# Patient Record
Sex: Female | Born: 1960 | Race: White | Hispanic: No | Marital: Married | Smoking: Never smoker
Health system: Southern US, Community
[De-identification: ages and names within clinical notes are randomized; demographics above are authoritative.]

## PROBLEM LIST (undated history)

## (undated) DIAGNOSIS — S83209A Unspecified tear of unspecified meniscus, current injury, unspecified knee, initial encounter: Secondary | ICD-10-CM

## (undated) DIAGNOSIS — D649 Anemia, unspecified: Secondary | ICD-10-CM

## (undated) DIAGNOSIS — T7840XA Allergy, unspecified, initial encounter: Secondary | ICD-10-CM

## (undated) DIAGNOSIS — J9611 Chronic respiratory failure with hypoxia: Secondary | ICD-10-CM

## (undated) DIAGNOSIS — K5900 Constipation, unspecified: Secondary | ICD-10-CM

## (undated) DIAGNOSIS — G4700 Insomnia, unspecified: Secondary | ICD-10-CM

## (undated) HISTORY — DX: Constipation, unspecified: K59.00

## (undated) HISTORY — DX: Anemia, unspecified: D64.9

## (undated) HISTORY — DX: Allergy, unspecified, initial encounter: T78.40XA

## (undated) HISTORY — DX: Chronic respiratory failure with hypoxia: J96.11

---

## 1999-05-01 HISTORY — PX: BREAST FIBROADENOMA SURGERY: SHX580

## 1999-11-21 ENCOUNTER — Encounter: Admission: RE | Admit: 1999-11-21 | Discharge: 1999-11-21 | Payer: Self-pay | Admitting: Internal Medicine

## 1999-12-07 ENCOUNTER — Ambulatory Visit (HOSPITAL_COMMUNITY): Admission: RE | Admit: 1999-12-07 | Discharge: 1999-12-07 | Payer: Self-pay | Admitting: General Surgery

## 1999-12-07 ENCOUNTER — Encounter: Payer: Self-pay | Admitting: General Surgery

## 1999-12-07 ENCOUNTER — Encounter (INDEPENDENT_AMBULATORY_CARE_PROVIDER_SITE_OTHER): Payer: Self-pay | Admitting: *Deleted

## 2004-04-30 HISTORY — PX: HYSTEROSCOPY WITH RESECTOSCOPE: SHX5395

## 2005-02-07 ENCOUNTER — Ambulatory Visit (HOSPITAL_COMMUNITY): Admission: RE | Admit: 2005-02-07 | Discharge: 2005-02-07 | Payer: Self-pay | Admitting: Obstetrics and Gynecology

## 2005-02-07 ENCOUNTER — Encounter (INDEPENDENT_AMBULATORY_CARE_PROVIDER_SITE_OTHER): Payer: Self-pay | Admitting: *Deleted

## 2007-05-01 HISTORY — PX: BILATERAL SALPINGOOPHORECTOMY: SHX1223

## 2007-07-31 ENCOUNTER — Ambulatory Visit (HOSPITAL_COMMUNITY): Admission: RE | Admit: 2007-07-31 | Discharge: 2007-08-01 | Payer: Self-pay | Admitting: Obstetrics and Gynecology

## 2007-07-31 ENCOUNTER — Encounter (INDEPENDENT_AMBULATORY_CARE_PROVIDER_SITE_OTHER): Payer: Self-pay | Admitting: Obstetrics and Gynecology

## 2010-09-12 NOTE — Op Note (Signed)
NAME:  CLANCY, LEINER NO.:  [id number]   MEDICAL RECORD NO.:  [id number]          PATIENT TYPE:  AMB   LOCATION:  SDC                           FACILITY:  [facility]   PHYSICIAN:  Miguel Aschoff, M.D.       DATE OF BIRTH:  Sep 28, 1960   DATE OF PROCEDURE:  07/31/2007  DATE OF DISCHARGE:                               OPERATIVE REPORT   PREOPERATIVE DIAGNOSES:  Complex left adnexal cystic mass of the ovary,  elevation of CA-125.   POSTOPERATIVE DIAGNOSES:  Complex left adnexal cystic mass of the ovary,  elevation of CA-125.   PROCEDURE:  Laparoscopic bilateral salpingo-oophorectomy, pelvic  washings.   SURGEON:  Dr. Miguel Aschoff.   ASSISTANT:  Dr. Lodema Hong   ANESTHESIA:  General.   COMPLICATIONS:  None.   BRIEF HISTORY:  The patient is a 50 year old white female who on  evaluation was noted have a mass in the left lower quadrant.  CA-125  test was performed which reveals a value in the 40s which represented  elevation and due to the complex nature of this mass with the  possibility that this represented an ovarian neoplasm, she is being  brought to the operating room at this time to undergo laparoscopy,  pelvic cytology and bilateral oophorectomy to assess the pelvic mass and  the elevation of the CA-125. The patient understands that there is a  chance that a laparotomy would be necessary. The  risks and benefits of  the procedure were discussed, informed consent has been obtained.   PROCEDURE:  The patient was taken to the operating room, placed in a  supine position.  General anesthesia was administered without  difficulty.  She was then placed in the dorsal lithotomy position,  prepped and draped in the usual sterile fashion.  A Foley catheter was  inserted in the bladder. At this point, attention was directed to the  umbilicus where a small infraumbilical incision was made.  A Veress  needle was inserted and then the abdomen was insufflated with 3 liters  of  CO2.  Following the insufflation, a trocar to laparoscope was placed  followed by the laparoscope itself.  Then under direct visualization, a  10-mm port was established in the left lower quadrant and a 5 mm port in  the right lower quadrant. At this point, systematic inspection revealed  the anterior bladder peritoneum to be unremarkable.  The uterus was  noted be top normal size, slightly irregular in shape but otherwise  unremarkable.  The round ligaments were unremarkable.  No hernias were  noted. On the right side, the tube was traced along its course, the tube  appeared to be normal.  The fimbria were fine and delicate.  The right  ovary appeared to be normal except for it appeared to be a simple  follicular cyst.  The ovary was adherent to the right lateral pelvic  sidewall. On the left side the left tube was within normal limits, the  left ovary was noted to be enlarged approximately 4-5 cm in size.  There  were no external excrescences noted on the  surface of the ovary. There  were  periovarian adhesions noted holding this ovary to the lateral  pelvic sidewall and these were taken down without difficulty.  The  intestinal surfaces were unremarkable.  The appendix was visualized and  was noted to be within normal limits.  The liver was noted to have  multiple at adhesions, banjo string type running from the liver surface  to the anterior abdominal wall. This involved the entire anterior  surface of the liver with these adhesions.  No other abnormalities were  noted in the abdomen or pelvis.  The cul-de-sac appeared to be  unremarkable, there was no evidence of any implants found in the cul-de-  sac or on the bowel or omentum.  Pelvic washings were then obtained and  sent for cytology.  At this point, the gyrus unit was introduced.  The  left utero-ovarian ligament was then found, cauterized and cut, the  ovary was then elevated and the left infundibulopelvic ligament was   identified as was the ureter with the ureter being visualized.  The  infundibulopelvic ligament was cauterized and cut and then dissection  continued along the meso-ovarian ligament and mesosalpinx until the left  ovary was freed in its entirety.  The ovaries then placed in the  anterior cul-de-sac of the pelvis and attention was directed to the  opposite side. The adhesions holding the right ovary to the pelvic  sidewall were then lysed, the ovary was then elevated and in a similar  fashion the infundibulopelvic ligament was cauterized and cut and the  dissection continued along the meso-ovarian ligament and mesosalpinx  until the utero-ovarian ligament was found and this was cauterized and  cut as was the residual portion of fallopian tube and the right ovary  and tube were freed and this was placed in the posterior cul-de-sac.  At  this point,  inspection was made for hemostasis and hemostasis appeared  to be excellent.  An EndoCatch system was then placed through the 10-mm  port.  The left ovary was placed in the bag and then brought out through  the left lower quadrant incision. It was then removed in pieces and sent  for histologic study.  The ovary was not ruptured within the abdomen and  was successfully removed from the EndoCatch system without difficulty.  At this point, a second EndoCatch bag was placed into the abdomen and  the right ovary placed in this EndoCatch bag and removed in a similar  fashion.  This was sent as a separate pathologic specimen.  At this  point, a final inspection was made for hemostasis.  Hemostasis appeared  to be excellent and at this point the CO2 was allowed to escape,  instruments were removed and small incisions were closed using  subcuticular 4-0 Vicryl.  The port sites were then injected with a total  of 10 mL of 0.25% Marcaine. This completed the abdominal portion of the  procedure. The patient had requested that several small moles on her   anterior chest between her breasts be removed. The moles were just  approximately 2 mm in size.  These were easily elevated and shaved off  without difficulty and Band-Aids applied. This completed the procedure.  The patient was reversed from the anesthetic and taken to the recovery  room in satisfactory condition.  Estimated blood loss was minimal.   The plan is for the patient to be observed overnight on 23 hours  observation. The patient will be sent home on  April 3 if in satisfactory  condition. Final diagnosis will await the pathology report on the  ovarian specimens.      Miguel Aschoff, M.D.  Electronically Signed     AR/MEDQ  D:  07/31/2007  T:  07/31/2007  Job:  [id number]

## 2010-09-15 NOTE — Op Note (Signed)
[place]  Patient:    Teresa Wells                       MRN: [id number] Proc. Date: 12/07/99 Attending:  Rose Phi. Maple Hudson, M.D.                           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  OPERATION:  Excision of five separate fibroadenomas of the left breast.  SURGEON:  Dr. Rose Phi. Young.  ANESTHESIA:  General.  DESCRIPTION OF PROCEDURE:  This patient had two palpable fibroadenomas and three nonpalpable ones identified by ultrasound.  The palpable ones were at the 9 oclock and 1 oclock positions along the areolar margins, and the nonpalpable ones were at about the 11 oclock position, the 4 oclock position, and the 6 oclock position further out in the breast tissue.  The nonpalpable ones were localized with ultrasound guidance.  After suitable general anesthesia was induced the patient was placed in the supine position, and the left breast was prepped and draped in the usual fashion.  The two palpable ones were excised by making an incision overlying the palpable lesions and dissecting down through the fibroadenoma.  A suture was placed in it in order to lift it up, and then they were simply excised. The nonpalpable ones had been marked with an X for their approximate locations.  So I made an incision overlying the marked spot, and dissected down, grasped the wire, brought it into the incision, lifted up on the wire, and was able to identify the fibroadenomas which were then placed to ______ and excised.  All five of them were removed successfully.  Made good hemostasis with the cautery.  I infiltrated each incision with 1% Xylocaine with Adrenalin.  The skin was then approximated with interrupted 5-0 Monocryl sutures and Steri-Strips.  Dressing was then applied, and the patient was transferred to recovery room in satisfactory  condition, having tolerated the procedure well. DD:  12/07/99 TD:  12/07/99 Job: [id number]

## 2010-09-15 NOTE — Op Note (Signed)
Teresa Wells, Teresa Wells             ACCOUNT NO.:  [id number]   MEDICAL RECORD NO.:  [id number]          PATIENT TYPE:  AMB   LOCATION:  SDC                           FACILITY:  [facility]   PHYSICIAN:  Lenoard Aden, M.D.DATE OF BIRTH:  28-Mar-1961   DATE OF PROCEDURE:  02/07/2005  DATE OF DISCHARGE:                                 OPERATIVE REPORT   PREOPERATIVE DIAGNOSIS:  Menometrorrhagia with questionable structural  lesion.   POSTOPERATIVE DIAGNOSIS:  Probable submucous fibroid.   PROCEDURE:  Diagnostic hysteroscopy, resectoscopic myomectomy, dilatation  and curettage.   SURGEON:  Lenoard Aden, M.D.   ASSISTANT:  None.   ANESTHESIA:  General.   ESTIMATED BLOOD LOSS:  Less than 50 mL.   COMPLICATIONS:  None.   DRAINS:  None.   COUNTS:  Correct.   FLUID DEFICIT:  50 mL.   Patient to recovery in good condition.   BRIEF OPERATIVE NOTE:  After being apprised of the risks of anesthesia,  infection, bleeding, injury to abdominal organs and need for repair, delayed  versus immediate complications to include bowel and bladder injury noted,  the patient was brought to the operating room, where she was administered a  general anesthetic without complications, prepped and draped in the usual  sterile fashion and catheterized until the bladder is empty.  After  achieving adequate anesthesia, a dilute Pitressin solution is placed at 3  and 9 o'clock at the cervicovaginal junction.  At this time the cervix is  easily dilated up to a #32 Pratt dilator, hysteroscope placed.  Visualization reveals a broad-based fibroid versus polyp, which is along the  left posterior lateral wall of the uterus.  The double-angle loop is used to  resect this in its entirety down to the level of the myometrium.  Good  hemostasis is noted.  Tubal ostia are noted to be normal.  There was a small  polypoid fragment along the right lateral wall, which is resected as well.  D&C is  performed.   Endometrial curettings are collected without difficulty.  The  uterus is revisualized and found to be intact, no evidence of any bleeding.  All instruments removed.  The patient is awakened and transferred to  recovery in good condition.      Lenoard Aden, M.D.  Electronically Signed     RJT/MEDQ  D:  02/07/2005  T:  02/07/2005  Job:  [id number]

## 2010-09-15 NOTE — H&P (Signed)
NAMELAURIEL, HELIN             ACCOUNT NO.:  [id number]   MEDICAL RECORD NO.:  [id number]          PATIENT TYPE:  AMB   LOCATION:  SDC                           [facility]ILITY:  [facility]   PHYSICIAN:  Lenoard Aden, M.D.DATE OF BIRTH:  31-Jul-1960   DATE OF ADMISSION:  02/07/2005  DATE OF DISCHARGE:                                HISTORY & PHYSICAL   CHIEF COMPLAINT:  Dysfunction uterine bleeding.   HISTORY OF PRESENT ILLNESS:  She is a 50 year old female, G1, P0, with  irregular bleeding, normal laboratory workup, and a saline sonohysterography  consistent with a probable structural abnormality.   She takes no medications.   ALLERGIES:  1.  SULFA.  2.  PENICILLIN.   She is a nonsmoker, nondrinker.  She denies domestic or physical violence.   She has a history of breast surgery in April 2001.   FAMILY HISTORY:  Diabetes, hypertension, and heart disease.   PHYSICAL EXAMINATION:  GENERAL:  She is a well developed well nourished  female in no apparent distress.  HEENT:  Normal.  LUNGS:  Clear.  HEART:  Regular rhythm.  ABDOMEN:  Soft, nontender.  PELVIC:  Reveals a anteflexed uterus and no adnexal masses.  Multiple small  intramural fibroids are noted.  EXTREMITIES:  Reveal no cords.  NEUROLOGIC:  Nonfocal.   IMPRESSION:  Dysfunction uterine bleeding with structural lesion.   PLAN:  Treat with diagnostic hysteroscopy, resectoscopic myomectomy,  possible __________.  Risks of anesthesia, infection, bleeding, injury  abdominal organs, __________ discussed.  Delayed versus immediate  complications to include bowel and bladder injury noted.  The patient  acknowledges and wishes to proceed.      Lenoard Aden, M.D.  Electronically Signed    RJT/MEDQ  D:  02/06/2005  T:  02/06/2005  Job:  [id number]

## 2010-09-15 NOTE — Discharge Summary (Signed)
Teresa Wells, Teresa Wells             ACCOUNT NO.:  [id number]   MEDICAL RECORD NO.:  [id number]          PATIENT TYPE:  OIB   LOCATION:  9313                          FACILITY:  [facility]   PHYSICIAN:  Miguel Aschoff, M.D.       DATE OF BIRTH:  07-29-1960   DATE OF ADMISSION:  07/31/2007  DATE OF DISCHARGE:  08/01/2007                               DISCHARGE SUMMARY   ADMISSION DIAGNOSIS:  Complex left adnexal mass.   BRIEF HISTORY:  The patient is a 50 year old white female who on  evaluation on gynecologic examination was noted to have a mass in the  left adnexa.  Ultrasound examination was carried out revealing that the  mass was complex in nature and the patient underwent a CA-125 level that  was noted to be slightly elevated due to the nature of this mass in the  elevated CA-125 was felt that laparoscopy was indicated to rule out an  ovarian neoplasm.  She was therefore brought to the hospital to undergo  laparoscopy and laparotomy if indicated.  A prior discussion was carried  out with the patient as to whether in addition to the left ovary, the  right ovary should be removed at the time of surgery and the patient  requested that this be carried out.  Informed consent was obtained.   HOSPITAL COURSE:  Laboratory studies were obtained, her admission  hemoglobin was 15.4, hematocrit 44.9, and white count 7700.  PT and PTT  were within normal limits as was her chemistry profile.  On July 31, 2007, after general anesthesia, laparoscopy was carried out.  She was  noted again to have this complex left adnexal mass and a bilateral  laparoscopic salpingo-oophorectomy was carried out as well as pelvic  washings.  The patient tolerated these surgical procedure well.  She had  an essentially uncomplicated observation course in the hospital with her  hemoglobin nadir at 12.8.  The pathology report on the oophorectomy  specimens revealed benign ovarian follicle cyst, focal tubal  endosalpingiosis,  right tube and ovary showed a benign corpus luteum  cyst.  No evidence of endometriosis.  Pelvic washings were done and a  ThinPrep and cell block revealed reactive mesothelial cells.  No  evidence of malignant cells.   The patient was discharged home.  Her medications for home included  Tylox every 3 hours as needed for pain.  She was instructed no heavy  lifting, place nothing in vagina, and to return to the office in 4 weeks  for followup examination.      Miguel Aschoff, M.D.  Electronically Signed     AR/MEDQ  D:  08/28/2007  T:  08/29/2007  Job:  [id number]

## 2011-01-23 LAB — COMPREHENSIVE METABOLIC PANEL
ALT: 15
AST: 17
Albumin: 4
Alkaline Phosphatase: 54
BUN: 8
CO2: 29
Calcium: 9.3
Chloride: 102
Creatinine, Ser: 0.73
GFR calc Af Amer: >60
GFR calc non Af Amer: >60
Glucose, Bld: 99
Potassium: 4.3
Sodium: 139
Total Bilirubin: 0.9
Total Protein: 6.6

## 2011-01-23 LAB — CBC
HCT: 36.8
HCT: 44.9
Hemoglobin: 12.8
Hemoglobin: 15.4 — ABNORMAL HIGH
MCHC: 34.4
MCV: 92.3
Platelets: 215
Platelets: 248
RBC: 4.86
RDW: 12.7
RDW: 12.9
WBC: 15.6 — ABNORMAL HIGH
WBC: 7.7

## 2011-01-23 LAB — PREGNANCY, URINE: Preg Test, Ur: NEGATIVE

## 2011-01-23 LAB — APTT: aPTT: 26

## 2011-04-30 ENCOUNTER — Encounter (HOSPITAL_BASED_OUTPATIENT_CLINIC_OR_DEPARTMENT_OTHER): Payer: Self-pay | Admitting: *Deleted

## 2011-04-30 NOTE — Progress Notes (Signed)
NPO AFTER MN WITH EXCEPTION CLEAR LIQUIDS (NO CREAM/ MILK PRODUCTS) UNTIL 0930. ARRIVES AT 1130. NEEDS HG.

## 2011-05-04 ENCOUNTER — Encounter (HOSPITAL_BASED_OUTPATIENT_CLINIC_OR_DEPARTMENT_OTHER): Payer: Self-pay | Admitting: Anesthesiology

## 2011-05-04 ENCOUNTER — Encounter (HOSPITAL_BASED_OUTPATIENT_CLINIC_OR_DEPARTMENT_OTHER)
Admission: RE | Disposition: A | Discharge status: Home / Self Care | Payer: Self-pay | Source: Ambulatory Visit | Attending: Specialist

## 2011-05-04 ENCOUNTER — Ambulatory Visit (HOSPITAL_BASED_OUTPATIENT_CLINIC_OR_DEPARTMENT_OTHER): Payer: Worker's Compensation | Admitting: Anesthesiology

## 2011-05-04 ENCOUNTER — Ambulatory Visit (HOSPITAL_BASED_OUTPATIENT_CLINIC_OR_DEPARTMENT_OTHER)
Admission: RE | Admit: 2011-05-04 | Discharge: 2011-05-04 | Disposition: A | Discharge status: Home / Self Care | Payer: Worker's Compensation | Source: Ambulatory Visit | Attending: Specialist | Admitting: Specialist

## 2011-05-04 ENCOUNTER — Encounter (HOSPITAL_BASED_OUTPATIENT_CLINIC_OR_DEPARTMENT_OTHER): Payer: Self-pay | Admitting: *Deleted

## 2011-05-04 DIAGNOSIS — X58XXXA Exposure to other specified factors, initial encounter: Secondary | ICD-10-CM | POA: Insufficient documentation

## 2011-05-04 DIAGNOSIS — Z79899 Other long term (current) drug therapy: Secondary | ICD-10-CM | POA: Insufficient documentation

## 2011-05-04 DIAGNOSIS — S83209A Unspecified tear of unspecified meniscus, current injury, unspecified knee, initial encounter: Secondary | ICD-10-CM

## 2011-05-04 DIAGNOSIS — IMO0002 Reserved for concepts with insufficient information to code with codable children: Secondary | ICD-10-CM | POA: Insufficient documentation

## 2011-05-04 DIAGNOSIS — M1710 Unilateral primary osteoarthritis, unspecified knee: Secondary | ICD-10-CM | POA: Insufficient documentation

## 2011-05-04 HISTORY — PX: MENISCUS DEBRIDEMENT: SHX5178

## 2011-05-04 HISTORY — PX: KNEE ARTHROSCOPY: SHX127

## 2011-05-04 HISTORY — DX: Insomnia, unspecified: G47.00

## 2011-05-04 HISTORY — DX: Unspecified tear of unspecified meniscus, current injury, unspecified knee, initial encounter: S83.209A

## 2011-05-04 SURGERY — ARTHROSCOPY, KNEE
Anesthesia: General | Site: Knee | Laterality: Left | Wound class: Clean

## 2011-05-04 MED ORDER — LIDOCAINE HCL (CARDIAC) 20 MG/ML IV SOLN
INTRAVENOUS | Status: DC | PRN
  Administered 2011-05-04: 80 mg via INTRAVENOUS

## 2011-05-04 MED ORDER — FENTANYL CITRATE 0.05 MG/ML IJ SOLN
25.0000 ug | INTRAMUSCULAR | Status: DC | PRN
  Administered 2011-05-04: 25 ug via INTRAVENOUS

## 2011-05-04 MED ORDER — DEXAMETHASONE SODIUM PHOSPHATE 4 MG/ML IJ SOLN
INTRAMUSCULAR | Status: DC | PRN
  Administered 2011-05-04: 8 mg via INTRAVENOUS

## 2011-05-04 MED ORDER — LACTATED RINGERS IV SOLN
INTRAVENOUS | Status: DC
  Administered 2011-05-04 (×2): via INTRAVENOUS
  Administered 2011-05-04: 100 mL/h via INTRAVENOUS

## 2011-05-04 MED ORDER — CHLORHEXIDINE GLUCONATE 4 % EX LIQD: 60.0000 mL | Freq: Once | CUTANEOUS | Status: DC

## 2011-05-04 MED ORDER — CEFAZOLIN SODIUM 1-5 GM-% IV SOLN
1.0000 g | Freq: Once | INTRAVENOUS | Status: AC
Start: 2011-05-04 — End: 2011-05-04
  Administered 2011-05-04: 1 g via INTRAVENOUS

## 2011-05-04 MED ORDER — ONDANSETRON HCL 4 MG/2ML IJ SOLN
INTRAMUSCULAR | Status: DC | PRN
  Administered 2011-05-04: 4 mg via INTRAVENOUS

## 2011-05-04 MED ORDER — BUPIVACAINE-EPINEPHRINE 0.5% -1:200000 IJ SOLN
INTRAMUSCULAR | Status: DC | PRN
  Administered 2011-05-04: 20 mL

## 2011-05-04 MED ORDER — HYDROCODONE-ACETAMINOPHEN 5-325 MG PO TABS
1.0000 | ORAL_TABLET | Freq: Four times a day (QID) | ORAL | Status: AC | PRN
  Administered 2011-05-04: 1 via ORAL

## 2011-05-04 MED ORDER — ASPIRIN EC 325 MG PO TBEC: 325.0000 mg | DELAYED_RELEASE_TABLET | Freq: Every day | ORAL | Status: AC

## 2011-05-04 MED ORDER — HYDROCODONE-ACETAMINOPHEN 5-325 MG PO TABS: 1.0000 | ORAL_TABLET | ORAL | Status: AC | PRN

## 2011-05-04 MED ORDER — SODIUM CHLORIDE 0.9 % IR SOLN
Status: DC | PRN
  Administered 2011-05-04: 17:00:00

## 2011-05-04 MED ORDER — LACTATED RINGERS IV SOLN: INTRAVENOUS | Status: DC

## 2011-05-04 MED ORDER — FENTANYL CITRATE 0.05 MG/ML IJ SOLN
INTRAMUSCULAR | Status: DC | PRN
  Administered 2011-05-04 (×3): 25 ug via INTRAVENOUS
  Administered 2011-05-04: 50 ug via INTRAVENOUS
  Administered 2011-05-04: 25 ug via INTRAVENOUS

## 2011-05-04 MED ORDER — PROMETHAZINE HCL 25 MG/ML IJ SOLN: 6.2500 mg | INTRAMUSCULAR | Status: DC | PRN

## 2011-05-04 MED ORDER — PROPOFOL 10 MG/ML IV EMUL
INTRAVENOUS | Status: DC | PRN
  Administered 2011-05-04: 200 mg via INTRAVENOUS

## 2011-05-04 SURGICAL SUPPLY — 42 items
BANDAGE ELASTIC 6 VELCRO ST LF (GAUZE/BANDAGES/DRESSINGS) ×2 IMPLANT
BLADE 4.2CUDA (BLADE) IMPLANT
BLADE CUDA SHAVER 3.5 (BLADE) ×2 IMPLANT
BLADE GREAT WHITE 4.2 (BLADE) IMPLANT
BOOTIES KNEE HIGH SLOAN (MISCELLANEOUS) ×2 IMPLANT
CANISTER SUCT LVC 12 LTR MEDI- (MISCELLANEOUS) ×2 IMPLANT
CANISTER SUCTION 1200CC (MISCELLANEOUS) IMPLANT
CANISTER SUCTION 2500CC (MISCELLANEOUS) IMPLANT
CANNULA ACUFLEX KIT 5X76 (CANNULA) IMPLANT
CLOTH BEACON ORANGE TIMEOUT ST (SAFETY) ×2 IMPLANT
CUTTER MENISCUS  4.2MM (BLADE)
CUTTER MENISCUS 4.2MM (BLADE) IMPLANT
DRAPE ARTHROSCOPY W/POUCH 114 (DRAPES) ×2 IMPLANT
DRSG PAD ABDOMINAL 8X10 ST (GAUZE/BANDAGES/DRESSINGS) ×2 IMPLANT
DURAPREP 26ML APPLICATOR (WOUND CARE) ×4 IMPLANT
ELECT REM PT RETURN 9FT ADLT (ELECTROSURGICAL)
ELECTRODE REM PT RTRN 9FT ADLT (ELECTROSURGICAL) IMPLANT
GAUZE SPONGE 4X4 12PLY STRL LF (GAUZE/BANDAGES/DRESSINGS) ×2 IMPLANT
GLOVE BIOGEL M 6.5 STRL (GLOVE) ×2 IMPLANT
GLOVE ECLIPSE 6.0 STRL STRAW (GLOVE) ×2 IMPLANT
GLOVE SURG SS PI 8.0 STRL IVOR (GLOVE) ×2 IMPLANT
GOWN PREVENTION PLUS LG XLONG (DISPOSABLE) ×2 IMPLANT
GOWN STRL REIN XL XLG (GOWN DISPOSABLE) ×2 IMPLANT
IV NS IRRIG 3000ML ARTHROMATIC (IV SOLUTION) ×4 IMPLANT
KNEE WRAP E Z 3 GEL PACK (MISCELLANEOUS) ×2 IMPLANT
MINI VAC (SURGICAL WAND) IMPLANT
NDL SAFETY ECLIPSE 18X1.5 (NEEDLE) ×2 IMPLANT
NEEDLE FILTER BLUNT 18X 1/2SAF (NEEDLE) ×1
NEEDLE FILTER BLUNT 18X1 1/2 (NEEDLE) ×1 IMPLANT
NEEDLE HYPO 18GX1.5 SHARP (NEEDLE) ×2
PACK ARTHROSCOPY DSU (CUSTOM PROCEDURE TRAY) ×2 IMPLANT
PACK BASIN DAY SURGERY FS (CUSTOM PROCEDURE TRAY) ×2 IMPLANT
PADDING CAST COTTON 6X4 STRL (CAST SUPPLIES) ×2 IMPLANT
SET ARTHROSCOPY TUBING (MISCELLANEOUS) ×1
SET ARTHROSCOPY TUBING LN (MISCELLANEOUS) ×1 IMPLANT
SPONGE GAUZE 4X4 12PLY (GAUZE/BANDAGES/DRESSINGS) ×2 IMPLANT
SUT ETHILON 4 0 PS 2 18 (SUTURE) ×2 IMPLANT
SYR 30ML LL (SYRINGE) ×2 IMPLANT
SYRINGE 10CC LL (SYRINGE) IMPLANT
TOWEL OR 17X24 6PK STRL BLUE (TOWEL DISPOSABLE) ×2 IMPLANT
WAND 90 DEG TURBOVAC W/CORD (SURGICAL WAND) IMPLANT
WATER STERILE IRR 500ML POUR (IV SOLUTION) ×2 IMPLANT

## 2011-05-04 NOTE — Anesthesia Procedure Notes (Addendum)
Procedure Name: LMA Insertion Date/Time: 05/04/2011 4:31 PM Performed by: Lorrin Jackson Pre-anesthesia Checklist: Patient identified, Emergency Drugs available, Suction available and Patient being monitored Patient Re-evaluated:Patient Re-evaluated prior to inductionOxygen Delivery Method: Circle System Utilized Preoxygenation: Pre-oxygenation with 100% oxygen Intubation Type: IV induction Ventilation: Mask ventilation without difficulty LMA: LMA inserted LMA Size: 4.0 Number of attempts: 1 Airway Equipment and Method: bite block Placement Confirmation: positive ETCO2 Tube secured with: Tape Dental Injury: Teeth and Oropharynx as per pre-operative assessment  Comments: By Dr. Shireen Quan

## 2011-05-04 NOTE — H&P (Signed)
Teresa Wells is an 51 y.o. female.   Chief Complaint: left knee pain HPI: 50 left knee meniscus tear  Past Medical History  Diagnosis Date  . Insomnia   . Acute meniscal tear of knee LEFT  . Sinus infection 2 WKS AGO--  2 ROUNDS OF ANTIBIOTICS-- STATES NO FEVER  . Nonproductive cough     Past Surgical History  Procedure Date  . Bilateral salpingoophorectomy 2009    LAPAROSCOPIC-- LEFT ADNEXAL MASS  . Hysteroscopy with resectoscope 2006    MYOMECTOMY  . Breast fibroadenoma surgery 2001    LEFT    History reviewed. No pertinent family history. Social History:  reports that she has never smoked. She has never used smokeless tobacco. She reports that she does not drink alcohol or use illicit drugs.  Allergies:  Allergies  Allergen Reactions  . Penicillins Hives  . Sulfa Antibiotics Hives    Medications Prior to Admission  Medication Dose Route Frequency Provider Last Rate Last Dose  . ceFAZolin (ANCEF) IVPB 1 g/50 mL premix  1 g Intravenous Once [employer]      . chlorhexidine (HIBICLENS) 4 % liquid 4 application  60 mL Topical Once [employer]      . lactated ringers infusion   Intravenous Continuous Jill Side, MD 100 mL/hr at 05/04/11 1415 100 mL/hr at 05/04/11 1415   Medications Prior to Admission  Medication Sig Dispense Refill  . meloxicam (MOBIC) 7.5 MG tablet Take 7.5 mg by mouth daily.        . Multiple Vitamin (MULTIVITAMIN) tablet Take 1 tablet by mouth daily.        Marland Kitchen zolpidem (AMBIEN) 10 MG tablet Take 10 mg by mouth at bedtime as needed.          Results for orders placed during the hospital encounter of 05/04/11 (from the past 48 hour(s))  POCT HEMOGLOBIN-HEMACUE     Status: Abnormal   Collection Time   05/04/11  2:16 PM      Component Value Range Comment   Hemoglobin 16.3 (*) 12.0 - 15.0 (g/dL)    No results found.  Review of Systems  Constitutional: Negative.   HENT: Negative.   Eyes: Negative.   Respiratory: Negative.     Cardiovascular: Negative.   Gastrointestinal: Negative.   Musculoskeletal: Positive for joint pain.  Skin: Negative.   Psychiatric/Behavioral: Negative.     Blood pressure 160/94, pulse 70, temperature 97.5 F (36.4 C), temperature source Oral, resp. rate 18, height 5\' 4"  (1.626 m), weight 77.111 kg (170 lb), SpO2 100.00%. Physical Exam  Constitutional: She appears well-developed.  Eyes: Pupils are equal, round, and reactive to light.  Neck: Normal range of motion.  Cardiovascular: Normal rate.   Respiratory: Effort normal.  GI: Soft.  Musculoskeletal: She exhibits edema and tenderness.       Left knee MJLT, LJLT, +effusion.  Neurological: She is alert.  Skin: Skin is warm.  Psychiatric: She has a normal mood and affect.     Assessment/Plan Left knee DJD, meniscus tear. Plan arthroscopy. Risks discussed.  Yaniel Limbaugh C 05/04/2011, 3:04 PM

## 2011-05-04 NOTE — Anesthesia Postprocedure Evaluation (Signed)
  Anesthesia Post-op Note  Patient: Teresa Wells  Procedure(s) Performed:  ARTHROSCOPY KNEE; KNEE ARTHROSCOPY WITH MEDIAL MENISECTOMY - partial; DEBRIDEMENT OF MENISCUS  Patient Location: PACU  Anesthesia Type: General  Level of Consciousness: oriented and sedated  Airway and Oxygen Therapy: Patient Spontanous Breathing  Post-op Pain: mild  Post-op Assessment: Post-op Vital signs reviewed, Patient's Cardiovascular Status Stable, Respiratory Function Stable and Patent Airway  Post-op Vital Signs: stable  Complications: No apparent anesthesia complications

## 2011-05-04 NOTE — Transfer of Care (Signed)
Immediate Anesthesia Transfer of Care Note  Patient: Teresa Wells  Procedure(s) Performed:  ARTHROSCOPY KNEE; KNEE ARTHROSCOPY WITH MEDIAL MENISECTOMY - partial; DEBRIDEMENT OF MENISCUS  Patient Location: Patient transported to PACU with oxygen via face mask at 6 Liters / Min  Anesthesia Type: General  Level of Consciousness: awake and alert   Airway & Oxygen Therapy: Patient Spontanous Breathing and Patient connected to face mask oxygen Post-op Assessment: Report given to PACU RN and Post -op Vital signs reviewed and stable  Post vital signs: Reviewed and stable  Complications: No apparent anesthesia complications

## 2011-05-04 NOTE — Anesthesia Preprocedure Evaluation (Addendum)
Anesthesia Evaluation  Patient identified by MRN, date of birth, ID band Patient awake    Reviewed: Allergy & Precautions, H&P , NPO status , Patient's Chart, lab work & pertinent test results  Airway Mallampati: II TM Distance: >3 FB Neck ROM: full    Dental No notable dental hx. (+) Teeth Intact and Dental Advidsory Given   Pulmonary neg pulmonary ROS, Current Smoker,  clear to auscultation  Pulmonary exam normal       Cardiovascular Exercise Tolerance: Good neg cardio ROS regular Normal    Neuro/Psych Negative Neurological ROS  Negative Psych ROS   GI/Hepatic negative GI ROS, Neg liver ROS,   Endo/Other  Negative Endocrine ROS  Renal/GU negative Renal ROS  Genitourinary negative   Musculoskeletal   Abdominal Normal abdominal exam  (+)   Peds  Hematology negative hematology ROS (+)   Anesthesia Other Findings   Reproductive/Obstetrics negative OB ROS                          Anesthesia Physical Anesthesia Plan  ASA: I  Anesthesia Plan: General LMA   Post-op Pain Management:    Induction:   Airway Management Planned:   Additional Equipment:   Intra-op Plan:   Post-operative Plan:   Informed Consent: I have reviewed the patients History and Physical, chart, labs and discussed the procedure including the risks, benefits and alternatives for the proposed anesthesia with the patient or authorized representative who has indicated his/her understanding and acceptance.   Dental Advisory Given  Plan Discussed with: CRNA  Anesthesia Plan Comments:         Anesthesia Quick Evaluation

## 2011-05-04 NOTE — Brief Op Note (Signed)
05/04/2011  5:10 PM  PATIENT:  Teresa Wells  51 y.o. female  PRE-OPERATIVE DIAGNOSIS:  MENISCAL TEAR LEFT KNEE  POST-OPERATIVE DIAGNOSIS:  MENISCAL TEAR LEFT KNEE  PROCEDURE:  Procedure(s): ARTHROSCOPY KNEE KNEE ARTHROSCOPY WITH MEDIAL MENISECTOMY DEBRIDEMENT OF MENISCUS  SURGEON:  Surgeon(s): Javier Docker  PHYSICIAN ASSISTANT:   ASSISTANTS: none   ANESTHESIA:   general  EBL:  Total I/O In: 1000 [I.V.:1000] Out: -   BLOOD ADMINISTERED:none  DRAINS: none   LOCAL MEDICATIONS USED:  MARCAINE 20CC  SPECIMEN:  No Specimen  DISPOSITION OF SPECIMEN:  N/A  COUNTS:  YES  TOURNIQUET:  * No tourniquets in log *  DICTATION: .Other Dictation: Dictation Number [id number]  PLAN OF CARE: Discharge to home after PACU  PATIENT DISPOSITION:  PACU - hemodynamically stable.   Delay start of Pharmacological VTE agent (>24hrs) due to surgical blood loss or risk of bleeding:  {YES/NO/NOT APPLICABLE:20182

## 2011-05-07 ENCOUNTER — Encounter (HOSPITAL_BASED_OUTPATIENT_CLINIC_OR_DEPARTMENT_OTHER): Payer: Self-pay | Admitting: Specialist

## 2011-05-07 NOTE — Op Note (Signed)
NAMEHumberto Leep NO.:  [id number]  MEDICAL RECORD NO.:  [id number]  LOCATION:                               FACILITY:  [facility]  PHYSICIAN:  Jene Every, M.D.    DATE OF BIRTH:  03/21/1961  DATE OF PROCEDURE:  05/04/2011 DATE OF DISCHARGE:                              OPERATIVE REPORT   PREOPERATIVE DIAGNOSIS:  Medial meniscus tear of the right knee.  POSTOPERATIVE DIAGNOSIS:  Medial meniscus tear of the right knee.  PROCEDURE PERFORMED:  Left knee arthroscopy, partial medial meniscectomy.  ANESTHESIA:  General.  ASSISTANT:  None.  HISTORY:  A pleasant female __________ meniscal tear refractory to conservative treatment, mechanical symptoms, indicated for partial meniscectomy.  Risks and benefits discussed including bleeding, infection, no change in symptoms or worsening symptoms, need for repeat debridement, DVT, PE, anesthetic complications etc.  TECHNIQUE:  With the patient in supine position, after induction of adequate general anesthesia of 1g Kefzol, left lower extremity was prepped and draped in usual sterile fashion.  A lateral parapatellar portal was fashioned with a #11 blade.  Ingress cannula atraumatically placed.  Irrigant was utilized to insufflate the joint.  Under direct visualization, a medial parapatellar portal was fashioned with a #11 blade after localization with an 18-gauge needle sparing the medial meniscus.  __________ complex tearing in the posterior third of the medial meniscus, unstable.  Introduced the basket and resected approximately one-half of the posterior third, further contoured with 3.5 Cuda shaver to a stable base.  Femoral condyle and tibial plateau was unremarkable as was the rest of the meniscus.  ACL was unremarkable as was the PCL.  Lateral compartment revealed normal femoral condyle, tibial plateau, and meniscus stable to probe palpation without evidence of tear.  Suprapatellar pouch revealed normal  patellofemoral tracking, normal cartilage, gutters were unremarkable.  Reexamined the medial compartment.  No further pathology amenable to arthroscopic intervention and therefore, removed all instrumentation.  Portals were closed with 4- 0 nylon simple sutures.  0.25% Marcaine with epinephrine was infiltrated in the joint.  Wound was dressed sterilely,  awoken without difficulty, and transported to the recovery room in satisfactory condition.  Patient tolerated procedure well.  No complications.  No assistant.     Jene Every, M.D.     Cordelia Pen  D:  05/04/2011  T:  05/05/2011  Job:  [id number]

## 2011-09-11 ENCOUNTER — Ambulatory Visit (INDEPENDENT_AMBULATORY_CARE_PROVIDER_SITE_OTHER): Payer: Worker's Compensation | Admitting: Internal Medicine

## 2011-09-11 ENCOUNTER — Encounter: Payer: Self-pay | Admitting: Internal Medicine

## 2011-09-11 VITALS — BP 147/88 | HR 81 | Temp 97.7°F | Ht 62.25 in | Wt 168.0 lb

## 2011-09-11 DIAGNOSIS — S83209A Unspecified tear of unspecified meniscus, current injury, unspecified knee, initial encounter: Secondary | ICD-10-CM

## 2011-09-11 DIAGNOSIS — N951 Menopausal and female climacteric states: Secondary | ICD-10-CM

## 2011-09-11 DIAGNOSIS — R030 Elevated blood-pressure reading, without diagnosis of hypertension: Secondary | ICD-10-CM

## 2011-09-11 DIAGNOSIS — N83209 Unspecified ovarian cyst, unspecified side: Secondary | ICD-10-CM

## 2011-09-11 DIAGNOSIS — N6009 Solitary cyst of unspecified breast: Secondary | ICD-10-CM

## 2011-09-11 DIAGNOSIS — IMO0001 Reserved for inherently not codable concepts without codable children: Secondary | ICD-10-CM

## 2011-09-11 DIAGNOSIS — Z780 Asymptomatic menopausal state: Secondary | ICD-10-CM

## 2011-09-11 DIAGNOSIS — J309 Allergic rhinitis, unspecified: Secondary | ICD-10-CM

## 2011-09-11 DIAGNOSIS — IMO0002 Reserved for concepts with insufficient information to code with codable children: Secondary | ICD-10-CM

## 2011-09-11 LAB — CBC WITH DIFFERENTIAL/PLATELET
Basophils Absolute: 0.1 10*3/uL (ref 0.0–0.1)
Basophils Relative: 1 % (ref 0–1)
Eosinophils Relative: 2 % (ref 0–5)
HCT: 46.2 % — ABNORMAL HIGH (ref 36.0–46.0)
MCHC: 32.9 g/dL (ref 30.0–36.0)
Monocytes Absolute: 0.7 10*3/uL (ref 0.1–1.0)
Neutro Abs: 5 10*3/uL (ref 1.7–7.7)
RDW: 13.4 % (ref 11.5–15.5)

## 2011-09-11 LAB — LIPID PANEL
HDL: 78 mg/dL (ref >39)
LDL Cholesterol: 183 mg/dL — ABNORMAL HIGH (ref 0–99)
Total CHOL/HDL Ratio: 3.6 Ratio
VLDL: 20 mg/dL (ref 0–40)

## 2011-09-11 LAB — COMPREHENSIVE METABOLIC PANEL
AST: 22 U/L (ref 0–37)
Alkaline Phosphatase: 108 U/L (ref 39–117)
BUN: 11 mg/dL (ref 6–23)
Creat: 0.65 mg/dL (ref 0.50–1.10)
Potassium: 4.7 mEq/L (ref 3.5–5.3)
Total Bilirubin: 0.6 mg/dL (ref 0.3–1.2)

## 2011-09-11 LAB — TSH: TSH: 2.844 u[IU]/mL (ref 0.350–4.500)

## 2011-09-11 NOTE — Progress Notes (Signed)
Here to establish care. C/o night sweats , weight gain, fatigue- hoping for rx for these sypmtoms

## 2011-09-11 NOTE — Patient Instructions (Signed)
Schedule CPe  Labs will be mailed to you 

## 2011-09-12 ENCOUNTER — Telehealth: Payer: Self-pay | Admitting: Internal Medicine

## 2011-09-12 ENCOUNTER — Encounter: Payer: Self-pay | Admitting: Internal Medicine

## 2011-09-12 DIAGNOSIS — E785 Hyperlipidemia, unspecified: Secondary | ICD-10-CM | POA: Insufficient documentation

## 2011-09-12 DIAGNOSIS — S83209A Unspecified tear of unspecified meniscus, current injury, unspecified knee, initial encounter: Secondary | ICD-10-CM | POA: Insufficient documentation

## 2011-09-12 DIAGNOSIS — J309 Allergic rhinitis, unspecified: Secondary | ICD-10-CM | POA: Insufficient documentation

## 2011-09-12 DIAGNOSIS — E782 Mixed hyperlipidemia: Secondary | ICD-10-CM | POA: Insufficient documentation

## 2011-09-12 DIAGNOSIS — N83209 Unspecified ovarian cyst, unspecified side: Secondary | ICD-10-CM | POA: Insufficient documentation

## 2011-09-12 NOTE — Telephone Encounter (Signed)
Teresa Wells  Call this pt that i saw yesterday and tell her that I want her to come in for a nurse visit to check her BP as it was elevaated in office.   Have her come on a day when you are here.  I also need Dr. Duane Lope gyn records and I don't think she signced for them  .  She can sign when she comes in to see you for BP  Thanks  xxxooo

## 2011-09-12 NOTE — Telephone Encounter (Signed)
Copy of labs mailed to pt's home address. 

## 2011-09-12 NOTE — Progress Notes (Signed)
Subjective:    Patient ID: Teresa Wells, female    DOB: 05/29/1960, 51 y.o.   MRN: [id number]  HPI New pt here for first visit.  Former care [place] [facility].  PMH of allergic rhinitis, torn L meniscus S/P recent repair, fibrocystic breast per pt. Report.  Bilateral S/O for ovarian cysts.    Beth reports increasing frequency of hot flushes and night sweats awakening her at night.  She reports night sweats q1-2 hours.   She had bilateral S/O in 2008 for ovarian cysts but has noted a worsening of vasomotor symptoms last several months.  She reports flushes are interfering with her quality of life and is interrrupting sleep to a signficant degree  Denies personal of Fh of breast CA or other GYN CA.  No persona of FH of DVT or PE.  Father had a CVA, no history of MI.  She did have an abnormal pap in the distant past but reports that in 2008-2009 pap was normal.  She has not had a recent pap smear and is overdue for her mammogram  Allergies  Allergen Reactions  . Penicillins Hives  . Sulfa Antibiotics Hives   Past Medical History  Diagnosis Date  . Insomnia   . Acute meniscal tear of knee LEFT  . Sinus infection 2 WKS AGO--  2 ROUNDS OF ANTIBIOTICS-- STATES NO FEVER  . Nonproductive cough   . Abnormal Pap smear   . Breast tumor 2009    benign  . Menopause    Past Surgical History  Procedure Date  . Bilateral salpingoophorectomy 2009    LAPAROSCOPIC-- LEFT ADNEXAL MASS  . Hysteroscopy with resectoscope 2006    MYOMECTOMY  . Knee arthroscopy 05/04/2011    Procedure: ARTHROSCOPY KNEE;  Surgeon: Javier Docker;  Location: [facility];  Service: Orthopedics;  Laterality: Left;  . Meniscus debridement 05/04/2011    Procedure: DEBRIDEMENT OF MENISCUS;  Surgeon: Javier Docker;  Location: [facility];  Service: Orthopedics;  Laterality: Left;  . Breast fibroadenoma surgery 2001    LEFT   History   Social History  . Marital Status: Divorced    Spouse  Name: N/A    Number of Children: N/A  . Years of Education: N/A   Occupational History  . Not on file.   Social History Main Topics  . Smoking status: Never Smoker   . Smokeless tobacco: Never Used  . Alcohol Use: Yes     rare beer  . Drug Use: No  . Sexually Active: Yes    Birth Control/ Protection: Surgical   Other Topics Concern  . Not on file   Social History Narrative  . No narrative on file   Family History  Problem Relation Age of Onset  . Diabetes Mother   . Hypertension Mother   . Heart disease Mother   . Diabetes Father   . Hypertension Father   . Stroke Father   . Heart disease Father   . Heart disease Brother   . Hypertension Brother    Patient Active Problem List  Diagnoses  . Breast cyst  . Meniscus tear  . Allergic rhinitis due to allergen  . Menopause  . Menopausal hot flushes  . Elevated BP  . Ovarian cyst   Current Outpatient Prescriptions on File Prior to Visit  Medication Sig Dispense Refill  . cetirizine (ZYRTEC) 5 MG tablet Take 5 mg by mouth daily as needed.      . meloxicam (MOBIC) 7.5 MG  tablet Take 7.5 mg by mouth daily as needed.       . Multiple Vitamin (MULTIVITAMIN) tablet Take 1 tablet by mouth daily.        Marland Kitchen zolpidem (AMBIEN) 10 MG tablet Take 10 mg by mouth at bedtime as needed.            Review of Systems see HPI   Objective:   Physical Exam Physical Exam  Nursing note and vitals reviewed.  Constitutional: She is oriented to person, place, and time. She appears well-developed and well-nourished.  HENT:  Head: Normocephalic and atraumatic.  Cardiovascular: Normal rate and regular rhythm. Exam reveals no gallop and no friction rub.  No murmur heard.  Pulmonary/Chest: Breath sounds normal. She has no wheezes. She has no rales.  Neurological: She is alert and oriented to person, place, and time.  Skin: Skin is warm and dry.  Psychiatric: She has a normal mood and affect. Her behavior is normal. Ext: no edema          Assessment & Plan:  1)  Vasomotor flushes:  Gave risk/benefit and extensive counseling regarding risk/benefit of HT.  Will need to get current mm and pap smear and labs with lipids before initiating HT.  She is to schedule CPE 2)  Elevated BP  Pt states bp usually normal and goes up when her Knee hurts will recheck at nurse visit in a few weeks and at CPE visit 3)  H/o abnormal pap willl need Dr. Charlott Rakes records 4)  Allergic rhinitis

## 2011-09-19 ENCOUNTER — Ambulatory Visit: Payer: 59 | Admitting: *Deleted

## 2011-09-19 VITALS — BP 134/84 | HR 77

## 2011-09-19 DIAGNOSIS — Z7689 Persons encountering health services in other specified circumstances: Secondary | ICD-10-CM

## 2011-09-19 NOTE — Progress Notes (Signed)
Pt here for BP check only  134/84 and p-77.  She is scheduled for a CPE in 2 weeks with Dr Constance Goltz.

## 2011-09-25 ENCOUNTER — Ambulatory Visit (HOSPITAL_BASED_OUTPATIENT_CLINIC_OR_DEPARTMENT_OTHER)
Admission: RE | Admit: 2011-09-25 | Discharge: 2011-09-25 | Disposition: A | Discharge status: Home / Self Care | Payer: 59 | Source: Ambulatory Visit | Attending: Internal Medicine | Admitting: Internal Medicine

## 2011-09-25 DIAGNOSIS — Z1231 Encounter for screening mammogram for malignant neoplasm of breast: Secondary | ICD-10-CM | POA: Insufficient documentation

## 2011-11-05 ENCOUNTER — Encounter: Payer: Worker's Compensation | Admitting: Internal Medicine

## 2011-11-05 ENCOUNTER — Encounter: Payer: Self-pay | Admitting: Internal Medicine

## 2011-11-05 ENCOUNTER — Ambulatory Visit (INDEPENDENT_AMBULATORY_CARE_PROVIDER_SITE_OTHER): Payer: 59 | Admitting: Internal Medicine

## 2011-11-05 VITALS — BP 134/82 | HR 73 | Temp 97.6°F | Resp 16 | Ht 64.0 in | Wt 170.0 lb

## 2011-11-05 DIAGNOSIS — N83209 Unspecified ovarian cyst, unspecified side: Secondary | ICD-10-CM

## 2011-11-05 DIAGNOSIS — Z0000 Encounter for general adult medical examination without abnormal findings: Secondary | ICD-10-CM

## 2011-11-05 DIAGNOSIS — Z01419 Encounter for gynecological examination (general) (routine) without abnormal findings: Secondary | ICD-10-CM

## 2011-11-05 DIAGNOSIS — E785 Hyperlipidemia, unspecified: Secondary | ICD-10-CM

## 2011-11-05 DIAGNOSIS — R030 Elevated blood-pressure reading, without diagnosis of hypertension: Secondary | ICD-10-CM

## 2011-11-05 DIAGNOSIS — N951 Menopausal and female climacteric states: Secondary | ICD-10-CM

## 2011-11-05 DIAGNOSIS — Z23 Encounter for immunization: Secondary | ICD-10-CM

## 2011-11-05 DIAGNOSIS — Z780 Asymptomatic menopausal state: Secondary | ICD-10-CM

## 2011-11-05 DIAGNOSIS — Z124 Encounter for screening for malignant neoplasm of cervix: Secondary | ICD-10-CM

## 2011-11-05 LAB — POCT URINALYSIS DIPSTICK
Blood, UA: NEGATIVE
Protein, UA: NEGATIVE
Spec Grav, UA: <=1.005
Urobilinogen, UA: NEGATIVE

## 2011-11-05 MED ORDER — TETANUS-DIPHTH-ACELL PERTUSSIS 5-2.5-18.5 LF-MCG/0.5 IM SUSP: 0.5000 mL | Freq: Once | INTRAMUSCULAR | Status: DC

## 2011-11-05 MED ORDER — PROGESTERONE MICRONIZED 100 MG PO CAPS: 100.0000 mg | ORAL_CAPSULE | Freq: Every day | ORAL | Status: DC

## 2011-11-05 MED ORDER — ESTRADIOL 0.05 MG/24HR TD PTTW: MEDICATED_PATCH | TRANSDERMAL | Status: DC

## 2011-11-05 NOTE — Patient Instructions (Addendum)
Take meds as prescribed  See me in 3-4 months  Follow dash diet

## 2011-11-05 NOTE — Progress Notes (Signed)
Subjective:    Patient ID: Teresa Wells, female    DOB: August 16, 1960, 51 y.o.   MRN: [id number]  HPI Teresa Wells is here for comprehensive eval.    She still reports day and nightly vasomotor flushes and would like to start HT  See BP improved now.  She denies chest pain, SOB, or palpitations.  She is concerned about weight gain but has not been able to exercise since her knee surgery.    She reports she has not had a Tetanus in many years.  She has never had a colonoscopy  Allergies  Allergen Reactions  . Penicillins Hives  . Sulfa Antibiotics Hives   Past Medical History  Diagnosis Date  . Insomnia   . Acute meniscal tear of knee LEFT  . Sinus infection 2 WKS AGO--  2 ROUNDS OF ANTIBIOTICS-- STATES NO FEVER  . Nonproductive cough   . Abnormal Pap smear   . Breast tumor 2009    benign  . Menopause    Past Surgical History  Procedure Date  . Bilateral salpingoophorectomy 2009    LAPAROSCOPIC-- LEFT ADNEXAL MASS  . Hysteroscopy with resectoscope 2006    MYOMECTOMY  . Knee arthroscopy 05/04/2011    Procedure: ARTHROSCOPY KNEE;  Surgeon: Javier Docker;  Location: [facility];  Service: Orthopedics;  Laterality: Left;  . Meniscus debridement 05/04/2011    Procedure: DEBRIDEMENT OF MENISCUS;  Surgeon: Javier Docker;  Location: [facility];  Service: Orthopedics;  Laterality: Left;  . Breast fibroadenoma surgery 2001    LEFT   History   Social History  . Marital Status: Divorced    Spouse Name: N/A    Number of Children: N/A  . Years of Education: N/A   Occupational History  . Not on file.   Social History Main Topics  . Smoking status: Never Smoker   . Smokeless tobacco: Never Used  . Alcohol Use: Yes     rare beer  . Drug Use: No  . Sexually Active: Yes    Birth Control/ Protection: Surgical   Other Topics Concern  . Not on file   Social History Narrative  . No narrative on file   Family History  Problem Relation Age of Onset    . Diabetes Mother   . Hypertension Mother   . Heart disease Mother   . Diabetes Father   . Hypertension Father   . Stroke Father   . Heart disease Father   . Heart disease Brother   . Hypertension Brother    Patient Active Problem List  Diagnosis  . Breast cyst  . Meniscus tear  . Allergic rhinitis due to allergen  . Menopause  . Menopausal hot flushes  . Elevated BP  . Ovarian cyst  . Hyperlipidemia   Current Outpatient Prescriptions on File Prior to Visit  Medication Sig Dispense Refill  . acetaminophen (TYLENOL) 500 MG tablet Take 500 mg by mouth every 6 (six) hours as needed.      . cetirizine (ZYRTEC) 5 MG tablet Take 5 mg by mouth daily as needed.      . Multiple Vitamin (MULTIVITAMIN) tablet Take 1 tablet by mouth daily.        Marland Kitchen zolpidem (AMBIEN) 10 MG tablet Take 10 mg by mouth at bedtime as needed.        Marland Kitchen estradiol (VIVELLE-DOT) 0.05 MG/24HR Place one patch on skin.  Change twice a week  8 patch  3  . progesterone (PROMETRIUM) 100 MG  capsule Take 1 capsule (100 mg total) by mouth daily.  30 capsule  3       Review of Systems  Constitutional: Negative.   HENT: Negative.   Eyes: Negative.   Respiratory: Negative.   Cardiovascular: Negative.   Gastrointestinal: Negative.   Genitourinary: Negative.   Musculoskeletal: Negative.   Skin: Negative.   Neurological: Negative.   Hematological: Negative.   Psychiatric/Behavioral: Negative.        Objective:   Physical Exam Physical Exam  Vital signs and nursing note reviewed  Constitutional: She is oriented to person, place, and time. She appears well-developed and well-nourished. She is cooperative.  HENT:  Head: Normocephalic and atraumatic.  Right Ear: Tympanic membrane normal.  Left Ear: Tympanic membrane normal.  Nose: Nose normal.  Mouth/Throat: Oropharynx is clear and moist and mucous membranes are normal. No oropharyngeal exudate or posterior oropharyngeal erythema.  Eyes: Conjunctivae and EOM  are normal. Pupils are equal, round, and reactive to light.  Neck: Neck supple. No JVD present. Carotid bruit is not present. No mass and no thyromegaly present.  Cardiovascular: Regular rhythm, normal heart sounds, intact distal pulses and normal pulses.  Exam reveals no gallop and no friction rub.   No murmur heard. Pulses:      Dorsalis pedis pulses are 2+ on the right side, and 2+ on the left side.  Pulmonary/Chest: Breath sounds normal. She has no wheezes. She has no rhonchi. She has no rales. Right breast exhibits no mass, no nipple discharge and no skin change. Left breast exhibits no mass, no nipple discharge and no skin change.  Abdominal: Soft. Bowel sounds are normal. She exhibits no distension and no mass. There is no hepatosplenomegaly. There is no tenderness. There is no CVA tenderness.  Genitourinary: Rectum normal, vagina normal and uterus normal. Rectal exam shows no mass. Guaiac negative stool. No labial fusion. There is no lesion on the right labia. There is no lesion on the left labia. Cervix exhibits no motion tenderness. Right adnexum displays no mass, no tenderness and no fullness. Left adnexum displays no mass, no tenderness and no fullness. No erythema around the vagina.  Musculoskeletal:       No active synovitis to any joint.    Lymphadenopathy:       Right cervical: No superficial cervical adenopathy present.      Left cervical: No superficial cervical adenopathy present.       Right axillary: No pectoral and no lateral adenopathy present.       Left axillary: No pectoral and no lateral adenopathy present.      Right: No inguinal adenopathy present.       Left: No inguinal adenopathy present.  Neurological: She is alert and oriented to person, place, and time. She has normal strength and normal reflexes. No cranial nerve deficit or sensory deficit. She displays a negative Romberg sign. Coordination and gait normal.  Skin: Skin is warm and dry. No abrasion, no bruising,  no ecchymosis and no rash noted. No cyanosis. Nails show no clubbing.  Psychiatric: She has a normal mood and affect. Her speech is normal and behavior is normal.          Assessment & Plan:   Health Maintenance:  Declines colonoscopy referral.  She would like to have this in the fall when her work schedule is not so hectic.  Advised to get colonoscopy before the end of the year  Elevated BP  Blood pressure normal today.  Will watch  Hyperlipidemia   Framingham risk is 7%  Pt does not want to take meds now.  Gave copy of DASH diet and she would like to follow and will rehceck fasting levels in 6 months  Menopausal flushes. Has signed risk/benefit sheet.  Will start Minivelle and daily prometrium.  See me in 3-4 months for follow up.  Advised if any leg swellingor pain to notify office immediately.    Knee meniscus tear healing well.    Hisotry of L breast cyst excision  Will give TDAP today. Infor given for shingles vaccine.  Will call when ready for colonoscopy  History of ovarian cyst  Bilateral S/O        Assessment & Plan:

## 2011-11-09 ENCOUNTER — Telehealth: Payer: Self-pay | Admitting: *Deleted

## 2011-11-09 NOTE — Telephone Encounter (Signed)
Copy of pap results mailed to pt's home address.  Repeat pap in 1 year per Dr Constance Goltz.

## 2011-11-12 ENCOUNTER — Encounter: Payer: Self-pay | Admitting: Internal Medicine

## 2011-11-14 ENCOUNTER — Telehealth: Payer: Self-pay | Admitting: *Deleted

## 2011-11-14 ENCOUNTER — Other Ambulatory Visit: Payer: Self-pay | Admitting: *Deleted

## 2011-11-14 ENCOUNTER — Telehealth: Payer: Self-pay | Admitting: Internal Medicine

## 2011-11-14 DIAGNOSIS — J302 Other seasonal allergic rhinitis: Secondary | ICD-10-CM

## 2011-11-14 MED ORDER — FLUTICASONE PROPIONATE 50 MCG/ACT NA SUSP: 1.0000 | Freq: Every day | NASAL | Status: DC

## 2011-11-14 NOTE — Telephone Encounter (Signed)
Pt states her sinuses are acting up and she had to get a nasal spray called Fluticasone Propinte (Misspelled) ... She wants to know if it will affect her hormones... If it does not could she get another refill... Please call pt if you have any questions or concerns at [phone]... Thanks

## 2011-11-14 NOTE — Telephone Encounter (Signed)
Pt decided after the fact that she wanted the [facility] RX sent to [facility] in [place].  Called [employer] and voided the RX.

## 2011-11-14 NOTE — Telephone Encounter (Signed)
Pt called stating that her sinuses were "acting up" and she had Flonase but needs a RF.  Dr Constance Goltz notified and order OK to RF with 1 RF.  This was sent to [facility].

## 2012-02-28 ENCOUNTER — Other Ambulatory Visit: Payer: Self-pay | Admitting: Internal Medicine

## 2012-03-10 ENCOUNTER — Encounter: Payer: Self-pay | Admitting: Internal Medicine

## 2012-03-10 ENCOUNTER — Ambulatory Visit (INDEPENDENT_AMBULATORY_CARE_PROVIDER_SITE_OTHER): Payer: 59 | Admitting: Internal Medicine

## 2012-03-10 ENCOUNTER — Ambulatory Visit (HOSPITAL_BASED_OUTPATIENT_CLINIC_OR_DEPARTMENT_OTHER)
Admission: RE | Admit: 2012-03-10 | Discharge: 2012-03-10 | Disposition: A | Discharge status: Home / Self Care | Payer: 59 | Source: Ambulatory Visit | Attending: Internal Medicine | Admitting: Internal Medicine

## 2012-03-10 VITALS — BP 140/76 | HR 67 | Temp 97.3°F | Resp 18 | Ht 63.0 in | Wt 174.0 lb

## 2012-03-10 DIAGNOSIS — R030 Elevated blood-pressure reading, without diagnosis of hypertension: Secondary | ICD-10-CM

## 2012-03-10 DIAGNOSIS — R609 Edema, unspecified: Secondary | ICD-10-CM

## 2012-03-10 DIAGNOSIS — N951 Menopausal and female climacteric states: Secondary | ICD-10-CM

## 2012-03-10 DIAGNOSIS — M7989 Other specified soft tissue disorders: Secondary | ICD-10-CM | POA: Insufficient documentation

## 2012-03-10 DIAGNOSIS — R600 Localized edema: Secondary | ICD-10-CM

## 2012-03-10 DIAGNOSIS — E785 Hyperlipidemia, unspecified: Secondary | ICD-10-CM

## 2012-03-10 DIAGNOSIS — R635 Abnormal weight gain: Secondary | ICD-10-CM

## 2012-03-10 MED ORDER — PROGESTERONE MICRONIZED 100 MG PO CAPS: 100.0000 mg | ORAL_CAPSULE | Freq: Every day | ORAL | Status: DC

## 2012-03-10 MED ORDER — ESTRADIOL 0.05 MG/24HR TD PTTW: MEDICATED_PATCH | TRANSDERMAL | Status: DC

## 2012-03-10 MED ORDER — AZITHROMYCIN 250 MG PO TABS: ORAL_TABLET | ORAL | Status: DC

## 2012-03-10 NOTE — Addendum Note (Signed)
Addended by: Raechel Chute D on: 03/10/2012 09:43 AM   Modules accepted: Orders

## 2012-03-10 NOTE — Progress Notes (Addendum)
Subjective:    Patient ID: Teresa Wells, female    DOB: Jul 02, 1960, 51 y.o.   MRN: [id number]  HPI Pt is here for follow up.  Reports her BP at home has been running "134-136"    Taking HT and reports much less hot flushes.    She has noticed some increased LE edema of her L leg.  She has chronic edema in this leg from prior meniscus surgery but states her swelling has increased last 2 weeks.  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  She hit her shin at work a couple of weeks ago. and still has a lump there  Cough and chest congestion last 2 weeks.  Productive white mucous  No fever no chest pain  She received flu vaccine at work  Allergies  Allergen Reactions  . Penicillins Hives  . Sulfa Antibiotics Hives   Past Medical History  Diagnosis Date  . Insomnia   . Acute meniscal tear of knee LEFT  . Sinus infection 2 WKS AGO--  2 ROUNDS OF ANTIBIOTICS-- STATES NO FEVER  . Nonproductive cough   . Abnormal Pap smear   . Breast tumor 2009    benign  . Menopause    Past Surgical History  Procedure Date  . Bilateral salpingoophorectomy 2009    LAPAROSCOPIC-- LEFT ADNEXAL MASS  . Hysteroscopy with resectoscope 2006    MYOMECTOMY  . Knee arthroscopy 05/04/2011    Procedure: ARTHROSCOPY KNEE;  Surgeon: Javier Docker;  Location: [facility];  Service: Orthopedics;  Laterality: Left;  . Meniscus debridement 05/04/2011    Procedure: DEBRIDEMENT OF MENISCUS;  Surgeon: Javier Docker;  Location: [facility];  Service: Orthopedics;  Laterality: Left;  . Breast fibroadenoma surgery 2001    LEFT   History   Social History  . Marital Status: Divorced    Spouse Name: N/A    Number of Children: N/A  . Years of Education: N/A   Occupational History  . Not on file.   Social History Main Topics  . Smoking status: Never Smoker   . Smokeless tobacco: Never Used  . Alcohol Use: Yes     Comment: rare beer   . Drug Use: No  . Sexually Active: Yes    Birth Control/ Protection: Surgical   Other Topics Concern  . Not on file   Social History Narrative  . No narrative on file   Family History  Problem Relation Age of Onset  . Diabetes Mother   . Hypertension Mother   . Heart disease Mother   . Diabetes Father   . Hypertension Father   . Stroke Father   . Heart disease Father   . Heart disease Brother   . Hypertension Brother    Patient Active Problem List  Diagnosis  . Breast cyst  . Meniscus tear  . Allergic rhinitis due to allergen  . Menopause  . Menopausal hot flushes  . Elevated BP  . Ovarian cyst  . Hyperlipidemia   Current Outpatient Prescriptions on File Prior to Visit  Medication Sig Dispense Refill  . acetaminophen (TYLENOL) 500 MG tablet Take 500 mg by mouth every 6 (six) hours as needed.      . cetirizine (ZYRTEC) 5 MG tablet Take 5 mg by mouth daily as needed.      . fluticasone (FLONASE) 50 MCG/ACT nasal spray Place 1 spray into the nose daily.  1 g  1  . MINIVELLE 0.05 MG/24HR PLACE ONE PATCH ON SKIN. CHANGE TWICE A WEEK  8 patch  0  . Multiple Vitamin (MULTIVITAMIN) tablet Take 1 tablet by mouth daily.        . progesterone (PROMETRIUM) 100 MG capsule TAKE 1 CAPSULE (100 MG TOTAL) BY MOUTH DAILY.  30 capsule  0  . zolpidem (AMBIEN) 10 MG tablet Take 10 mg by mouth at bedtime as needed.         Current Facility-Administered Medications on File Prior to Visit  Medication Dose Route Frequency Provider Last Rate Last Dose  . TDaP (BOOSTRIX) injection 0.5 mL  0.5 mL Intramuscular Once Kendrick Ranch, MD           Review of Systems    see HPI Objective:   Physical Exam Physical Exam  Nursing note and vitals reviewed. BP my exam  138/76 Constitutional: She is oriented to person, place, and time. She appears well-developed and well-nourished.  HENT:  Head: Normocephalic and atraumatic.  Cardiovascular: Normal rate and regular rhythm. Exam reveals  no gallop and no friction rub.  No murmur heard.  Pulmonary/Chest: Breath sounds normal. She has no wheezes. She has no rales. Few scattered rhonchii  Neurological: She is alert and oriented to person, place, and time.  Skin: Skin is warm and dry.  Psychiatric: She has a normal mood and affect. Her behavior is normal.  Ext:  Most swelling seems to be around her L knee area and just below.  Minimal edema of calf  homan's sign neg.  She does have localized nodule in area of trauma of LL leg        Assessment & Plan:  Menopausal flushes  Improved with HT  If any vagina spotting or LE edema she is counseled to call office  Le Edema  Will get venous doppler today.  Localized nodule due to trauma  Bronchitis:  Will give Zpak today  Elevated BP in the past  BP good today  Weight gain/colonoscopy  Will refer to Dr. Kinnie Scales for colonoscopy and weight gain

## 2012-03-10 NOTE — Patient Instructions (Addendum)
To have venous doppler today  Call if needed

## 2012-05-27 ENCOUNTER — Telehealth: Payer: Self-pay | Admitting: Internal Medicine

## 2012-05-27 NOTE — Telephone Encounter (Signed)
Pt would like to have a ZPACK Pt states she has cold in her chest for a while.. Unable to take penicillin.Marland Kitchen Please call into [facility] in [place].Marland Kitchen

## 2012-05-27 NOTE — Telephone Encounter (Signed)
Call pt and advise to go to urgent care  I do not give antibiotics over the phone.  She needs to be seen at Urgent care

## 2012-05-29 DIAGNOSIS — J302 Other seasonal allergic rhinitis: Secondary | ICD-10-CM | POA: Insufficient documentation

## 2013-08-24 ENCOUNTER — Other Ambulatory Visit: Payer: Self-pay | Admitting: Internal Medicine

## 2013-08-24 DIAGNOSIS — Z1231 Encounter for screening mammogram for malignant neoplasm of breast: Secondary | ICD-10-CM

## 2013-09-03 ENCOUNTER — Ambulatory Visit (INDEPENDENT_AMBULATORY_CARE_PROVIDER_SITE_OTHER): Payer: 59 | Admitting: Internal Medicine

## 2013-09-03 ENCOUNTER — Encounter: Payer: Self-pay | Admitting: Internal Medicine

## 2013-09-03 VITALS — BP 134/85 | HR 81 | Temp 98.2°F | Resp 16 | Wt 153.0 lb

## 2013-09-03 DIAGNOSIS — R1909 Other intra-abdominal and pelvic swelling, mass and lump: Secondary | ICD-10-CM

## 2013-09-03 DIAGNOSIS — R1903 Right lower quadrant abdominal swelling, mass and lump: Secondary | ICD-10-CM

## 2013-09-03 LAB — CBC WITH DIFFERENTIAL/PLATELET
BASOS ABS: 0.1 10*3/uL (ref 0.0–0.1)
Basophils Relative: 1 % (ref 0–1)
Eosinophils Absolute: 0.2 10*3/uL (ref 0.0–0.7)
Eosinophils Relative: 3 % (ref 0–5)
HEMATOCRIT: 42.5 % (ref 36.0–46.0)
HEMOGLOBIN: 14.7 g/dL (ref 12.0–15.0)
LYMPHS PCT: 32 % (ref 12–46)
Lymphs Abs: 2.5 10*3/uL (ref 0.7–4.0)
MCH: 30.6 pg (ref 26.0–34.0)
MCHC: 34.6 g/dL (ref 30.0–36.0)
MCV: 88.4 fL (ref 78.0–100.0)
MONO ABS: 0.6 10*3/uL (ref 0.1–1.0)
MONOS PCT: 8 % (ref 3–12)
NEUTROS ABS: 4.4 10*3/uL (ref 1.7–7.7)
Neutrophils Relative %: 56 % (ref 43–77)
Platelets: 301 10*3/uL (ref 150–400)
RBC: 4.81 MIL/uL (ref 3.87–5.11)
RDW: 13.4 % (ref 11.5–15.5)
WBC: 7.9 10*3/uL (ref 4.0–10.5)

## 2013-09-03 LAB — COMPREHENSIVE METABOLIC PANEL
ALT: 17 U/L (ref 0–35)
AST: 17 U/L (ref 0–37)
Albumin: 4.3 g/dL (ref 3.5–5.2)
Alkaline Phosphatase: 69 U/L (ref 39–117)
BUN: 11 mg/dL (ref 6–23)
CO2: 30 mEq/L (ref 19–32)
Calcium: 9 mg/dL (ref 8.4–10.5)
Chloride: 103 mEq/L (ref 96–112)
Creat: 0.74 mg/dL (ref 0.50–1.10)
Glucose, Bld: 66 mg/dL — ABNORMAL LOW (ref 70–99)
Potassium: 4.1 mEq/L (ref 3.5–5.3)
Sodium: 143 mEq/L (ref 135–145)
Total Bilirubin: 0.5 mg/dL (ref 0.2–1.2)
Total Protein: 6.4 g/dL (ref 6.0–8.3)

## 2013-09-03 LAB — AMYLASE: Amylase: 18 U/L (ref 0–105)

## 2013-09-03 NOTE — Patient Instructions (Signed)
Will refer to Dr. Zella Richer general surgery    Stop by xray ot schedule CT    Go to lab today

## 2013-09-03 NOTE — Progress Notes (Signed)
Subjective:    Patient ID: Teresa Wells, female    DOB: [birth year], 53 y.o.   MRN: [id number]  HPI  Teresa Wells is here for acute visit  She has been watching diet, eliminating carbs and exercising frequently with machines and free weights.  She has lost over 20 lbs and is quite happy  4 weeks ago noted Left sided groin bulge.  Moves in and out.  Some discomfort when working out but not now.  Passing flatus,  BM's normal,  appetitie good.  No pain now.  She is S/P hyst BSO  She also has ocasional pain left lower back and hip.  Will radiated down back of left leg to knee  Allergies  Allergen Reactions  . Penicillins Hives  . Sulfa Antibiotics Hives   Past Medical History  Diagnosis Date  . Insomnia   . Acute meniscal tear of knee LEFT  . Sinus infection 2 WKS AGO--  2 ROUNDS OF ANTIBIOTICS-- STATES NO FEVER  . Nonproductive cough   . Abnormal Pap smear   . Breast tumor 2009    benign  . Menopause    Past Surgical History  Procedure Laterality Date  . Bilateral salpingoophorectomy  2009    LAPAROSCOPIC-- LEFT ADNEXAL MASS  . Hysteroscopy with resectoscope  2006    MYOMECTOMY  . Knee arthroscopy  05/04/2011    Procedure: ARTHROSCOPY KNEE;  Surgeon: Johnn Hai;  Location: [place];  Service: Orthopedics;  Laterality: Left;  . Meniscus debridement  05/04/2011    Procedure: [id number] OF MENISCUS;  Surgeon: Johnn Hai;  Location: [place];  Service: Orthopedics;  Laterality: Left;  . Breast fibroadenoma surgery  2001    LEFT   History   Social History  . Marital Status: Divorced    Spouse Name: N/A    Number of Children: N/A  . Years of Education: N/A   Occupational History  . Not on file.   Social History Main Topics  . Smoking status: Never Smoker   . Smokeless tobacco: Never Used  . Alcohol Use: Yes     Comment: rare beer  . Drug Use: No  . Sexual Activity: Yes    Birth Control/ Protection: Surgical   Other Topics  Concern  . Not on file   Social History Narrative  . No narrative on file   Family History  Problem Relation Age of Onset  . Diabetes Mother   . Hypertension Mother   . Heart disease Mother   . Diabetes Father   . Hypertension Father   . Stroke Father   . Heart disease Father   . Heart disease Brother   . Hypertension Brother    Patient Active Problem List   Diagnosis Date Noted  . Breast cyst 09/12/2011  . Meniscus tear 09/12/2011  . Allergic rhinitis due to allergen 09/12/2011  . Menopause 09/12/2011  . Menopausal hot flushes 09/12/2011  . Elevated BP 09/12/2011  . Ovarian cyst 09/12/2011  . Hyperlipidemia 09/12/2011   Current Outpatient Prescriptions on File Prior to Visit  Medication Sig Dispense Refill  . acetaminophen (TYLENOL) 500 MG tablet Take 500 mg by mouth every 6 (six) hours as needed.      . cetirizine (ZYRTEC) 5 MG tablet Take 5 mg by mouth daily as needed.      . Multiple Vitamin (MULTIVITAMIN) tablet Take 1 tablet by mouth daily.        Marland Kitchen estradiol (MINIVELLE) 0.05 MG/24HR Change patch twice  a week  8 patch  5  . fluticasone (FLONASE) 50 MCG/ACT nasal spray Place 1 spray into the nose daily.  1 g  1  . progesterone (PROMETRIUM) 100 MG capsule Take 1 capsule (100 mg total) by mouth daily.  30 capsule  5  . zolpidem (AMBIEN) 10 MG tablet Take 10 mg by mouth at bedtime as needed.         Current Facility-Administered Medications on File Prior to Visit  Medication Dose Route Frequency Provider Last Rate Last Dose  . TDaP (BOOSTRIX) injection 0.5 mL  0.5 mL Intramuscular Once Lanice Shirts, MD          Review of Systems See HPi    Objective:   Physical Exam  Physical Exam  Nursing note and vitals reviewed.  Constitutional: She is oriented to person, place, and time. She appears well-developed and well-nourished.  HENT:  Head: Normocephalic and atraumatic.  Cardiovascular: Normal rate and regular rhythm. Exam reveals no gallop and no friction  rub.  No murmur heard.  Pulmonary/Chest: Breath sounds normal. She has no wheezes. She has no rales.  Abd  Soft NT/ND.  BS+  Inguinal mass easily reducible left .  No peritoneal signs.   No pain to palpation in any area Neurological: She is alert and oriented to person, place, and time. Le  Reflexes 2+ symmetric SLR neg bilaterally Motor 5/5 LE testing  Skin: Skin is warm and dry.  Psychiatric: She has a normal mood and affect. Her behavior is normal.        Assessment & Plan:  Inguinal mass  Suspect left inguinal hernia  Will get labs and CT.  ADvised if pain worsens or becomes continuous or she stops having bowel gas or BM's to get ER eval.  Will refer for surgical opinion   Weight loss intentional  L/S spine pain  Likely M/S advised to stop weight lifting .  NSAid bid for now.  If worse see me in office

## 2013-09-08 ENCOUNTER — Other Ambulatory Visit: Payer: Self-pay | Admitting: *Deleted

## 2013-09-09 ENCOUNTER — Telehealth: Payer: Self-pay | Admitting: *Deleted

## 2013-09-09 ENCOUNTER — Ambulatory Visit (HOSPITAL_BASED_OUTPATIENT_CLINIC_OR_DEPARTMENT_OTHER)
Admission: RE | Admit: 2013-09-09 | Discharge: 2013-09-09 | Disposition: A | Discharge status: Home / Self Care | Payer: 59 | Source: Ambulatory Visit | Attending: Internal Medicine | Admitting: Internal Medicine

## 2013-09-09 ENCOUNTER — Encounter: Payer: Self-pay | Admitting: *Deleted

## 2013-09-09 DIAGNOSIS — R1909 Other intra-abdominal and pelvic swelling, mass and lump: Secondary | ICD-10-CM

## 2013-09-09 DIAGNOSIS — K439 Ventral hernia without obstruction or gangrene: Secondary | ICD-10-CM | POA: Insufficient documentation

## 2013-09-09 MED ORDER — IOHEXOL 300 MG/ML  SOLN
100.0000 mL | Freq: Once | INTRAMUSCULAR | Status: AC | PRN
  Administered 2013-09-09: 100 mL via INTRAVENOUS

## 2013-09-09 NOTE — Telephone Encounter (Signed)
Message copied by Conley Rolls on Wed Sep 09, 2013  2:55 PM ------      Message from: Emi Belfast D      Created: Wed Sep 09, 2013  1:00 PM       Teresa Wells            Call pt and let her know that CT does see a hernia,  She has a cyst in her left kidney and a blood vessel growth in her liver.               Make sure she has appt with General surgery             Give her an appt in 2-3 months with me in office or after her surgey is done            Route back with date of appt ------

## 2013-09-09 NOTE — Telephone Encounter (Signed)
Notified pt of CT results pt made appt for 07/13 at 08:15

## 2013-09-24 ENCOUNTER — Encounter (INDEPENDENT_AMBULATORY_CARE_PROVIDER_SITE_OTHER): Payer: Self-pay | Admitting: General Surgery

## 2013-09-24 ENCOUNTER — Ambulatory Visit (INDEPENDENT_AMBULATORY_CARE_PROVIDER_SITE_OTHER): Payer: 59 | Admitting: General Surgery

## 2013-09-24 DIAGNOSIS — K432 Incisional hernia without obstruction or gangrene: Secondary | ICD-10-CM

## 2013-09-24 NOTE — Progress Notes (Signed)
Patient ID: Teresa Wells, female   DOB: [birth year], 53 y.o.   MRN: [id number]  Chief Complaint  Patient presents with  . hernia    HPI Teresa Wells is a 53 y.o. female.   HPI  She is referred by Dr. Coralyn Mark for further evaluation and treatment of a LLQ abdominal wall hernia.  She has been losing weight and exercising frequently. She noted a painful left lower quadrant abdominal wall nodule just lateral to the superior to her lower transverse scar. CT scan was consistent with a small abdominal wall hernia in this area. The hernia contains fatty tissue. No evidence of bowel in the hernia.  Past Medical History  Diagnosis Date  . Insomnia   . Acute meniscal tear of knee LEFT  . Sinus infection 2 WKS AGO--  2 ROUNDS OF ANTIBIOTICS-- STATES NO FEVER  . Nonproductive cough   . Abnormal Pap smear   . Breast tumor 2009    benign  . Menopause     Past Surgical History  Procedure Laterality Date  . Bilateral salpingoophorectomy  2009    LAPAROSCOPIC-- LEFT ADNEXAL MASS  . Hysteroscopy with resectoscope  2006    MYOMECTOMY  . Knee arthroscopy  05/04/2011    Procedure: ARTHROSCOPY KNEE;  Surgeon: Johnn Hai;  Location: [place];  Service: Orthopedics;  Laterality: Left;  . Meniscus debridement  05/04/2011    Procedure: [id number] OF MENISCUS;  Surgeon: Johnn Hai;  Location: [place];  Service: Orthopedics;  Laterality: Left;  . Breast fibroadenoma surgery  2001    LEFT    Family History  Problem Relation Age of Onset  . Diabetes Mother   . Hypertension Mother   . Heart disease Mother   . Diabetes Father   . Hypertension Father   . Stroke Father   . Heart disease Father   . Heart disease Brother   . Hypertension Brother     Social History History  Substance Use Topics  . Smoking status: Never Smoker   . Smokeless tobacco: Never Used  . Alcohol Use: Yes     Comment: rare beer    Allergies  Allergen Reactions  .  Penicillins Hives  . Sulfa Antibiotics Hives    Current Outpatient Prescriptions  Medication Sig Dispense Refill  . acetaminophen (TYLENOL) 500 MG tablet Take 500 mg by mouth every 6 (six) hours as needed.      . cetirizine (ZYRTEC) 5 MG tablet Take 5 mg by mouth daily as needed.      . Multiple Vitamin (MULTIVITAMIN) tablet Take 1 tablet by mouth daily.        Marland Kitchen zolpidem (AMBIEN) 10 MG tablet Take 10 mg by mouth at bedtime as needed.        Marland Kitchen estradiol (MINIVELLE) 0.05 MG/24HR Change patch twice a week  8 patch  5  . fluticasone (FLONASE) 50 MCG/ACT nasal spray Place 1 spray into the nose daily.  1 g  1  . progesterone (PROMETRIUM) 100 MG capsule Take 1 capsule (100 mg total) by mouth daily.  30 capsule  5   Current Facility-Administered Medications  Medication Dose Route Frequency Provider Last Rate Last Dose  . TDaP (BOOSTRIX) injection 0.5 mL  0.5 mL Intramuscular Once Lanice Shirts, MD        Review of Systems Review of Systems  Constitutional: Negative.   Respiratory: Negative.   Cardiovascular: Positive for leg swelling. Negative for chest pain.  Gastrointestinal: Positive  for abdominal pain and constipation.  Genitourinary: Negative.   Neurological: Negative.   Hematological: Negative.     There were no vitals taken for this visit.  Physical Exam Physical Exam  Constitutional: No distress.  Overweight female.  HENT:  Head: Normocephalic and atraumatic.  Cardiovascular: Normal rate and regular rhythm.   Pulmonary/Chest: Effort normal and breath sounds normal.  Abdominal: Soft.  A lower transverse scars present. On the left side just superior and lateral to the scar is a painful nodule that is reducible in the supine position.  Musculoskeletal: She exhibits no edema.  Neurological: She is alert.  Skin: Skin is warm and dry.  Psychiatric: She has a normal mood and affect. Her behavior is normal.    Data Reviewed CT scan.  Dr. Sharin Mons  note.  Assessment    Symptomatic left lower quadrant incisional hernia. We discussed repair and she is interested in this.     Plan    Left lower quadrant incisional hernia repair with mesh.  I have discussed the procedure, risks, and aftercare. Risks include but are not limited to bleeding, infection, wound healing problems, anesthesia, recurrence, accidental injury to intra-abdominal organs. We also discussed the rare complication of mesh rejection. All questions were answered.        Rhunette Croft Krystie Leiter 09/24/2013, 5:15 PM

## 2013-09-24 NOTE — Patient Instructions (Signed)
[facility] _______[facility]   HERNIA REPAIR: POST OP INSTRUCTIONS  Always review your discharge instruction sheet given to you by the facility where your surgery was performed. IF YOU HAVE DISABILITY OR FAMILY LEAVE FORMS, YOU MUST BRING THEM TO THE OFFICE FOR PROCESSING.   DO NOT GIVE THEM TO YOUR DOCTOR.  1. A  prescription for pain medication may be given to you upon discharge.  Take your pain medication as prescribed, if needed.  If narcotic pain medicine is not needed, then you may take acetaminophen (Tylenol) or ibuprofen (Advil) as needed. 2. Take your usually prescribed medications unless otherwise directed. 3. If you need a refill on your pain medication, please contact your pharmacy.  They will contact our office to request authorization. Prescriptions will not be filled after 5 pm or on week-ends. 4. You should follow a light diet the first 24 hours after arrival home, such as soup and crackers, etc.  Be sure to include lots of fluids daily.  Resume your normal diet the day after surgery. 5. Most patients will experience some swelling and bruising around the area of the hernia repair.  Ice packs and reclining will help.  Swelling and bruising can take several days to resolve.  6. It is common to experience some constipation if taking pain medication after surgery.  Increasing fluid intake and taking a stool softener (such as Colace) will usually help or prevent this problem from occurring.  A mild laxative (Milk of Magnesia or Miralax) should be taken according to package directions if there are no bowel movements after 48 hours. 7. Unless discharge instructions indicate otherwise, you may remove your bandages 72 hours after surgery, and you may shower at that time.  You may have steri-strips (small skin tapes) in place directly over the incision.  These strips should be left on the skin.  If your surgeon used skin glue on the incision, you may shower in 24 hours.  The glue will  flake off over the next 2-3 weeks.  Any sutures or staples will be removed at the office during your follow-up visit. 8. ACTIVITIES:  You may resume regular (light) daily activities beginning the next day-such as daily self-care, walking, climbing stairs-gradually increasing activities as tolerated.  You may have sexual intercourse when it is comfortable.  Refrain from any heavy lifting or straining-nothing over 10 pounds for 6 weeks. a. You may drive when you are no longer taking prescription pain medication, you can comfortably wear a seatbelt, and you can safely maneuver your car and apply brakes. b. RETURN TO WORK:  Light work in one week.  Full duty in 6 weeks.__________________________________________________________ 9. You should see your doctor in the office for a follow-up appointment approximately 2-3 weeks after your surgery.  Make sure that you call for this appointment within a day or two after you arrive home to insure a convenient appointment time. 10. OTHER INSTRUCTIONS:  __________________________________________________________________________________________________________________________________________________________________________________________  WHEN TO CALL YOUR DOCTOR: 1. Fever over 101.0 2. Inability to urinate 3. Nausea and/or vomiting 4. Extreme swelling or bruising 5. Continued bleeding from incision. 6. Increased pain, redness, or drainage from the incision  The clinic staff is available to answer your questions during regular business hours.  Please don't hesitate to call and ask to speak to one of the nurses for clinical concerns.  If you have a medical emergency, go to the nearest emergency room or call 911.  A surgeon from [facility] is always on call at  the hospital   [address] [place], [place], [place]  [address] ?  [address], [place], [place]   [address] [phone] ? [phone] ? FAX [phone] Web site:  [email or web address]

## 2013-10-27 ENCOUNTER — Encounter (HOSPITAL_COMMUNITY): Payer: Self-pay | Admitting: Pharmacy Technician

## 2013-10-27 NOTE — Progress Notes (Signed)
Preop on 11/06/13 at 0800am.  Surgery on 11/12/13.  Need orders in EPIC.  Thank You.

## 2013-10-27 NOTE — Progress Notes (Signed)
Dr Wynelle Link, Please disregard previous fax and inbasket message sent to you on this patient.  This is an error .  Thank You. I apologive for the inconvenience.

## 2013-10-27 NOTE — Progress Notes (Signed)
Need orders in EPIC.  Surgery on 11/12/13.  Preop on 11/06/13 at 0800am.  Thank You.

## 2013-10-28 ENCOUNTER — Ambulatory Visit (INDEPENDENT_AMBULATORY_CARE_PROVIDER_SITE_OTHER): Payer: 59 | Admitting: Internal Medicine

## 2013-10-28 VITALS — BP 164/81 | HR 73 | Resp 16 | Ht 63.5 in | Wt 160.0 lb

## 2013-10-28 DIAGNOSIS — N3000 Acute cystitis without hematuria: Secondary | ICD-10-CM

## 2013-10-28 DIAGNOSIS — N3001 Acute cystitis with hematuria: Secondary | ICD-10-CM

## 2013-10-28 DIAGNOSIS — R319 Hematuria, unspecified: Secondary | ICD-10-CM

## 2013-10-28 LAB — POCT URINALYSIS DIPSTICK
Bilirubin, UA: NEGATIVE
Glucose, UA: NEGATIVE
Ketones, UA: NEGATIVE
NITRITE UA: NEGATIVE
Protein, UA: NEGATIVE
Urobilinogen, UA: 0.2
pH, UA: 5

## 2013-10-28 MED ORDER — CIPROFLOXACIN HCL 500 MG PO TABS: 500.0000 mg | ORAL_TABLET | Freq: Two times a day (BID) | ORAL | Status: DC

## 2013-10-28 NOTE — Progress Notes (Signed)
Subjective:    Patient ID: Teresa Wells, female    DOB: [birth year], 53 y.o.   MRN: [id number]  HPI Teresa Wells is here for acute visit.  Symptoms started over weekend.  Dysuria no fever no flank pain  New relationship now .  She is using condoms.   Allergies  Allergen Reactions  . Penicillins Hives  . Sulfa Antibiotics Hives   Past Medical History  Diagnosis Date  . Insomnia   . Acute meniscal tear of knee LEFT  . Sinus infection 2 WKS AGO--  2 ROUNDS OF ANTIBIOTICS-- STATES NO FEVER  . Nonproductive cough   . Abnormal Pap smear   . Breast tumor 2009    benign  . Menopause    Past Surgical History  Procedure Laterality Date  . Bilateral salpingoophorectomy  2009    LAPAROSCOPIC-- LEFT ADNEXAL MASS  . Hysteroscopy with resectoscope  2006    MYOMECTOMY  . Knee arthroscopy  05/04/2011    Procedure: ARTHROSCOPY KNEE;  Surgeon: Johnn Hai;  Location: [place];  Service: Orthopedics;  Laterality: Left;  . Meniscus debridement  05/04/2011    Procedure: [id number] OF MENISCUS;  Surgeon: Johnn Hai;  Location: [place];  Service: Orthopedics;  Laterality: Left;  . Breast fibroadenoma surgery  2001    LEFT   History   Social History  . Marital Status: Divorced    Spouse Name: N/A    Number of Children: N/A  . Years of Education: N/A   Occupational History  . Not on file.   Social History Main Topics  . Smoking status: Never Smoker   . Smokeless tobacco: Never Used  . Alcohol Use: Yes     Comment: rare beer  . Drug Use: No  . Sexual Activity: Yes    Birth Control/ Protection: Surgical   Other Topics Concern  . Not on file   Social History Narrative  . No narrative on file   Family History  Problem Relation Age of Onset  . Diabetes Mother   . Hypertension Mother   . Heart disease Mother   . Diabetes Father   . Hypertension Father   . Stroke Father   . Heart disease Father   . Heart disease Brother   .  Hypertension Brother    Patient Active Problem List   Diagnosis Date Noted  . Incisional hernia, without obstruction or gangrene 09/24/2013  . Breast cyst 09/12/2011  . Meniscus tear 09/12/2011  . Allergic rhinitis due to allergen 09/12/2011  . Menopause 09/12/2011  . Menopausal hot flushes 09/12/2011  . Elevated BP 09/12/2011  . Ovarian cyst 09/12/2011  . Hyperlipidemia 09/12/2011   Current Outpatient Prescriptions on File Prior to Visit  Medication Sig Dispense Refill  . acetaminophen (TYLENOL) 500 MG tablet Take 1,000 mg by mouth every 6 (six) hours as needed for mild pain or moderate pain.       . cetirizine (ZYRTEC) 5 MG tablet Take 5 mg by mouth daily.       . fluticasone (FLONASE) 50 MCG/ACT nasal spray Place 1 spray into both nostrils daily.      . Multiple Vitamin (MULTIVITAMIN) tablet Take 1 tablet by mouth daily.        . naproxen sodium (ANAPROX) 220 MG tablet Take 220 mg by mouth 2 (two) times daily with a meal.      . PRESCRIPTION MEDICATION       . PRESCRIPTION MEDICATION every other day.  Current Facility-Administered Medications on File Prior to Visit  Medication Dose Route Frequency Provider Last Rate Last Dose  . TDaP (BOOSTRIX) injection 0.5 mL  0.5 mL Intramuscular Once Lanice Shirts, MD           Review of Systems See HPi    Objective:   Physical Exam Physical Exam  Nursing note and vitals reviewed.  Constitutional: She is oriented to person, place, and time. She appears well-developed and well-nourished.  HENT:  Head: Normocephalic and atraumatic.  Cardiovascular: Normal rate and regular rhythm. Exam reveals no gallop and no friction rub.  No murmur heard.  Pulmonary/Chest: Breath sounds normal. She has no wheezes. She has no rales.  Abd  No CVA tenderness  No peritoneal signs Neurological: She is alert and oriented to person, place, and time.  Skin: Skin is warm and dry.  Psychiatric: She has a normal mood and affect. Her behavior is  normal.          Assessment & Plan:  UTI   Cipro 500 mg bid 5 days    Hematuria  Due to above

## 2013-10-31 LAB — CULTURE, URINE COMPREHENSIVE: Colony Count: >=100000

## 2013-11-03 ENCOUNTER — Other Ambulatory Visit (INDEPENDENT_AMBULATORY_CARE_PROVIDER_SITE_OTHER): Payer: Self-pay | Admitting: General Surgery

## 2013-11-04 ENCOUNTER — Telehealth: Payer: Self-pay | Admitting: *Deleted

## 2013-11-04 NOTE — Telephone Encounter (Signed)
Message copied by Gretchen Short on Wed Nov 04, 2013 11:22 AM ------      Message from: Teresa Wells      Created: Tue Nov 03, 2013 10:27 PM       [facility] [name]            Call pt and let her know that her urine culture did grow an E coli bacteria.   Advise her to be sure to take all of the antibiotic ------

## 2013-11-04 NOTE — Telephone Encounter (Signed)
Called pt she adv she has finished abx and no UTI Sx - to follow up in August

## 2013-11-04 NOTE — Patient Instructions (Addendum)
20 Teresa Wells  11/04/2013   Your procedure is scheduled on: Thursday July 16th, 2015  Report to [facility] Main Entrance and follow signs to  [name] at 530 AM.  Call this number if you have problems the morning of surgery [phone]   Remember:  Do not eat food or drink liquids :After Midnight.     Take these medicines the morning of surgery with A SIP OF WATER: zyrtec, flonase nasal spray                               You may not have any metal on your body including hair pins and piercings  Do not wear jewelry, make-up, lotions, powders, or deodorant.   Men may shave face and neck.  Do not bring valuables to the hospital. [place].  Contacts, dentures or bridgework may not be worn into surgery.  Leave suitcase in the car. After surgery it may be brought to your room.  For patients admitted to the hospital, checkout time is 11:00 AM the day of discharge.   Patients discharged the day of surgery will not be allowed to drive home.  Name and phone number of your driver: Teresa Wells boyfriend cell [phone]  Special Instructions: N/A ________________________________________________________________________  [facility] - Preparing for Surgery Before surgery, you can play an important role.  Because skin is not sterile, your skin needs to be as free of germs as possible.  You can reduce the number of germs on your skin by washing with CHG (chlorahexidine gluconate) soap before surgery.  CHG is an antiseptic cleaner which kills germs and bonds with the skin to continue killing germs even after washing. Please DO NOT use if you have an allergy to CHG or antibacterial soaps.  If your skin becomes reddened/irritated stop using the CHG and inform your nurse when you arrive at Short Stay. Do not shave (including legs and underarms) for at least 48 hours prior to the first CHG shower.  You may shave your face/neck. Please follow  these instructions carefully:  1.  Shower with CHG Soap the night before surgery and the  morning of Surgery.  2.  If you choose to wash your hair, wash your hair first as usual with your  normal  shampoo.  3.  After you shampoo, rinse your hair and body thoroughly to remove the  shampoo.                           4.  Use CHG as you would any other liquid soap.  You can apply chg directly  to the skin and wash                       Gently with a scrungie or clean washcloth.  5.  Apply the CHG Soap to your body ONLY FROM THE NECK DOWN.   Do not use on face/ open                           Wound or open sores. Avoid contact with eyes, ears mouth and genitals (private parts).                       Wash face,  Genitals (private parts) with your normal soap.  6.  Wash thoroughly, paying special attention to the area where your surgery  will be performed.  7.  Thoroughly rinse your body with warm water from the neck down.  8.  DO NOT shower/wash with your normal soap after using and rinsing off  the CHG Soap.                9.  Pat yourself dry with a clean towel.            10.  Wear clean pajamas.            11.  Place clean sheets on your bed the night of your first shower and do not  sleep with pets. Day of Surgery : Do not apply any lotions/deodorants the morning of surgery.  Please wear clean clothes to the hospital/surgery center.  FAILURE TO FOLLOW THESE INSTRUCTIONS MAY RESULT IN THE CANCELLATION OF YOUR SURGERY PATIENT SIGNATURE_________________________________  NURSE SIGNATURE__________________________________  ________________________________________________________________________

## 2013-11-06 ENCOUNTER — Encounter (HOSPITAL_COMMUNITY)
Admission: RE | Admit: 2013-11-06 | Discharge: 2013-11-06 | Disposition: A | Discharge status: Home / Self Care | Payer: 59 | Source: Ambulatory Visit | Attending: General Surgery | Admitting: General Surgery

## 2013-11-06 ENCOUNTER — Encounter (HOSPITAL_COMMUNITY): Payer: Self-pay

## 2013-11-06 ENCOUNTER — Encounter (INDEPENDENT_AMBULATORY_CARE_PROVIDER_SITE_OTHER): Payer: Self-pay

## 2013-11-06 DIAGNOSIS — Z01812 Encounter for preprocedural laboratory examination: Secondary | ICD-10-CM | POA: Insufficient documentation

## 2013-11-06 LAB — CBC WITH DIFFERENTIAL/PLATELET
Basophils Absolute: 0.1 10*3/uL (ref 0.0–0.1)
Basophils Relative: 1 % (ref 0–1)
Eosinophils Absolute: 0.6 10*3/uL (ref 0.0–0.7)
Eosinophils Relative: 7 % — ABNORMAL HIGH (ref 0–5)
HEMATOCRIT: 46.4 % — AB (ref 36.0–46.0)
HEMOGLOBIN: 15.4 g/dL — AB (ref 12.0–15.0)
LYMPHS ABS: 2.7 10*3/uL (ref 0.7–4.0)
LYMPHS PCT: 30 % (ref 12–46)
MCH: 31.2 pg (ref 26.0–34.0)
MCHC: 33.2 g/dL (ref 30.0–36.0)
MCV: 94.1 fL (ref 78.0–100.0)
MONO ABS: 0.8 10*3/uL (ref 0.1–1.0)
Monocytes Relative: 9 % (ref 3–12)
Neutro Abs: 4.9 10*3/uL (ref 1.7–7.7)
Neutrophils Relative %: 53 % (ref 43–77)
Platelets: 275 10*3/uL (ref 150–400)
RBC: 4.93 MIL/uL (ref 3.87–5.11)
RDW: 13.6 % (ref 11.5–15.5)
WBC: 9.1 10*3/uL (ref 4.0–10.5)

## 2013-11-06 LAB — PROTIME-INR
INR: 0.94 (ref 0.00–1.49)
Prothrombin Time: 12.6 seconds (ref 11.6–15.2)

## 2013-11-06 LAB — COMPREHENSIVE METABOLIC PANEL
ALT: 24 U/L (ref 0–35)
AST: 24 U/L (ref 0–37)
Albumin: 4.1 g/dL (ref 3.5–5.2)
Alkaline Phosphatase: 103 U/L (ref 39–117)
Anion gap: 12 (ref 5–15)
BILIRUBIN TOTAL: 0.5 mg/dL (ref 0.3–1.2)
BUN: 15 mg/dL (ref 6–23)
CHLORIDE: 100 meq/L (ref 96–112)
CO2: 30 meq/L (ref 19–32)
CREATININE: 0.68 mg/dL (ref 0.50–1.10)
Calcium: 9.7 mg/dL (ref 8.4–10.5)
GLUCOSE: 67 mg/dL — AB (ref 70–99)
Potassium: 4.5 mEq/L (ref 3.7–5.3)
Sodium: 142 mEq/L (ref 137–147)
Total Protein: 7.1 g/dL (ref 6.0–8.3)

## 2013-11-09 ENCOUNTER — Ambulatory Visit: Payer: 59 | Admitting: Internal Medicine

## 2013-11-12 ENCOUNTER — Encounter (HOSPITAL_COMMUNITY): Payer: Self-pay | Admitting: *Deleted

## 2013-11-12 ENCOUNTER — Ambulatory Visit (HOSPITAL_COMMUNITY): Payer: 59 | Admitting: Certified Registered Nurse Anesthetist

## 2013-11-12 ENCOUNTER — Encounter (HOSPITAL_COMMUNITY): Payer: 59 | Admitting: Certified Registered Nurse Anesthetist

## 2013-11-12 ENCOUNTER — Ambulatory Visit (HOSPITAL_COMMUNITY)
Admission: RE | Admit: 2013-11-12 | Discharge: 2013-11-12 | Disposition: A | Discharge status: Home / Self Care | Payer: 59 | Source: Ambulatory Visit | Attending: General Surgery | Admitting: General Surgery

## 2013-11-12 ENCOUNTER — Encounter (HOSPITAL_COMMUNITY)
Admission: RE | Disposition: A | Discharge status: Home / Self Care | Payer: Self-pay | Source: Ambulatory Visit | Attending: General Surgery

## 2013-11-12 DIAGNOSIS — Z880 Allergy status to penicillin: Secondary | ICD-10-CM | POA: Insufficient documentation

## 2013-11-12 DIAGNOSIS — K432 Incisional hernia without obstruction or gangrene: Secondary | ICD-10-CM | POA: Insufficient documentation

## 2013-11-12 DIAGNOSIS — Z79899 Other long term (current) drug therapy: Secondary | ICD-10-CM | POA: Insufficient documentation

## 2013-11-12 DIAGNOSIS — D2370 Other benign neoplasm of skin of unspecified lower limb, including hip: Secondary | ICD-10-CM | POA: Insufficient documentation

## 2013-11-12 DIAGNOSIS — Z882 Allergy status to sulfonamides status: Secondary | ICD-10-CM | POA: Insufficient documentation

## 2013-11-12 DIAGNOSIS — D239 Other benign neoplasm of skin, unspecified: Secondary | ICD-10-CM

## 2013-11-12 HISTORY — PX: MOLE REMOVAL: SHX2046

## 2013-11-12 HISTORY — PX: INCISIONAL HERNIA REPAIR: SHX193

## 2013-11-12 HISTORY — PX: INSERTION OF MESH: SHX5868

## 2013-11-12 SURGERY — REPAIR, HERNIA, INCISIONAL
Anesthesia: General | Site: Thigh

## 2013-11-12 MED ORDER — 0.9 % SODIUM CHLORIDE (POUR BTL) OPTIME
TOPICAL | Status: DC | PRN
  Administered 2013-11-12: 1000 mL

## 2013-11-12 MED ORDER — PROPOFOL 10 MG/ML IV BOLUS
INTRAVENOUS | Status: DC | PRN
  Administered 2013-11-12: 150 mg via INTRAVENOUS

## 2013-11-12 MED ORDER — MEPERIDINE HCL 50 MG/ML IJ SOLN
INTRAMUSCULAR | Status: AC
  Filled 2013-11-12: qty 1

## 2013-11-12 MED ORDER — LIDOCAINE HCL (CARDIAC) 20 MG/ML IV SOLN
INTRAVENOUS | Status: DC | PRN
  Administered 2013-11-12: 80 mg via INTRAVENOUS

## 2013-11-12 MED ORDER — ROCURONIUM BROMIDE 100 MG/10ML IV SOLN
INTRAVENOUS | Status: DC | PRN
  Administered 2013-11-12: 35 mg via INTRAVENOUS

## 2013-11-12 MED ORDER — SODIUM CHLORIDE 0.9 % IJ SOLN: 3.0000 mL | INTRAMUSCULAR | Status: DC | PRN

## 2013-11-12 MED ORDER — FENTANYL CITRATE 0.05 MG/ML IJ SOLN
INTRAMUSCULAR | Status: DC | PRN
  Administered 2013-11-12: 50 ug via INTRAVENOUS
  Administered 2013-11-12: 100 ug via INTRAVENOUS

## 2013-11-12 MED ORDER — FENTANYL CITRATE 0.05 MG/ML IJ SOLN
INTRAMUSCULAR | Status: AC
  Filled 2013-11-12: qty 5

## 2013-11-12 MED ORDER — LIDOCAINE HCL (CARDIAC) 20 MG/ML IV SOLN
INTRAVENOUS | Status: AC
  Filled 2013-11-12: qty 5

## 2013-11-12 MED ORDER — MIDAZOLAM HCL 5 MG/5ML IJ SOLN
INTRAMUSCULAR | Status: DC | PRN
  Administered 2013-11-12: 2 mg via INTRAVENOUS

## 2013-11-12 MED ORDER — GLYCOPYRROLATE 0.2 MG/ML IJ SOLN
INTRAMUSCULAR | Status: DC | PRN
  Administered 2013-11-12: 0.6 mg via INTRAVENOUS

## 2013-11-12 MED ORDER — METOCLOPRAMIDE HCL 5 MG/ML IJ SOLN
INTRAMUSCULAR | Status: DC | PRN
  Administered 2013-11-12: 10 mg via INTRAVENOUS

## 2013-11-12 MED ORDER — MIDAZOLAM HCL 2 MG/2ML IJ SOLN
INTRAMUSCULAR | Status: AC
  Filled 2013-11-12: qty 2

## 2013-11-12 MED ORDER — OXYCODONE HCL 5 MG PO TABS: 5.0000 mg | ORAL_TABLET | ORAL | Status: DC | PRN

## 2013-11-12 MED ORDER — BACITRACIN-NEOMYCIN-POLYMYXIN 400-5-5000 EX OINT
TOPICAL_OINTMENT | CUTANEOUS | Status: AC
  Filled 2013-11-12: qty 1

## 2013-11-12 MED ORDER — DEXAMETHASONE SODIUM PHOSPHATE 10 MG/ML IJ SOLN
INTRAMUSCULAR | Status: DC | PRN
  Administered 2013-11-12: 10 mg via INTRAVENOUS

## 2013-11-12 MED ORDER — KETOROLAC TROMETHAMINE 30 MG/ML IJ SOLN
15.0000 mg | Freq: Once | INTRAMUSCULAR | Status: AC | PRN
  Administered 2013-11-12: 30 mg via INTRAVENOUS

## 2013-11-12 MED ORDER — VANCOMYCIN HCL IN DEXTROSE 1-5 GM/200ML-% IV SOLN
1000.0000 mg | INTRAVENOUS | Status: AC
  Administered 2013-11-12: 1000 mg via INTRAVENOUS

## 2013-11-12 MED ORDER — MEPERIDINE HCL 50 MG/ML IJ SOLN
6.2500 mg | INTRAMUSCULAR | Status: DC | PRN
  Administered 2013-11-12: 12.5 mg via INTRAVENOUS

## 2013-11-12 MED ORDER — PROMETHAZINE HCL 25 MG/ML IJ SOLN: 6.2500 mg | INTRAMUSCULAR | Status: DC | PRN

## 2013-11-12 MED ORDER — DEXAMETHASONE SODIUM PHOSPHATE 10 MG/ML IJ SOLN
INTRAMUSCULAR | Status: AC
  Filled 2013-11-12: qty 1

## 2013-11-12 MED ORDER — BUPIVACAINE-EPINEPHRINE (PF) 0.5% -1:200000 IJ SOLN
INTRAMUSCULAR | Status: DC | PRN
  Administered 2013-11-12: 13 mL

## 2013-11-12 MED ORDER — METOCLOPRAMIDE HCL 5 MG/ML IJ SOLN
INTRAMUSCULAR | Status: AC
  Filled 2013-11-12: qty 2

## 2013-11-12 MED ORDER — PROPOFOL 10 MG/ML IV BOLUS
INTRAVENOUS | Status: AC
  Filled 2013-11-12: qty 20

## 2013-11-12 MED ORDER — LACTATED RINGERS IV SOLN
INTRAVENOUS | Status: DC | PRN
  Administered 2013-11-12: 07:00:00 via INTRAVENOUS

## 2013-11-12 MED ORDER — BACITRACIN-NEOMYCIN-POLYMYXIN OINTMENT TUBE
TOPICAL_OINTMENT | CUTANEOUS | Status: DC | PRN
  Administered 2013-11-12: 1 via TOPICAL

## 2013-11-12 MED ORDER — KETOROLAC TROMETHAMINE 30 MG/ML IJ SOLN
INTRAMUSCULAR | Status: AC
  Filled 2013-11-12: qty 1

## 2013-11-12 MED ORDER — PHENYLEPHRINE 40 MCG/ML (10ML) SYRINGE FOR IV PUSH (FOR BLOOD PRESSURE SUPPORT)
PREFILLED_SYRINGE | INTRAVENOUS | Status: AC
  Filled 2013-11-12: qty 20

## 2013-11-12 MED ORDER — VANCOMYCIN HCL IN DEXTROSE 1-5 GM/200ML-% IV SOLN
INTRAVENOUS | Status: AC
  Filled 2013-11-12: qty 200

## 2013-11-12 MED ORDER — ONDANSETRON HCL 4 MG/2ML IJ SOLN
INTRAMUSCULAR | Status: DC | PRN
  Administered 2013-11-12: 4 mg via INTRAVENOUS

## 2013-11-12 MED ORDER — HYDROMORPHONE HCL PF 1 MG/ML IJ SOLN: 0.2500 mg | INTRAMUSCULAR | Status: DC | PRN

## 2013-11-12 MED ORDER — BUPIVACAINE-EPINEPHRINE (PF) 0.5% -1:200000 IJ SOLN
INTRAMUSCULAR | Status: AC
  Filled 2013-11-12: qty 30

## 2013-11-12 MED ORDER — NEOSTIGMINE METHYLSULFATE 10 MG/10ML IV SOLN
INTRAVENOUS | Status: DC | PRN
  Administered 2013-11-12: 5 mg via INTRAVENOUS

## 2013-11-12 SURGICAL SUPPLY — 41 items
BENZOIN TINCTURE PRP APPL 2/3 (GAUZE/BANDAGES/DRESSINGS) ×4 IMPLANT
BINDER ABDOMINAL 12 ML 46-62 (SOFTGOODS) IMPLANT
BLADE HEX COATED 2.75 (ELECTRODE) ×4 IMPLANT
CANISTER SUCTION 2500CC (MISCELLANEOUS) ×4 IMPLANT
CLOSURE WOUND 1/2 X4 (GAUZE/BANDAGES/DRESSINGS) ×1
DRAIN CHANNEL RND F F (WOUND CARE) IMPLANT
DRAPE INCISE IOBAN 66X45 STRL (DRAPES) ×4 IMPLANT
DRAPE LAPAROSCOPIC ABDOMINAL (DRAPES) ×4 IMPLANT
DRSG PAD ABDOMINAL 8X10 ST (GAUZE/BANDAGES/DRESSINGS) IMPLANT
DRSG TEGADERM 4X4.75 (GAUZE/BANDAGES/DRESSINGS) ×4 IMPLANT
ELECT REM PT RETURN 9FT ADLT (ELECTROSURGICAL) ×4
ELECTRODE REM PT RTRN 9FT ADLT (ELECTROSURGICAL) ×2 IMPLANT
EVACUATOR SILICONE 100CC (DRAIN) IMPLANT
GAUZE SPONGE 4X4 12PLY STRL (GAUZE/BANDAGES/DRESSINGS) ×4 IMPLANT
GLOVE BIOGEL PI IND STRL 7.0 (GLOVE) ×2 IMPLANT
GLOVE BIOGEL PI INDICATOR 7.0 (GLOVE) ×2
GLOVE ECLIPSE 8.0 STRL XLNG CF (GLOVE) ×4 IMPLANT
GLOVE INDICATOR 8.0 STRL GRN (GLOVE) ×8 IMPLANT
GOWN STRL REUS W/TWL LRG LVL3 (GOWN DISPOSABLE) ×4 IMPLANT
GOWN STRL REUS W/TWL XL LVL3 (GOWN DISPOSABLE) ×8 IMPLANT
KIT BASIN OR (CUSTOM PROCEDURE TRAY) ×4 IMPLANT
MESH BARD SOFT 3X6IN (Mesh General) ×4 IMPLANT
NEEDLE HYPO 25X1 1.5 SAFETY (NEEDLE) ×4 IMPLANT
NS IRRIG 1000ML POUR BTL (IV SOLUTION) ×4 IMPLANT
PACK GENERAL/GYN (CUSTOM PROCEDURE TRAY) ×4 IMPLANT
SPONGE GAUZE 4X4 12PLY (GAUZE/BANDAGES/DRESSINGS) ×3 IMPLANT
SPONGE LAP 4X18 X RAY DECT (DISPOSABLE) IMPLANT
STAPLER VISISTAT 35W (STAPLE) ×4 IMPLANT
STRIP CLOSURE SKIN 1/2X4 (GAUZE/BANDAGES/DRESSINGS) ×3 IMPLANT
SUT ETHILON 3 0 PS 1 (SUTURE) IMPLANT
SUT MNCRL AB 3-0 PS2 18 (SUTURE) ×4 IMPLANT
SUT NOVA 1 T20/GS 25DT (SUTURE) ×4 IMPLANT
SUT NOVA NAB GS-21 0 18 T12 DT (SUTURE) IMPLANT
SUT PDS AB 1 CTX 36 (SUTURE) IMPLANT
SUT PROLENE 0 CT 2 (SUTURE) ×4 IMPLANT
SUT VIC AB 2-0 CT1 27 (SUTURE)
SUT VIC AB 2-0 CT1 TAPERPNT 27 (SUTURE) IMPLANT
SUT VIC AB 2-0 SH 27 (SUTURE) ×2
SUT VIC AB 2-0 SH 27X BRD (SUTURE) ×2 IMPLANT
TOWEL OR 17X26 10 PK STRL BLUE (TOWEL DISPOSABLE) ×4 IMPLANT
TRAY FOLEY CATH 14FRSI W/METER (CATHETERS) IMPLANT

## 2013-11-12 NOTE — Anesthesia Preprocedure Evaluation (Signed)
Anesthesia Evaluation  Patient identified by MRN, date of birth, ID band Patient awake    Reviewed: Allergy & Precautions, H&P , NPO status , Patient's Chart, lab work & pertinent test results  Airway Mallampati: II TM Distance: >3 FB Neck ROM: Full    Dental no notable dental hx.    Pulmonary neg pulmonary ROS,  breath sounds clear to auscultation  Pulmonary exam normal       Cardiovascular negative cardio ROS  Rhythm:Regular Rate:Normal     Neuro/Psych negative neurological ROS  negative psych ROS   GI/Hepatic negative GI ROS, Neg liver ROS,   Endo/Other  negative endocrine ROS  Renal/GU negative Renal ROS  negative genitourinary   Musculoskeletal negative musculoskeletal ROS (+)   Abdominal   Peds negative pediatric ROS (+)  Hematology negative hematology ROS (+)   Anesthesia Other Findings   Reproductive/Obstetrics negative OB ROS                           Anesthesia Physical Anesthesia Plan  ASA: I  Anesthesia Plan: General   Post-op Pain Management:    Induction: Intravenous  Airway Management Planned: Oral ETT and LMA  Additional Equipment:   Intra-op Plan:   Post-operative Plan: Extubation in OR  Informed Consent: I have reviewed the patients History and Physical, chart, labs and discussed the procedure including the risks, benefits and alternatives for the proposed anesthesia with the patient or authorized representative who has indicated his/her understanding and acceptance.   Dental advisory given  Plan Discussed with: CRNA and Surgeon  Anesthesia Plan Comments:         Anesthesia Quick Evaluation

## 2013-11-12 NOTE — Op Note (Signed)
Operative Note  Teresa Wells female 53 y.o. 11/12/2013  PREOPERATIVE DX:  Left lower quadrant abdominal wall hernia. Left thigh mole  POSTOPERATIVE DX:  Left lower quadrant incisional hernia. 1 cm left thigh mole  PROCEDURE:  Repair of left lower quadrant incisional hernia with mesh. Removal of 1 cm left thigh mole.         Surgeon: Odis Hollingshead   Assistants: none  Anesthesia: General endotracheal anesthesia  Indications: this is a 53 year old female who noticed a painful nodule in left lower quadrant area just superior and lateral to previous lower transverse incision. CT scan demonstrated a small hernia with fat extruding from it. She also for me today that she had a left inner thigh mole that was catching on her clothing and was irritating to her. She now presents for the procedures. She understands that her insurance company may not pay for the left thigh mole removal.    Procedure Detail:  She was sewing in the holding area. The left lower quadrant area and the left thigh mole were marked. She is brought to the operating room placed supine on the operating table and general anesthetic was given. The abdominal wall was sterilely prepped and draped.  Local anesthetic (Marcaine) was injected directly over the palpable nodule in the left lower quadrant area. I then made a transverse incision directly over this. I divided and remove some the subcutaneous tissue. The hernia sac was identified and I dissected subcutaneous tissue free from this down to the level of the fascia. The sac was opened up and omentum was noted in the sac. This was reduced back into the peritoneal cavity. The sac was then excised. Subcutaneous tissues and dissected free from the fascia for a distance of 3 cm around the primary defect. The primary defect was closed with interrupted #1 Novafil sutures left long. A piece of a Bard soft polypropylene mesh is brought into the field. The primary repair sutures were  threaded through the mesh and tied down anchoring it directly over the repair. The periphery of the mesh was then anchored to the fascia with a running 0 Prolene suture. This allowed for approximately 3 cm of overlap around the primary repair. Local anesthetic was infiltrated into the fascia. It was inspected and hemostasis was adequate. Scarpa's fascia was closed with running 2-0 Vicryl suture. The subcutaneous tissues closed with running 3-0 Monocryl suture. The skin is closed with running 3-0 Monocryl subcuticular stitch. Steri-Strips and a sterile dressing were applied.  Next, the left inner thigh mole was approached. It was sterilely prepped with Betadine and locally anesthetized with  Marcaine solution. It was then sharply removed. The area was cauterized. Neosporin and a bandage were applied. The specimen was sent to pathology.  She tolerated the procedures well without any apparent complications and was taken to the recovery room in satisfactory condition.   Estimated Blood Loss:  less than 100 mL         Drains: none  Blood Given: none          Specimens: Left thigh mole        Complications:  * No complications entered in OR log *         Disposition: PACU - hemodynamically stable.         Condition: stable

## 2013-11-12 NOTE — Discharge Instructions (Signed)
[facility] _______[facility]  UMBILICAL OR INGUINAL HERNIA REPAIR: POST OP INSTRUCTIONS  Always review your discharge instruction sheet given to you by the facility where your surgery was performed. IF YOU HAVE DISABILITY OR FAMILY LEAVE FORMS, YOU MUST BRING THEM TO THE OFFICE FOR PROCESSING.   DO NOT GIVE THEM TO YOUR DOCTOR.  1. A  prescription for pain medication may be given to you upon discharge.  Take your pain medication as prescribed, if needed.  If narcotic pain medicine is not needed, then you may take acetaminophen (Tylenol) or ibuprofen (Advil) as needed. 2. Take your usually prescribed medications unless otherwise directed. 3. If you need a refill on your pain medication, please contact your pharmacy.  They will contact our office to request authorization. Prescriptions will not be filled after 5 pm or on week-ends. 4. You should follow a light diet the first 24 hours after arrival home, such as soup and crackers, etc.  Be sure to include lots of fluids daily.  Resume your normal diet the day after surgery. 5. Most patients will experience some swelling and bruising around the incisions.  Ice packs and reclining will help.  Swelling and bruising can take many days to resolve.  6. It is common to experience some constipation if taking pain medication after surgery.  Increasing fluid intake and taking a stool softener (such as Colace) will usually help or prevent this problem from occurring.  A mild laxative (Milk of Magnesia or Miralax) should be taken according to package directions if there are no bowel movements after 48 hours. 7. Unless discharge instructions indicate otherwise, you may remove your bandages 72 hours after surgery, and you may shower at that time.  You may have steri-strips (small skin tapes) in place directly over the incision.  These strips should be left on the skin.  If your surgeon used skin glue on the incision, you may shower in 24 hours.  The glue  will flake off over the next 2-3 weeks.  Any sutures or staples will be removed at the office during your follow-up visit. 8. ACTIVITIES:  You may resume regular (light) daily activities beginning the next day--such as daily self-care, walking, climbing stairs--gradually increasing activities as tolerated.  You may have sexual intercourse when it is comfortable.  Refrain from any heavy lifting or straining-nothing over 10 pounds. a. You may drive when you are no longer taking prescription pain medication, you can comfortably wear a seatbelt, and you can safely maneuver your car and apply brakes. b. RETURN TO WORK:  _Light work in 1-2 weeks._________________________________________________________ 9. You should see your doctor in the office for a follow-up appointment approximately 2-3 weeks after your surgery.  Make sure that you call for this appointment within a day or two after you arrive home to insure a convenient appointment time. 10. OTHER INSTRUCTIONS:  __________________________________________________________________________________________________________________________________________________________________________________________  WHEN TO CALL YOUR DOCTOR: 1. Fever over 101.0 2. Inability to urinate 3. Nausea and/or vomiting 4. Extreme swelling or bruising 5. Continued bleeding from incision. 6. Increased pain, redness, or drainage from the incision  The clinic staff is available to answer your questions during regular business hours.  Please dont hesitate to call and ask to speak to one of the nurses for clinical concerns.  If you have a medical emergency, go to the nearest emergency room or call 911.  A surgeon from [facility] is always on call at the hospital   [address] [place],  [place]  [address] ?  [address] [place] [place] [place], [place]   [address] [phone] ? [phone] ? FAX [phone] Web site: [email or web address]

## 2013-11-12 NOTE — H&P (Signed)
Teresa Wells is an 53 y.o. female.   Chief Complaint:   Here for elective surgery HPI:   She noted a painful left lower quadrant abdominal wall nodule just lateral to the superior to her lower transverse scar. CT scan was consistent with a small abdominal wall hernia in this area. The hernia contains fatty tissue. No evidence of bowel in the hernia.  She also has a mole on her left thigh that catches some of her clothing and is irritating to her.  She would like to have this removed.   Past Medical History  Diagnosis Date  . Insomnia   . Acute meniscal tear of knee LEFT  . Sinus infection 6 months ago  . Abnormal Pap smear   . Breast tumor 2009    benign  . Menopause     Past Surgical History  Procedure Laterality Date  . Bilateral salpingoophorectomy  2009    LAPAROSCOPIC-- LEFT ADNEXAL MASS  . Hysteroscopy with resectoscope  2006    MYOMECTOMY  . Knee arthroscopy  05/04/2011    Procedure: ARTHROSCOPY KNEE;  Surgeon: Johnn Hai;  Location: [place];  Service: Orthopedics;  Laterality: Left;  . Meniscus debridement  05/04/2011    Procedure: [id number] OF MENISCUS;  Surgeon: Johnn Hai;  Location: [place];  Service: Orthopedics;  Laterality: Left;  . Breast fibroadenoma surgery  2001    LEFT    Family History  Problem Relation Age of Onset  . Diabetes Mother   . Hypertension Mother   . Heart disease Mother   . Diabetes Father   . Hypertension Father   . Stroke Father   . Heart disease Father   . Heart disease Brother   . Hypertension Brother    Social History:  reports that she has never smoked. She has never used smokeless tobacco. She reports that she drinks alcohol. She reports that she does not use illicit drugs.  Allergies:  Allergies  Allergen Reactions  . Penicillins Hives  . Sulfa Antibiotics Hives    Medications Prior to Admission  Medication Dose Route Frequency Provider Last Rate Last Dose  . TDaP  (BOOSTRIX) injection 0.5 mL  0.5 mL Intramuscular Once Lanice Shirts, MD       Medications Prior to Admission  Medication Sig Dispense Refill  . acetaminophen (TYLENOL) 500 MG tablet Take 1,000 mg by mouth every 6 (six) hours as needed for mild pain or moderate pain.       . cetirizine (ZYRTEC) 5 MG tablet Take 5 mg by mouth daily.       . fluticasone (FLONASE) 50 MCG/ACT nasal spray Place 1 spray into both nostrils daily.      . IODINE, KELP, PO Take by mouth. 1 tab every other day      . Multiple Vitamin (MULTIVITAMIN) tablet Take 1 tablet by mouth daily.        . naproxen sodium (ANAPROX) 220 MG tablet Take 220 mg by mouth 2 (two) times daily with a meal.      . ciprofloxacin (CIPRO) 500 MG tablet Take 1 tablet (500 mg total) by mouth 2 (two) times daily.  10 tablet  0  . PRESCRIPTION MEDICATION       . PRESCRIPTION MEDICATION every other day.        No results found for this or any previous visit (from the past 48 hour(s)). No results found.  Review of Systems  Constitutional: Negative for fever and  chills.    Blood pressure 140/80, pulse 82, temperature 97.7 F (36.5 C), temperature source Oral, resp. rate 16, SpO2 99.00%. Physical Exam  Constitutional: She appears well-developed and well-nourished. No distress.  HENT:  Head: Normocephalic and atraumatic.  Cardiovascular: Normal rate and regular rhythm.   Respiratory: Effort normal and breath sounds normal.  GI: Soft.  LLQ tender nodule just superior and lateral to a lower transverse scar.  Musculoskeletal:  12 mm mole left upper inner thigh  Neurological: She is alert.  Skin: Skin is warm.  tan     Assessment/Plan 1.  LLQ ventral hernia 2.  Left thigh mole  Plan:  Repair of LLQ ventral hernia and removal of left thigh mole.  Peri Kreft J 11/12/2013, 7:32 AM

## 2013-11-12 NOTE — Progress Notes (Signed)
Pt ambulated in hall without any complaints.  She tolerated well.

## 2013-11-12 NOTE — Anesthesia Postprocedure Evaluation (Signed)
  Anesthesia Post-op Note  Patient: Teresa Wells  Procedure(s) Performed: Procedure(s) (LRB): HERNIA REPAIR INCISIONAL (N/A) INSERTION OF MESH (N/A) MOLE REMOVAL LEFT THIGH (Left)  Patient Location: PACU  Anesthesia Type: General  Level of Consciousness: awake and alert   Airway and Oxygen Therapy: Patient Spontanous Breathing  Post-op Pain: mild  Post-op Assessment: Post-op Vital signs reviewed, Patient's Cardiovascular Status Stable, Respiratory Function Stable, Patent Airway and No signs of Nausea or vomiting  Last Vitals:  Filed Vitals:   11/12/13 1020  BP: 113/66  Pulse: 66  Temp: 36.5 C  Resp: 15    Post-op Vital Signs: stable   Complications: No apparent anesthesia complications

## 2013-11-12 NOTE — Transfer of Care (Signed)
Immediate Anesthesia Transfer of Care Note  Patient: Teresa Wells  Procedure(s) Performed: Procedure(s): HERNIA REPAIR INCISIONAL (N/A) INSERTION OF MESH (N/A) MOLE REMOVAL LEFT THIGH (Left)  Patient Location: PACU  Anesthesia Type:General  Level of Consciousness: awake, alert  and oriented  Airway & Oxygen Therapy: Patient Spontanous Breathing and Patient connected to face mask oxygen  Post-op Assessment: Report given to PACU RN and Post -op Vital signs reviewed and stable  Post vital signs: Reviewed and stable  Complications: No apparent anesthesia complications

## 2013-11-13 ENCOUNTER — Telehealth (INDEPENDENT_AMBULATORY_CARE_PROVIDER_SITE_OTHER): Payer: Self-pay

## 2013-11-13 ENCOUNTER — Encounter (HOSPITAL_COMMUNITY): Payer: Self-pay | Admitting: General Surgery

## 2013-11-13 ENCOUNTER — Other Ambulatory Visit (INDEPENDENT_AMBULATORY_CARE_PROVIDER_SITE_OTHER): Payer: Self-pay

## 2013-11-13 MED ORDER — HYDROCODONE-ACETAMINOPHEN 5-325 MG PO TABS: 1.0000 | ORAL_TABLET | ORAL | Status: DC | PRN

## 2013-11-13 NOTE — Telephone Encounter (Signed)
Pt s/p hernia repair on 11/12/13. Pt states that she took her Oxycodone last night and she was up all night. Pt states that this medication usually does this to her. Pt would like to know if Dr Zella Richer would write her Vicodin. Informed pt that I would send this message to Dr Zella Richer and we would contact the patient as soon as we received a response. Pt verbalized understanding.

## 2013-11-13 NOTE — Telephone Encounter (Signed)
Have her stop the Oxycodone.  May have Norco 5/325, 1-2 tabs p.o. q4hrs prn pain, dispense # 40

## 2013-11-13 NOTE — Telephone Encounter (Signed)
Informed pt to stop taking her Oxycodone and Dr Zella Richer wants her to start taking Norco. Rx will be ready for pt to pick up at the front desk. Pt verbalized understanding.

## 2013-11-16 ENCOUNTER — Ambulatory Visit (HOSPITAL_BASED_OUTPATIENT_CLINIC_OR_DEPARTMENT_OTHER): Payer: 59

## 2013-11-16 ENCOUNTER — Encounter: Payer: 59 | Admitting: Internal Medicine

## 2013-12-02 ENCOUNTER — Encounter (INDEPENDENT_AMBULATORY_CARE_PROVIDER_SITE_OTHER): Payer: Self-pay | Admitting: General Surgery

## 2013-12-02 ENCOUNTER — Ambulatory Visit (INDEPENDENT_AMBULATORY_CARE_PROVIDER_SITE_OTHER): Payer: 59 | Admitting: General Surgery

## 2013-12-02 VITALS — BP 130/78 | HR 64 | Resp 16 | Ht 63.0 in | Wt 162.4 lb

## 2013-12-02 DIAGNOSIS — Z4889 Encounter for other specified surgical aftercare: Secondary | ICD-10-CM

## 2013-12-02 NOTE — Progress Notes (Signed)
Procedure:  Left lower quadrant ventral hernia repair with mesh. Removal of left thigh mole  Date:  11/12/2013  Pathology:  Final is a benign fibroepithelial polyp  History:  She is here for her first postoperative visit. She is doing well and has minimal discomfort.  Exam: General- Is in NAD. Abdomen-left lower quadrant incision is clean and intact and solid.  Left thigh wound is clean and intact  Assessment:  Doing well postoperatively.  Plan:  Continue light activities and may resume normal activity 6 weeks after surgery. Return visit prn.

## 2013-12-02 NOTE — Patient Instructions (Signed)
6 weeks after the surgery, may resume normal activities as tolerated, as discussed. 

## 2013-12-23 ENCOUNTER — Ambulatory Visit (INDEPENDENT_AMBULATORY_CARE_PROVIDER_SITE_OTHER): Payer: 59 | Admitting: Internal Medicine

## 2013-12-23 ENCOUNTER — Encounter: Payer: Self-pay | Admitting: Internal Medicine

## 2013-12-23 ENCOUNTER — Ambulatory Visit (HOSPITAL_BASED_OUTPATIENT_CLINIC_OR_DEPARTMENT_OTHER)
Admission: RE | Admit: 2013-12-23 | Discharge: 2013-12-23 | Disposition: A | Discharge status: Home / Self Care | Payer: 59 | Source: Ambulatory Visit | Attending: Internal Medicine | Admitting: Internal Medicine

## 2013-12-23 VITALS — BP 142/80 | HR 66 | Resp 16 | Ht 63.5 in | Wt 163.0 lb

## 2013-12-23 DIAGNOSIS — Z0000 Encounter for general adult medical examination without abnormal findings: Secondary | ICD-10-CM

## 2013-12-23 DIAGNOSIS — R030 Elevated blood-pressure reading, without diagnosis of hypertension: Secondary | ICD-10-CM

## 2013-12-23 DIAGNOSIS — N6009 Solitary cyst of unspecified breast: Secondary | ICD-10-CM

## 2013-12-23 DIAGNOSIS — Z1231 Encounter for screening mammogram for malignant neoplasm of breast: Secondary | ICD-10-CM | POA: Diagnosis not present

## 2013-12-23 DIAGNOSIS — Z1211 Encounter for screening for malignant neoplasm of colon: Secondary | ICD-10-CM

## 2013-12-23 DIAGNOSIS — IMO0001 Reserved for inherently not codable concepts without codable children: Secondary | ICD-10-CM

## 2013-12-23 DIAGNOSIS — E785 Hyperlipidemia, unspecified: Secondary | ICD-10-CM

## 2013-12-23 LAB — POCT URINALYSIS DIPSTICK
Bilirubin, UA: NEGATIVE
Blood, UA: NEGATIVE
Glucose, UA: NEGATIVE
KETONES UA: NEGATIVE
LEUKOCYTES UA: NEGATIVE
NITRITE UA: NEGATIVE
PROTEIN UA: NEGATIVE
Spec Grav, UA: 1.01
Urobilinogen, UA: 0.2
pH, UA: 7

## 2013-12-23 NOTE — Patient Instructions (Addendum)
Call for dermatology appointment  Dr. Delman Cheadle or Dr. Renda Rolls    See me in 8 weeks  30 mins

## 2013-12-23 NOTE — Progress Notes (Signed)
Subjective:    Patient ID: Teresa Wells, female    DOB: [birth year], 53 y.o.   MRN: [id number]  HPI  Teresa Wells is here for CPE.    She is engaged and will be getting married in the spring 2016  HM:  She tells me she had a colonoscopy but I do not see report.  She will get records from home.  S/P HYSt/BSO   MM done today   She is a non-smoker  Recent repair of incisional hernia  Doing well  Hyperlipidemia   See BP  Teresa Wells tells me her BP runs 136-140 at home she is asymptomatic   Wt loss Teresa Wells has lost about 16 lbs with home scales through diet execise and was on HCG for a short time  She has been using tanning bed once a week  Allergies  Allergen Reactions  . Oxycodone   . Penicillins Hives  . Sulfa Antibiotics Hives   Past Medical History  Diagnosis Date  . Insomnia   . Acute meniscal tear of knee LEFT  . Sinus infection 6 months ago  . Abnormal Pap smear   . Breast tumor 2009    benign  . Menopause    Past Surgical History  Procedure Laterality Date  . Bilateral salpingoophorectomy  2009    LAPAROSCOPIC-- LEFT ADNEXAL MASS  . Hysteroscopy with resectoscope  2006    MYOMECTOMY  . Knee arthroscopy  05/04/2011    Procedure: ARTHROSCOPY KNEE;  Surgeon: Johnn Hai;  Location: [place];  Service: Orthopedics;  Laterality: Left;  . Meniscus debridement  05/04/2011    Procedure: CHENIDPOEUM OF MENISCUS;  Surgeon: Johnn Hai;  Location: [place];  Service: Orthopedics;  Laterality: Left;  . Breast fibroadenoma surgery  2001    LEFT  . Incisional hernia repair N/A 11/12/2013    Procedure: HERNIA REPAIR INCISIONAL;  Surgeon: Odis Hollingshead, MD;  Location: [facility];  Service: General;  Laterality: N/A;  . Insertion of mesh N/A 11/12/2013    Procedure: INSERTION OF MESH;  Surgeon: Odis Hollingshead, MD;  Location: [facility];  Service: General;  Laterality: N/A;  . Mole removal Left 11/12/2013    Procedure: MOLE REMOVAL LEFT THIGH;   Surgeon: Odis Hollingshead, MD;  Location: [facility];  Service: General;  Laterality: Left;   History   Social History  . Marital Status: Divorced    Spouse Name: N/A    Number of Children: N/A  . Years of Education: N/A   Occupational History  . Not on file.   Social History Main Topics  . Smoking status: Never Smoker   . Smokeless tobacco: Never Used  . Alcohol Use: Yes     Comment: rare beer  . Drug Use: No  . Sexual Activity: Yes    Birth Control/ Protection: Surgical   Other Topics Concern  . Not on file   Social History Narrative  . No narrative on file   Family History  Problem Relation Age of Onset  . Diabetes Mother   . Hypertension Mother   . Heart disease Mother   . Diabetes Father   . Hypertension Father   . Stroke Father   . Heart disease Father   . Heart disease Brother   . Hypertension Brother    Patient Active Problem List   Diagnosis Date Noted  . Incisional hernia, without obstruction or gangrene 09/24/2013  . Breast cyst 09/12/2011  . Meniscus tear 09/12/2011  .  Allergic rhinitis due to allergen 09/12/2011  . Menopause 09/12/2011  . Menopausal hot flushes 09/12/2011  . Elevated BP 09/12/2011  . Ovarian cyst 09/12/2011  . Hyperlipidemia 09/12/2011   Current Outpatient Prescriptions on File Prior to Visit  Medication Sig Dispense Refill  . acetaminophen (TYLENOL) 500 MG tablet Take 1,000 mg by mouth every 6 (six) hours as needed for mild pain or moderate pain.       . fluticasone (FLONASE) 50 MCG/ACT nasal spray Place 1 spray into both nostrils daily.      . IODINE, KELP, PO Take by mouth. 1 tab every other day      . Multiple Vitamin (MULTIVITAMIN) tablet Take 1 tablet by mouth daily.        Marland Kitchen PRESCRIPTION MEDICATION       . cetirizine (ZYRTEC) 5 MG tablet Take 5 mg by mouth daily.       Marland Kitchen HYDROcodone-acetaminophen (NORCO) 5-325 MG per tablet Take 1-2 tablets by mouth every 4 (four) hours as needed for moderate pain.  40 tablet  0  .  PRESCRIPTION MEDICATION every other day.       Current Facility-Administered Medications on File Prior to Visit  Medication Dose Route Frequency Provider Last Rate Last Dose  . TDaP (BOOSTRIX) injection 0.5 mL  0.5 mL Intramuscular Once Lanice Shirts, MD          Review of Systems     Objective:   Physical Exam  Physical Exam  Nursing note and vitals reviewed.   Repeat BP 142/80 Constitutional: She is oriented to person, place, and time. She appears well-developed and well-nourished.  HENT:  Head: Normocephalic and atraumatic.  Right Ear: Tympanic membrane and ear canal normal. No drainage. Tympanic membrane is not injected and not erythematous.  Left Ear: Tympanic membrane and ear canal normal. No drainage. Tympanic membrane is not injected and not erythematous.  Nose: Nose normal. Right sinus exhibits no maxillary sinus tenderness and no frontal sinus tenderness. Left sinus exhibits no maxillary sinus tenderness and no frontal sinus tenderness.  Mouth/Throat: Oropharynx is clear and moist. No oral lesions. No oropharyngeal exudate.  Eyes: Conjunctivae and EOM are normal. Pupils are equal, round, and reactive to light.  Neck: Normal range of motion. Neck supple. No JVD present. Carotid bruit is not present. No mass and no thyromegaly present.  Cardiovascular: Normal rate, regular rhythm, S1 normal, S2 normal and intact distal pulses. Exam reveals no gallop and no friction rub.  No murmur heard.  Pulses:  Carotid pulses are 2+ on the right side, and 2+ on the left side.  Dorsalis pedis pulses are 2+ on the right side, and 2+ on the left side.  No carotid bruit. No LE edema  Pulmonary/Chest: Breath sounds normal. She has no wheezes. She has no rales. She exhibits no tenderness.   Teresa Wells no discrete mass no nipple discharge no axillary adenopathy bilaterally  Abdominal: Soft. Bowel sounds are normal. She exhibits no distension and no mass. There is no hepatosplenomegaly. There  is no tenderness. There is no CVA tenderness.  Rectal no mass guaiac neg Musculoskeletal: Normal range of motion.  No active synovitis to joints.  Lymphadenopathy:  She has no cervical adenopathy.  She has no axillary adenopathy.  Right: No inguinal and no supraclavicular adenopathy present.  Left: No inguinal and no supraclavicular adenopathy present.  Neurological: She is alert and oriented to person, place, and time. She has normal strength and normal reflexes. She displays no tremor. No cranial  nerve deficit or sensory deficit. Coordination and gait normal.  Skin: Skin is warm and dry. No rash noted. No cyanosis. Nails show no clubbing.  Tanned skin Psychiatric: She has a normal mood and affect. Her speech is normal and behavior is normal. Cognition and memory are normal.        Assessment & Plan:  HM  See scanned sheet counseled lung cancer screening guideline  Pt nonsmoker   S/P  hyst BSO  Hyperlipidemia will check today  Elevated BP  Will recheck in 8 weeks.  Teresa Wells does not want meds right now.  Will recheck her in 8 weeks.  Advised to check BP at home if above 027 systolic she is to see me sooner  Tanning bed use.  Gvien number to Dr. Maurie Boettcher office  She is advised to make appt for skin surveillance  Advised sunscreen and to stop tanning bed use.  H/o breast cyst  Mm done today

## 2013-12-24 LAB — LIPID PANEL
Cholesterol: 244 mg/dL — ABNORMAL HIGH (ref 0–200)
HDL: 71 mg/dL (ref >39)
LDL Cholesterol: 158 mg/dL — ABNORMAL HIGH (ref 0–99)
Total CHOL/HDL Ratio: 3.4 Ratio
Triglycerides: 76 mg/dL (ref <150)
VLDL: 15 mg/dL (ref 0–40)

## 2013-12-24 LAB — VITAMIN D 25 HYDROXY (VIT D DEFICIENCY, FRACTURES): VIT D 25 HYDROXY: 67 ng/mL (ref 30–89)

## 2013-12-24 LAB — TSH: TSH: 3.927 u[IU]/mL (ref 0.350–4.500)

## 2014-02-16 ENCOUNTER — Ambulatory Visit: Payer: 59 | Admitting: Internal Medicine

## 2014-03-01 ENCOUNTER — Encounter: Payer: Self-pay | Admitting: Internal Medicine

## 2014-08-26 ENCOUNTER — Emergency Department (HOSPITAL_COMMUNITY): Payer: 59

## 2014-08-26 ENCOUNTER — Emergency Department (HOSPITAL_COMMUNITY)
Admission: EM | Admit: 2014-08-26 | Discharge: 2014-08-27 | Disposition: A | Discharge status: Home / Self Care | Payer: 59 | Attending: Emergency Medicine | Admitting: Emergency Medicine

## 2014-08-26 ENCOUNTER — Encounter (HOSPITAL_COMMUNITY): Payer: Self-pay | Admitting: Emergency Medicine

## 2014-08-26 DIAGNOSIS — Z8709 Personal history of other diseases of the respiratory system: Secondary | ICD-10-CM | POA: Diagnosis not present

## 2014-08-26 DIAGNOSIS — Y999 Unspecified external cause status: Secondary | ICD-10-CM | POA: Insufficient documentation

## 2014-08-26 DIAGNOSIS — W19XXXA Unspecified fall, initial encounter: Secondary | ICD-10-CM

## 2014-08-26 DIAGNOSIS — S6992XA Unspecified injury of left wrist, hand and finger(s), initial encounter: Secondary | ICD-10-CM | POA: Diagnosis present

## 2014-08-26 DIAGNOSIS — Z780 Asymptomatic menopausal state: Secondary | ICD-10-CM | POA: Diagnosis not present

## 2014-08-26 DIAGNOSIS — Y939 Activity, unspecified: Secondary | ICD-10-CM | POA: Diagnosis not present

## 2014-08-26 DIAGNOSIS — Z7951 Long term (current) use of inhaled steroids: Secondary | ICD-10-CM | POA: Diagnosis not present

## 2014-08-26 DIAGNOSIS — Z79899 Other long term (current) drug therapy: Secondary | ICD-10-CM | POA: Insufficient documentation

## 2014-08-26 DIAGNOSIS — Z8669 Personal history of other diseases of the nervous system and sense organs: Secondary | ICD-10-CM | POA: Insufficient documentation

## 2014-08-26 DIAGNOSIS — Y92002 Bathroom of unspecified non-institutional (private) residence single-family (private) house as the place of occurrence of the external cause: Secondary | ICD-10-CM | POA: Diagnosis not present

## 2014-08-26 DIAGNOSIS — W010XXA Fall on same level from slipping, tripping and stumbling without subsequent striking against object, initial encounter: Secondary | ICD-10-CM | POA: Insufficient documentation

## 2014-08-26 DIAGNOSIS — Z8742 Personal history of other diseases of the female genital tract: Secondary | ICD-10-CM | POA: Insufficient documentation

## 2014-08-26 DIAGNOSIS — S62102A Fracture of unspecified carpal bone, left wrist, initial encounter for closed fracture: Secondary | ICD-10-CM | POA: Diagnosis not present

## 2014-08-26 MED ORDER — MORPHINE SULFATE 4 MG/ML IJ SOLN
4.0000 mg | Freq: Once | INTRAMUSCULAR | Status: AC
  Administered 2014-08-26: 4 mg via INTRAVENOUS
  Filled 2014-08-26: qty 1

## 2014-08-26 NOTE — ED Provider Notes (Signed)
CSN: [id number]     Arrival date & time 08/26/14  2205 History  This chart was scribed for non-physician practitioner working with Julianne Rice, MD by Molli Posey, ED Scribe. This patient was seen in room TR08C/TR08C and the patient's care was started at 11:30 PM.   Chief Complaint  Patient presents with  . Wrist Injury   Patient is a 54 y.o. female presenting with wrist injury. The history is provided by the patient. No language interpreter was used.  Wrist Injury Location:  Wrist Time since incident:  2 hours Injury: yes   Mechanism of injury: fall   Fall:    Fall occurred:  Tripped   Height of fall:  Standing   Impact surface:  Hard floor   Point of impact:  Outstretched arms Wrist location:  L wrist Pain details:    Quality: Stabbing.   Radiates to:  L forearm   Severity:  Severe   Onset quality:  Sudden   Duration:  2 hours   Timing:  Constant   Progression:  Unchanged Chronicity:  New Foreign body present:  No foreign bodies Relieved by:  Elevation Worsened by:  Movement Ineffective treatments:  None tried Associated symptoms: decreased range of motion (due to pain), swelling and tingling   Associated symptoms: no back pain, no muscle weakness, no neck pain and no numbness    HPI Comments: Teresa Wells is a 54 y.o. female who presents to the Emergency Department complaining of a fall that occurred 2 hours ago. Pt states she fell at home in the bathroom and she caught herself with her left arm, bending it backwards underneath her. Pt complains of constant, stabbing left wrist pain at this time and says it radiates up her forearm. She says that elevating her wrist improves her pain. Pt reports that any movement aggravates her pain. Pt states that her fingers are tingling and reports associated left wrist swelling. She denies any skins breaks or bleeding. Pt reports no history of medical problems. She reports no history of smoking. Pt states that the last time she  ate a meal was 3.5 hours. She states that she is allergic to oxycodone. She denies neck pain, back pain, and numbness. No focal weakness, although decreased grip due to pain. No other injuries during the fall. Denies head inj or LOC. Denies CP, SOB, abd pain, n/v, elbow pain, or shoulder pain.  Past Medical History  Diagnosis Date  . Insomnia   . Acute meniscal tear of knee LEFT  . Sinus infection 6 months ago  . Abnormal Pap smear   . Breast tumor 2009    benign  . Menopause    Past Surgical History  Procedure Laterality Date  . Bilateral salpingoophorectomy  2009    LAPAROSCOPIC-- LEFT ADNEXAL MASS  . Hysteroscopy with resectoscope  2006    MYOMECTOMY  . Knee arthroscopy  05/04/2011    Procedure: ARTHROSCOPY KNEE;  Surgeon: Johnn Hai;  Location: [place];  Service: Orthopedics;  Laterality: Left;  . Meniscus debridement  05/04/2011    Procedure: [id number] OF MENISCUS;  Surgeon: Johnn Hai;  Location: [place];  Service: Orthopedics;  Laterality: Left;  . Breast fibroadenoma surgery  2001    LEFT  . Incisional hernia repair N/A 11/12/2013    Procedure: HERNIA REPAIR INCISIONAL;  Surgeon: Odis Hollingshead, MD;  Location: [facility];  Service: General;  Laterality: N/A;  . Insertion of mesh N/A 11/12/2013  Procedure: INSERTION OF MESH;  Surgeon: Odis Hollingshead, MD;  Location: [facility];  Service: General;  Laterality: N/A;  . Mole removal Left 11/12/2013    Procedure: MOLE REMOVAL LEFT THIGH;  Surgeon: Odis Hollingshead, MD;  Location: [facility];  Service: General;  Laterality: Left;   Family History  Problem Relation Age of Onset  . Diabetes Mother   . Hypertension Mother   . Heart disease Mother   . Diabetes Father   . Hypertension Father   . Stroke Father   . Heart disease Father   . Heart disease Brother   . Hypertension Brother    History  Substance Use Topics  . Smoking status: Never Smoker   . Smokeless tobacco: Never Used  .  Alcohol Use: Yes     Comment: rare beer   OB History    Gravida Para Term Preterm AB TAB SAB Ectopic Multiple Living   1    1 1          Review of Systems  HENT: Negative for facial swelling (no head injury).   Respiratory: Negative for shortness of breath.   Cardiovascular: Negative for chest pain.  Gastrointestinal: Negative for nausea, vomiting and abdominal pain.  Genitourinary: Negative for difficulty urinating (no incontinence).  Musculoskeletal: Positive for joint swelling and arthralgias. Negative for back pain and neck pain.  Skin: Positive for color change (bruising). Negative for wound.  Allergic/Immunologic: Negative for immunocompromised state.  Neurological: Positive for weakness (decreased grip due to pain). Negative for numbness.       +tingling to digits of L hand  Hematological: Does not bruise/bleed easily.  Psychiatric/Behavioral: Negative for confusion.   10 Systems reviewed and all are negative for acute change except as noted in the HPI.  Allergies  Oxycodone; Penicillins; and Sulfa antibiotics  Home Medications   Prior to Admission medications   Medication Sig Start Date End Date Taking? Authorizing Provider  acetaminophen (TYLENOL) 500 MG tablet Take 1,000 mg by mouth every 6 (six) hours as needed for mild pain or moderate pain.     Historical Provider, MD  cetirizine (ZYRTEC) 5 MG tablet Take 5 mg by mouth daily.     Historical Provider, MD  fluticasone (FLONASE) 50 MCG/ACT nasal spray Place 1 spray into both nostrils daily.    Historical Provider, MD  HYDROcodone-acetaminophen (NORCO) 5-325 MG per tablet Take 1-2 tablets by mouth every 4 (four) hours as needed for moderate pain. 11/13/13   Jackolyn Confer, MD  IODINE, KELP, PO Take by mouth. 1 tab every other day    Historical Provider, MD  Multiple Vitamin (MULTIVITAMIN) tablet Take 1 tablet by mouth daily.      Historical Provider, MD  Solen Provider, MD  PRESCRIPTION  MEDICATION every other day.    Historical Provider, MD   BP 151/70 mmHg  Pulse 73  Temp(Src) 97.8 F (36.6 C) (Oral)  Resp 16  SpO2 100% Physical Exam  Constitutional: She is oriented to person, place, and time. Vital signs are normal. She appears well-developed and well-nourished.  Non-toxic appearance. No distress.  Afebrile, nontoxic, NAD  HENT:  Head: Normocephalic and atraumatic.  Mouth/Throat: Oropharynx is clear and moist and mucous membranes are normal.  Eyes: Conjunctivae and EOM are normal. Right eye exhibits no discharge. Left eye exhibits no discharge.  Neck: Normal range of motion. Neck supple.  Cardiovascular: Normal rate, regular rhythm, normal heart sounds and intact distal pulses.  Exam reveals no gallop  and no friction rub.   No murmur heard. Pulmonary/Chest: Effort normal and breath sounds normal. No respiratory distress. She has no decreased breath sounds. She has no wheezes. She has no rhonchi. She has no rales.  Abdominal: Soft. Normal appearance and bowel sounds are normal. She exhibits no distension. There is no tenderness. There is no rigidity, no rebound and no guarding.  Musculoskeletal:       Left wrist: She exhibits decreased range of motion, tenderness, bony tenderness, swelling and deformity. She exhibits no laceration.  L wrist with diminished ROM due to pain, obvious deformity with radial and dorsal angulation, exquisitely TTP over distal radius and ulna, swelling and bruising. Sensation grossly intact although pt endorses that she has tingling in all digits. Strength with grip slightly diminished due to pain, unable to assess wrist strength due to pain. Distal pulses intact, cap refill brisk and present. Forearm and elbow nonTTP, wiggles all digits.  Neurological: She is alert and oriented to person, place, and time. No sensory deficit.  Strength and sensation as noted above  Skin: Skin is warm, dry and intact. Bruising noted. No rash noted.  L wrist  bruising as noted above, skin intact  Psychiatric: She has a normal mood and affect. Her behavior is normal.  Nursing note and vitals reviewed.   ED Course  Procedures   DIAGNOSTIC STUDIES: Oxygen Saturation is 100% on RA, normal by my interpretation.    COORDINATION OF CARE: 11:36 PM Discussed treatment plan with pt at bedside and pt agreed to plan.   Labs Review Labs Reviewed - No data to display  Imaging Review Dg Wrist Complete Left  08/26/2014   CLINICAL DATA:  Slipped on wet floor and injured left wrist. Radial left wrist pain. Initial encounter.  EXAM: LEFT WRIST - COMPLETE 3+ VIEW  COMPARISON:  None.  FINDINGS: There is a comminuted and mildly impacted fracture of the distal radial metaphysis, with dorsal angulation. There is extension to the radiocarpal joint. A mildly displaced ulnar styloid fracture is also seen. The carpal rows articulate with the distal radial fragments. Surrounding soft tissue swelling is noted.  No additional fractures are seen.  IMPRESSION: Comminuted and mildly impacted fracture of the distal radial metaphysis, with dorsal angulation. Extension to the radiocarpal joint. Mildly displaced ulnar styloid fracture also seen.   Electronically Signed   By: Garald Balding M.D.   On: 08/26/2014 23:02     EKG Interpretation None      MDM   Final diagnoses:  Left wrist fracture, closed, initial encounter  Fall, initial encounter    54 y.o. female here with L wrist injury after fall, neurovascularly intact with soft compartments, deformity noted. Some tingling reported, but sensation intact. Xray obtained in triage showing comminuted impacted distal radial fx with dorsal angulation. Will give pain control and get labs in case hand surgeon wants to fix this tonight. Will page Copy. Will reassess after surgeon returns call.  11:58 PM Dr. Burney Gauze returning page, would like pt to be NPO after midnight tonight, call his office in the AM at 6:38 ask for  alicia and schedule surgery tomorrow for this. Stated not to reduce it, place sugartong splint and sling, give pain meds. Will discontinue labs. Awaiting pain meds to be given, and ortho tech to come splint  12:44 AM  Splint placed, neurovascularly intact after placement. Pt still reporting pain, given that she is to be NPO at this time will proceed with more IV pain medication prior  to discharge. Will ensure pain control prior to d/c. Will send home with norco and discussed RICE therapy. Reiterated instructions of NPO and calling dr. Burney Gauze tomorrow. I explained the diagnosis and have given explicit precautions to return to the ER including for any other new or worsening symptoms. The patient understands and accepts the medical plan as it's been dictated and I have answered their questions. Discharge instructions concerning home care and prescriptions have been given. The patient is STABLE and is discharged to home in good condition.    I personally performed the services described in this documentation, which was scribed in my presence. The recorded information has been reviewed and is accurate.  BP 133/75 mmHg  Pulse 65  Temp(Src) 97.8 F (36.6 C) (Oral)  Resp 16  SpO2 98%  Meds ordered this encounter  Medications  . morphine 4 MG/ML injection 4 mg    Sig:   . morphine 4 MG/ML injection 4 mg    Sig:   . HYDROcodone-acetaminophen (NORCO) 5-325 MG per tablet    Sig: Take 1-2 tablets by mouth every 6 (six) hours as needed for severe pain.    Dispense:  30 tablet    Refill:  0     Yaneliz Radebaugh Camprubi-Soms, PA-C 08/27/14 0109  Julianne Rice, MD 08/27/14 [phone]

## 2014-08-26 NOTE — ED Notes (Signed)
Pt. slipped and fell at home in bathroom this evening and injured her left wrist , presents with pain and swelling .

## 2014-08-27 ENCOUNTER — Ambulatory Visit (HOSPITAL_BASED_OUTPATIENT_CLINIC_OR_DEPARTMENT_OTHER): Payer: 59 | Admitting: Anesthesiology

## 2014-08-27 ENCOUNTER — Ambulatory Visit (HOSPITAL_BASED_OUTPATIENT_CLINIC_OR_DEPARTMENT_OTHER)
Admission: AD | Admit: 2014-08-27 | Discharge: 2014-08-27 | Disposition: A | Discharge status: Home / Self Care | Payer: 59 | Source: Ambulatory Visit | Attending: Orthopedic Surgery | Admitting: Orthopedic Surgery

## 2014-08-27 ENCOUNTER — Encounter (HOSPITAL_BASED_OUTPATIENT_CLINIC_OR_DEPARTMENT_OTHER)
Admission: AD | Disposition: A | Discharge status: Home / Self Care | Payer: Self-pay | Source: Ambulatory Visit | Attending: Orthopedic Surgery

## 2014-08-27 ENCOUNTER — Other Ambulatory Visit: Payer: Self-pay | Admitting: Orthopedic Surgery

## 2014-08-27 ENCOUNTER — Encounter (HOSPITAL_BASED_OUTPATIENT_CLINIC_OR_DEPARTMENT_OTHER): Payer: Self-pay | Admitting: *Deleted

## 2014-08-27 DIAGNOSIS — S52572A Other intraarticular fracture of lower end of left radius, initial encounter for closed fracture: Secondary | ICD-10-CM | POA: Diagnosis not present

## 2014-08-27 DIAGNOSIS — W010XXA Fall on same level from slipping, tripping and stumbling without subsequent striking against object, initial encounter: Secondary | ICD-10-CM | POA: Insufficient documentation

## 2014-08-27 DIAGNOSIS — Z79899 Other long term (current) drug therapy: Secondary | ICD-10-CM | POA: Diagnosis not present

## 2014-08-27 DIAGNOSIS — Z885 Allergy status to narcotic agent status: Secondary | ICD-10-CM | POA: Diagnosis not present

## 2014-08-27 DIAGNOSIS — G5602 Carpal tunnel syndrome, left upper limb: Secondary | ICD-10-CM | POA: Diagnosis not present

## 2014-08-27 DIAGNOSIS — Y929 Unspecified place or not applicable: Secondary | ICD-10-CM | POA: Diagnosis not present

## 2014-08-27 DIAGNOSIS — Z882 Allergy status to sulfonamides status: Secondary | ICD-10-CM | POA: Insufficient documentation

## 2014-08-27 DIAGNOSIS — Z880 Allergy status to penicillin: Secondary | ICD-10-CM | POA: Insufficient documentation

## 2014-08-27 HISTORY — PX: OPEN REDUCTION INTERNAL FIXATION (ORIF) DISTAL RADIAL FRACTURE: SHX5989

## 2014-08-27 HISTORY — PX: CARPAL TUNNEL RELEASE: SHX101

## 2014-08-27 LAB — POCT HEMOGLOBIN-HEMACUE: Hemoglobin: 14.8 g/dL (ref 12.0–15.0)

## 2014-08-27 SURGERY — OPEN REDUCTION INTERNAL FIXATION (ORIF) DISTAL RADIUS FRACTURE
Anesthesia: General | Site: Wrist | Laterality: Left

## 2014-08-27 MED ORDER — PROMETHAZINE HCL 25 MG/ML IJ SOLN: 6.2500 mg | INTRAMUSCULAR | Status: DC | PRN

## 2014-08-27 MED ORDER — FENTANYL CITRATE (PF) 100 MCG/2ML IJ SOLN
50.0000 ug | INTRAMUSCULAR | Status: DC | PRN
  Administered 2014-08-27: 100 ug via INTRAVENOUS

## 2014-08-27 MED ORDER — MIDAZOLAM HCL 2 MG/2ML IJ SOLN
INTRAMUSCULAR | Status: AC
  Filled 2014-08-27: qty 2

## 2014-08-27 MED ORDER — CEFAZOLIN SODIUM-DEXTROSE 2-3 GM-% IV SOLR
INTRAVENOUS | Status: AC
Start: 2014-08-27 — End: 2014-08-27
  Filled 2014-08-27: qty 50

## 2014-08-27 MED ORDER — FENTANYL CITRATE (PF) 100 MCG/2ML IJ SOLN
INTRAMUSCULAR | Status: AC
  Filled 2014-08-27: qty 6

## 2014-08-27 MED ORDER — BUPIVACAINE-EPINEPHRINE (PF) 0.5% -1:200000 IJ SOLN
INTRAMUSCULAR | Status: DC | PRN
  Administered 2014-08-27: 30 mL via PERINEURAL

## 2014-08-27 MED ORDER — MIDAZOLAM HCL 2 MG/2ML IJ SOLN
1.0000 mg | INTRAMUSCULAR | Status: DC | PRN
  Administered 2014-08-27 (×2): 2 mg via INTRAVENOUS

## 2014-08-27 MED ORDER — DEXAMETHASONE SODIUM PHOSPHATE 4 MG/ML IJ SOLN
INTRAMUSCULAR | Status: DC | PRN
  Administered 2014-08-27: 10 mg via INTRAVENOUS

## 2014-08-27 MED ORDER — VANCOMYCIN HCL IN DEXTROSE 1-5 GM/200ML-% IV SOLN
1000.0000 mg | INTRAVENOUS | Status: AC
  Administered 2014-08-27: 1000 mg via INTRAVENOUS

## 2014-08-27 MED ORDER — FENTANYL CITRATE (PF) 100 MCG/2ML IJ SOLN
INTRAMUSCULAR | Status: AC
  Filled 2014-08-27: qty 2

## 2014-08-27 MED ORDER — LACTATED RINGERS IV SOLN
INTRAVENOUS | Status: DC
  Administered 2014-08-27: 12:00:00 via INTRAVENOUS

## 2014-08-27 MED ORDER — HYDROMORPHONE HCL 1 MG/ML IJ SOLN: 0.2500 mg | INTRAMUSCULAR | Status: DC | PRN

## 2014-08-27 MED ORDER — ONDANSETRON HCL 4 MG/2ML IJ SOLN
INTRAMUSCULAR | Status: DC | PRN
  Administered 2014-08-27 (×2): 4 mg via INTRAVENOUS

## 2014-08-27 MED ORDER — VANCOMYCIN HCL IN DEXTROSE 1-5 GM/200ML-% IV SOLN
INTRAVENOUS | Status: AC
  Filled 2014-08-27: qty 200

## 2014-08-27 MED ORDER — HYDROCODONE-ACETAMINOPHEN 5-325 MG PO TABS: 1.0000 | ORAL_TABLET | Freq: Four times a day (QID) | ORAL | Status: DC | PRN

## 2014-08-27 MED ORDER — MORPHINE SULFATE 4 MG/ML IJ SOLN
4.0000 mg | Freq: Once | INTRAMUSCULAR | Status: AC
  Administered 2014-08-27: 4 mg via INTRAVENOUS
  Filled 2014-08-27: qty 1

## 2014-08-27 MED ORDER — FENTANYL CITRATE (PF) 100 MCG/2ML IJ SOLN
INTRAMUSCULAR | Status: DC | PRN
  Administered 2014-08-27: 100 ug via INTRAVENOUS

## 2014-08-27 MED ORDER — LIDOCAINE HCL (CARDIAC) 20 MG/ML IV SOLN
INTRAVENOUS | Status: DC | PRN
  Administered 2014-08-27: 50 mg via INTRAVENOUS

## 2014-08-27 MED ORDER — CHLORHEXIDINE GLUCONATE 4 % EX LIQD: 60.0000 mL | Freq: Once | CUTANEOUS | Status: DC

## 2014-08-27 SURGICAL SUPPLY — 73 items
APL SKNCLS STERI-STRIP NONHPOA (GAUZE/BANDAGES/DRESSINGS) ×1
BAG DECANTER FOR FLEXI CONT (MISCELLANEOUS) IMPLANT
BANDAGE ELASTIC 3 VELCRO ST LF (GAUZE/BANDAGES/DRESSINGS) IMPLANT
BANDAGE ELASTIC 4 VELCRO ST LF (GAUZE/BANDAGES/DRESSINGS) ×3 IMPLANT
BENZOIN TINCTURE PRP APPL 2/3 (GAUZE/BANDAGES/DRESSINGS) ×3 IMPLANT
BIT DRILL 2 FAST STEP (BIT) ×3 IMPLANT
BIT DRILL 2.5X4 QC (BIT) ×3 IMPLANT
BLADE MINI RND TIP GREEN BEAV (BLADE) IMPLANT
BLADE SURG 15 STRL LF DISP TIS (BLADE) ×2 IMPLANT
BLADE SURG 15 STRL SS (BLADE) ×6
BNDG CMPR 9X4 STRL LF SNTH (GAUZE/BANDAGES/DRESSINGS) ×1
BNDG ESMARK 4X9 LF (GAUZE/BANDAGES/DRESSINGS) ×3 IMPLANT
BNDG GAUZE ELAST 4 BULKY (GAUZE/BANDAGES/DRESSINGS) ×3 IMPLANT
CANISTER SUCT 1200ML W/VALVE (MISCELLANEOUS) IMPLANT
CLOSURE WOUND 1/2 X4 (GAUZE/BANDAGES/DRESSINGS) ×1
CORDS BIPOLAR (ELECTRODE) ×3 IMPLANT
COVER BACK TABLE 60X90IN (DRAPES) ×3 IMPLANT
CUFF TOURNIQUET SINGLE 18IN (TOURNIQUET CUFF) IMPLANT
DECANTER SPIKE VIAL GLASS SM (MISCELLANEOUS) IMPLANT
DRAPE EXTREMITY T 121X128X90 (DRAPE) ×3 IMPLANT
DRAPE OEC MINIVIEW 54X84 (DRAPES) ×3 IMPLANT
DRAPE SURG 17X23 STRL (DRAPES) ×3 IMPLANT
DURAPREP 26ML APPLICATOR (WOUND CARE) ×3 IMPLANT
ELECT REM PT RETURN 9FT ADLT (ELECTROSURGICAL)
ELECTRODE REM PT RTRN 9FT ADLT (ELECTROSURGICAL) IMPLANT
GAUZE SPONGE 4X4 12PLY STRL (GAUZE/BANDAGES/DRESSINGS) ×3 IMPLANT
GAUZE SPONGE 4X4 16PLY XRAY LF (GAUZE/BANDAGES/DRESSINGS) IMPLANT
GAUZE XEROFORM 1X8 LF (GAUZE/BANDAGES/DRESSINGS) IMPLANT
GLOVE SURG SYN 8.0 (GLOVE) ×6 IMPLANT
GOWN STRL REUS W/ TWL LRG LVL3 (GOWN DISPOSABLE) ×1 IMPLANT
GOWN STRL REUS W/TWL LRG LVL3 (GOWN DISPOSABLE) ×3
GOWN STRL REUS W/TWL XL LVL3 (GOWN DISPOSABLE) ×9 IMPLANT
KIT ASCP FXDISP 3X8XBTNDS (KITS) IMPLANT
KIT BIO-TENODESIS 3X8 DISP (KITS)
NEEDLE HYPO 25X1 1.5 SAFETY (NEEDLE) ×3 IMPLANT
NS IRRIG 1000ML POUR BTL (IV SOLUTION) ×3 IMPLANT
PACK BASIN DAY SURGERY FS (CUSTOM PROCEDURE TRAY) ×3 IMPLANT
PAD CAST 3X4 CTTN HI CHSV (CAST SUPPLIES) ×1 IMPLANT
PAD CAST 4YDX4 CTTN HI CHSV (CAST SUPPLIES) IMPLANT
PADDING CAST ABS 4INX4YD NS (CAST SUPPLIES) ×2
PADDING CAST ABS COTTON 4X4 ST (CAST SUPPLIES) ×1 IMPLANT
PADDING CAST COTTON 3X4 STRL (CAST SUPPLIES) ×3
PADDING CAST COTTON 4X4 STRL (CAST SUPPLIES)
PEG SUBCHONDRAL SMOOTH 2.0X20 (Peg) ×21 IMPLANT
PEG SUBCHONDRAL SMOOTH 2.0X22 (Peg) ×3 IMPLANT
PENCIL BUTTON HOLSTER BLD 10FT (ELECTRODE) IMPLANT
PLATE SHORT 21.6X48.9 NRRW LT (Plate) ×3 IMPLANT
SCREW BN 12X3.5XNS CORT TI (Screw) ×2 IMPLANT
SCREW CORT 3.5X10 LNG (Screw) ×3 IMPLANT
SCREW CORT 3.5X12 (Screw) ×6 IMPLANT
SHEET MEDIUM DRAPE 40X70 STRL (DRAPES) ×3 IMPLANT
SPLINT PLASTER CAST XFAST 3X15 (CAST SUPPLIES) IMPLANT
SPLINT PLASTER CAST XFAST 4X15 (CAST SUPPLIES) ×5 IMPLANT
SPLINT PLASTER XTRA FAST SET 4 (CAST SUPPLIES) ×10
SPLINT PLASTER XTRA FASTSET 3X (CAST SUPPLIES)
STOCKINETTE 4X48 STRL (DRAPES) ×3 IMPLANT
STRIP CLOSURE SKIN 1/2X4 (GAUZE/BANDAGES/DRESSINGS) ×2 IMPLANT
SUCTION FRAZIER TIP 10 FR DISP (SUCTIONS) IMPLANT
SUT ETHILON 4 0 PS 2 18 (SUTURE) IMPLANT
SUT MERSILENE 4 0 P 3 (SUTURE) IMPLANT
SUT PROLENE 3 0 PS 2 (SUTURE) ×3 IMPLANT
SUT SILK 2 0 FS (SUTURE) IMPLANT
SUT VIC AB 0 SH 27 (SUTURE) ×3 IMPLANT
SUT VIC AB 3-0 FS2 27 (SUTURE) IMPLANT
SUT VIC AB 4-0 RB1 18 (SUTURE) ×3 IMPLANT
SUT VICRYL RAPIDE 4-0 (SUTURE) IMPLANT
SUT VICRYL RAPIDE 4/0 PS 2 (SUTURE) IMPLANT
SYR BULB 3OZ (MISCELLANEOUS) ×3 IMPLANT
SYRINGE 10CC LL (SYRINGE) ×3 IMPLANT
TOWEL OR 17X24 6PK STRL BLUE (TOWEL DISPOSABLE) ×3 IMPLANT
TUBE CONNECTING 20'X1/4 (TUBING)
TUBE CONNECTING 20X1/4 (TUBING) IMPLANT
UNDERPAD 30X30 (UNDERPADS AND DIAPERS) ×3 IMPLANT

## 2014-08-27 NOTE — Anesthesia Procedure Notes (Addendum)
Anesthesia Regional Block:  Supraclavicular block  Pre-Anesthetic Checklist: ,, timeout performed, Correct Patient, Correct Site, Correct Laterality, Correct Procedure, Correct Position, site marked, Risks and benefits discussed,  Surgical consent,  Pre-op evaluation,  At surgeon's request and post-op pain management  Laterality: Left and Upper  Prep: chloraprep       Needles:   Needle Type: Echogenic Needle     Needle Length: 9cm 9 cm Needle Gauge: 21 and 21 G  Needle insertion depth: 5 cm   Additional Needles:  Procedures: ultrasound guided (picture in chart) and nerve stimulator Supraclavicular block Narrative:  Start time: 08/27/2014 12:15 PM End time: 08/27/2014 12:30 PM Injection made incrementally with aspirations every 5 mL.  Performed by: Personally  Anesthesiologist: MASSAGEE, TERRY  Additional Notes: Tolerated well   Procedure Name: LMA Insertion Date/Time: 08/27/2014 1:10 PM Performed by: Melynda Ripple D Pre-anesthesia Checklist: Patient identified, Emergency Drugs available, Suction available and Patient being monitored Patient Re-evaluated:Patient Re-evaluated prior to inductionOxygen Delivery Method: Circle System Utilized Preoxygenation: Pre-oxygenation with 100% oxygen Intubation Type: IV induction Ventilation: Mask ventilation without difficulty LMA: LMA inserted LMA Size: 4.0 Number of attempts: 1 Airway Equipment and Method: Bite block Placement Confirmation: positive ETCO2 Tube secured with: Tape Dental Injury: Teeth and Oropharynx as per pre-operative assessment

## 2014-08-27 NOTE — H&P (Signed)
Teresa Wells is an 54 y.o. female.   Chief Complaint: left wrist pain and swelling with numbness HPI: as above s/p fall onto left arm with displaced distal radius fracture  Past Medical History  Diagnosis Date  . Insomnia   . Acute meniscal tear of knee LEFT  . Sinus infection 6 months ago  . Abnormal Pap smear   . Breast tumor 2009    benign  . Menopause     Past Surgical History  Procedure Laterality Date  . Bilateral salpingoophorectomy  2009    LAPAROSCOPIC-- LEFT ADNEXAL MASS  . Hysteroscopy with resectoscope  2006    MYOMECTOMY  . Knee arthroscopy  05/04/2011    Procedure: ARTHROSCOPY KNEE;  Surgeon: Johnn Hai;  Location: [place];  Service: Orthopedics;  Laterality: Left;  . Meniscus debridement  05/04/2011    Procedure: [id number] OF MENISCUS;  Surgeon: Johnn Hai;  Location: [place];  Service: Orthopedics;  Laterality: Left;  . Breast fibroadenoma surgery  2001    LEFT  . Incisional hernia repair N/A 11/12/2013    Procedure: HERNIA REPAIR INCISIONAL;  Surgeon: Odis Hollingshead, MD;  Location: [facility];  Service: General;  Laterality: N/A;  . Insertion of mesh N/A 11/12/2013    Procedure: INSERTION OF MESH;  Surgeon: Odis Hollingshead, MD;  Location: [facility];  Service: General;  Laterality: N/A;  . Mole removal Left 11/12/2013    Procedure: MOLE REMOVAL LEFT THIGH;  Surgeon: Odis Hollingshead, MD;  Location: [facility];  Service: General;  Laterality: Left;    Family History  Problem Relation Age of Onset  . Diabetes Mother   . Hypertension Mother   . Heart disease Mother   . Diabetes Father   . Hypertension Father   . Stroke Father   . Heart disease Father   . Heart disease Brother   . Hypertension Brother    Social History:  reports that she has never smoked. She has never used smokeless tobacco. She reports that she drinks alcohol. She reports that she does not use illicit drugs.  Allergies:  Allergies  Allergen  Reactions  . Oxycodone   . Penicillins Hives  . Sulfa Antibiotics Hives    Facility-administered medications prior to admission  Medication Dose Route Frequency Provider Last Rate Last Dose  . TDaP (BOOSTRIX) injection 0.5 mL  0.5 mL Intramuscular Once Lanice Shirts, MD       Medications Prior to Admission  Medication Sig Dispense Refill  . acetaminophen (TYLENOL) 500 MG tablet Take 1,000 mg by mouth every 6 (six) hours as needed for mild pain or moderate pain.     . cetirizine (ZYRTEC) 5 MG tablet Take 5 mg by mouth daily.     . fluticasone (FLONASE) 50 MCG/ACT nasal spray Place 1 spray into both nostrils daily.    Marland Kitchen HYDROcodone-acetaminophen (NORCO) 5-325 MG per tablet Take 1-2 tablets by mouth every 4 (four) hours as needed for moderate pain. 40 tablet 0  . HYDROcodone-acetaminophen (NORCO) 5-325 MG per tablet Take 1-2 tablets by mouth every 6 (six) hours as needed for severe pain. 30 tablet 0  . ibuprofen (ADVIL,MOTRIN) 200 MG tablet Take 200 mg by mouth every 6 (six) hours as needed.    . IODINE, KELP, PO Take by mouth. 1 tab every other day    . Multiple Vitamin (MULTIVITAMIN) tablet Take 1 tablet by mouth daily.      Marland Kitchen PRESCRIPTION MEDICATION     .  PRESCRIPTION MEDICATION every other day.      Results for orders placed or performed during the hospital encounter of 08/27/14 (from the past 48 hour(s))  Hemoglobin-hemacue, POC     Status: None   Collection Time: 08/27/14 12:18 PM  Result Value Ref Range   Hemoglobin 14.8 12.0 - 15.0 g/dL   Dg Wrist Complete Left  08/26/2014   CLINICAL DATA:  Slipped on wet floor and injured left wrist. Radial left wrist pain. Initial encounter.  EXAM: LEFT WRIST - COMPLETE 3+ VIEW  COMPARISON:  None.  FINDINGS: There is a comminuted and mildly impacted fracture of the distal radial metaphysis, with dorsal angulation. There is extension to the radiocarpal joint. A mildly displaced ulnar styloid fracture is also seen. The carpal rows  articulate with the distal radial fragments. Surrounding soft tissue swelling is noted.  No additional fractures are seen.  IMPRESSION: Comminuted and mildly impacted fracture of the distal radial metaphysis, with dorsal angulation. Extension to the radiocarpal joint. Mildly displaced ulnar styloid fracture also seen.   Electronically Signed   By: Garald Balding M.D.   On: 08/26/2014 23:02    Review of Systems  All other systems reviewed and are negative.   Blood pressure 151/69, pulse 79, temperature 98.3 F (36.8 C), temperature source Oral, resp. rate 22, height 5\' 3"  (1.6 m), weight 78.699 kg (173 lb 8 oz), SpO2 97 %. Physical Exam  Constitutional: She is oriented to person, place, and time. She appears well-developed and well-nourished.  HENT:  Head: Normocephalic and atraumatic.  Cardiovascular: Normal rate.   Respiratory: Effort normal.  Musculoskeletal:       Left wrist: She exhibits tenderness, bony tenderness, swelling and deformity.  Displaced intraarticular left distal radius fracture  Neurological: She is alert and oriented to person, place, and time.  Skin: Skin is warm.  Psychiatric: She has a normal mood and affect. Her behavior is normal. Judgment and thought content normal.     Assessment/Plan As above   Plan ORIF with CTR  Darcus Edds A 08/27/2014, 12:51 PM

## 2014-08-27 NOTE — Anesthesia Preprocedure Evaluation (Signed)
Anesthesia Evaluation  Patient identified by MRN, date of birth, ID band Patient awake    Reviewed: Allergy & Precautions, H&P , NPO status , Patient's Chart, lab work & pertinent test results  History of Anesthesia Complications Negative for: history of anesthetic complications  Airway Mallampati: I       Dental no notable dental hx. (+) Teeth Intact   Pulmonary neg pulmonary ROS,  breath sounds clear to auscultation  Pulmonary exam normal       Cardiovascular negative cardio ROS  IRhythm:regular Rate:Normal     Neuro/Psych negative neurological ROS  negative psych ROS   GI/Hepatic negative GI ROS, Neg liver ROS,   Endo/Other  negative endocrine ROS  Renal/GU negative Renal ROS  negative genitourinary   Musculoskeletal   Abdominal   Peds  Hematology negative hematology ROS (+)   Anesthesia Other Findings   Reproductive/Obstetrics negative OB ROS                             Anesthesia Physical Anesthesia Plan  ASA: I  Anesthesia Plan: General   Post-op Pain Management:    Induction: Intravenous  Airway Management Planned: LMA  Additional Equipment:   Intra-op Plan:   Post-operative Plan: Extubation in OR  Informed Consent: I have reviewed the patients History and Physical, chart, labs and discussed the procedure including the risks, benefits and alternatives for the proposed anesthesia with the patient or authorized representative who has indicated his/her understanding and acceptance.     Plan Discussed with: CRNA and Surgeon  Anesthesia Plan Comments:         Anesthesia Quick Evaluation

## 2014-08-27 NOTE — Op Note (Signed)
See note [id number]

## 2014-08-27 NOTE — Progress Notes (Signed)
Orthopedic Tech Progress Note Patient Details:  Teresa Wells July 21, 1960 [id number]  Ortho Devices Type of Ortho Device: Arm sling, Sugartong splint Ortho Device/Splint Interventions: Application   Katheren Shams 08/27/2014, 12:09 AM

## 2014-08-27 NOTE — Transfer of Care (Signed)
Immediate Anesthesia Transfer of Care Note  Patient: Teresa Wells  Procedure(s) Performed: Procedure(s): OPEN REDUCTION INTERNAL FIXATION (ORIF) LEFT DISTAL RADIAL FRACTURE (Left) LEFT CARPAL TUNNEL RELEASE (Left)  Patient Location: PACU  Anesthesia Type:General  Level of Consciousness: awake, alert  and oriented  Airway & Oxygen Therapy: Patient Spontanous Breathing and Patient connected to face mask oxygen  Post-op Assessment: Report given to RN and Post -op Vital signs reviewed and stable  Post vital signs: Reviewed and stable  Last Vitals:  Filed Vitals:   08/27/14 1237  BP:   Pulse: 79  Temp:   Resp: 22    Complications: No apparent anesthesia complications

## 2014-08-27 NOTE — Discharge Instructions (Signed)
Call Dr. Bertis Ruddy office tomorrow morning at 8:30 AM and ask for Teresa Wells, they will schedule your surgery. DO NOT EAT OR DRINK ANYTHING AFTER MIDNIGHT TONIGHT. Use motrin or norco as directed as needed for pain but don't drive or operate machinery while taking this medication. Rest your arm, elevate it, and ice it for 20 minutes every hour to help with pain relief. Return to the ER for changes or worsening symptoms.   Wrist Fracture A wrist fracture is a break or crack in one of the bones of your wrist. Your wrist is made up of eight small bones at the palm of your hand (carpal bones) and two long bones that make up your forearm (radius and ulna). The goal of treatment is to hold the injured bone in place while it heals. Surgery may or may not be needed to care for your injured wrist.  HOME CARE  Keep your injured wrist raised (elevated). Move your fingers as much as you can.  Do not put pressure on any part of your cast or splint. It may break.  Use a plastic bag to protect your cast or splint from water while bathing or showering. Do not lower your cast or splint into water.  Take medicines only as told by your doctor.  Keep your cast or splint clean and dry. If it gets wet, damaged, or suddenly feels too tight, tell your doctor right away.  Do not use any tobacco products including cigarettes, chewing tobacco, or electronic cigarettes. Tobacco can slow bone healing. If you need help quitting, ask your doctor.  Keep all follow-up visits as told by your doctor. This is important.  Ask your doctor if you should take supplements of calcium and vitamins C and D. GET HELP IF:   Your cast or splint is damaged, breaks, or gets wet.  You have a fever.  You have chills.  You have very bad pain that does not go away.  You have more swelling (inflammation) than before the cast was put on. GET HELP RIGHT AWAY IF:   Your hand or fingernails on the injured arm turn blue or gray, or feel cold  or numb.  You lose some feeling in the fingers of your injured arm. MAKE SURE YOU:   Understand these instructions.  Will watch your condition.  Will get help right away if you are not doing well or get worse. Document Released: 10/03/2007 Document Revised: 08/31/2013 Document Reviewed: 10/29/2011 [facility] Patient Information 2015 [place], [place]. This information is not intended to replace advice given to you by your health care provider. Make sure you discuss any questions you have with your health care provider.

## 2014-08-27 NOTE — Anesthesia Postprocedure Evaluation (Signed)
  Anesthesia Post-op Note  Patient: Teresa Wells  Procedure(s) Performed: Procedure(s): OPEN REDUCTION INTERNAL FIXATION (ORIF) LEFT DISTAL RADIAL FRACTURE (Left) LEFT CARPAL TUNNEL RELEASE (Left)  Patient Location: PACU  Anesthesia Type:GA combined with regional for post-op pain  Level of Consciousness: awake and alert   Airway and Oxygen Therapy: Patient Spontanous Breathing  Post-op Pain: none  Post-op Assessment: Post-op Vital signs reviewed  Post-op Vital Signs: stable  Last Vitals:  Filed Vitals:   08/27/14 1528  BP: 149/69  Pulse: 74  Temp: 36.8 C  Resp: 18    Complications: No apparent anesthesia complications

## 2014-08-27 NOTE — ED Notes (Signed)
Ortho paged. 

## 2014-08-27 NOTE — Discharge Instructions (Signed)
°  Post Anesthesia Home Care Instructions ° °Activity: °Get plenty of rest for the remainder of the day. A responsible adult should stay with you for 24 hours following the procedure.  °For the next 24 hours, DO NOT: °-Drive a car °-Operate machinery °-Drink alcoholic beverages °-Take any medication unless instructed by your physician °-Make any legal decisions or sign important papers. ° °Meals: °Start with liquid foods such as gelatin or soup. Progress to regular foods as tolerated. Avoid greasy, spicy, heavy foods. If nausea and/or vomiting occur, drink only clear liquids until the nausea and/or vomiting subsides. Call your physician if vomiting continues. ° °Special Instructions/Symptoms: °Your throat may feel dry or sore from the anesthesia or the breathing tube placed in your throat during surgery. If this causes discomfort, gargle with warm salt water. The discomfort should disappear within 24 hours. ° °If you had a scopolamine patch placed behind your ear for the management of post- operative nausea and/or vomiting: ° °1. The medication in the patch is effective for 72 hours, after which it should be removed.  Wrap patch in a tissue and discard in the trash. Wash hands thoroughly with soap and water. °2. You may remove the patch earlier than 72 hours if you experience unpleasant side effects which may include dry mouth, dizziness or visual disturbances. °3. Avoid touching the patch. Wash your hands with soap and water after contact with the patch. °  °Regional Anesthesia Blocks ° °1. Numbness or the inability to move the "blocked" extremity may last from 3-48 hours after placement. The length of time depends on the medication injected and your individual response to the medication. If the numbness is not going away after 48 hours, call your surgeon. ° °2. The extremity that is blocked will need to be protected until the numbness is gone and the  Strength has returned. Because you cannot feel it, you will need  to take extra care to avoid injury. Because it may be weak, you may have difficulty moving it or using it. You may not know what position it is in without looking at it while the block is in effect. ° °3. For blocks in the legs and feet, returning to weight bearing and walking needs to be done carefully. You will need to wait until the numbness is entirely gone and the strength has returned. You should be able to move your leg and foot normally before you try and bear weight or walk. You will need someone to be with you when you first try to ensure you do not fall and possibly risk injury. ° °4. Bruising and tenderness at the needle site are common side effects and will resolve in a few days. ° °5. Persistent numbness or new problems with movement should be communicated to the surgeon or the [facility] ([phone])/ [facility] ([phone]). °

## 2014-08-27 NOTE — Progress Notes (Signed)
Assisted Dr. Massagee with left, ultrasound guided, supraclavicular block. Side rails up, monitors on throughout procedure. See vital signs in flow sheet. Tolerated Procedure well. 

## 2014-08-27 NOTE — Progress Notes (Signed)
Spoke with Teresa Wells at Dr Bertis Ruddy office to request orders

## 2014-08-28 NOTE — Op Note (Signed)
Teresa Wells, PARISI NO.:  [id number]  MEDICAL RECORD NO.:  [id number]  LOCATION:  TR08C                        FACILITY:  [facility]  PHYSICIAN:  Sheral Apley. Tomas Schamp, M.D.DATE OF BIRTH:  1960/07/12  DATE OF PROCEDURE:  08/27/2014 DATE OF DISCHARGE:  08/27/2014                              OPERATIVE REPORT   PREOPERATIVE DIAGNOSIS:  Displaced intra-articular fracture, 4 part left distal radius with left carpal tunnel syndrome.  POSTOPERATIVE DIAGNOSIS:  Displaced intra-articular fracture, 4 part left distal radius with left carpal tunnel syndrome.  PROCEDURE:  Open reduction and internal fixation above with DVR plate and screws, standard short plate, and carpal tunnel release.  SURGEON:  Sheral Apley. Burney Gauze, M.D.  ASSISTANT:  Julian Reil, P.A.  ANESTHESIA:  Adequate block and general.  COMPLICATIONS:  No complication.  DRAINS:  No drains.  DESCRIPTION OF PROCEDURE:  The patient was taken to operating suite. After induction of general anesthetic and axillary block anesthesia, left upper extremity prepped and draped in sterile fashion.  An Esmarch was used to exsanguinate the limb.  Tourniquet was inflated to 250 mmHg. At this point in time, an incision was made over the palpable border of flexor carpi radialis tendon and the left wrist skin incised 5-6 cm, and sheath overlying the FCR was incised and FCR was tracked to the midline, radial artery to the lateral side.  This was developed down to the level of pronator quadratus.  We did a subperiosteal dissection of the pronator quadratus off the distal radius revealing 3-4 part intra- articular fracture, released brachioradialis off the distal fragment to aid in reduction.  Flexion, ulnar deviation,  and traction was used to reduce the fracture.  A standard DVR short plate was placed on the lower aspect.  We used direct fluoroscopic guidance and imaging and visualization to reveal adequate plate  positioning, and we used single screw in the slotted hole.  We then positioned the plate and it locked down 2 more proximal screws followed by smooth pegs distally. Intraoperative fluoroscopy revealed adequate reduction in AP, lateral, and oblique view.  We identified the median nerve in the proximal aspect of the wound traced it to the proximal edge of the transverse carpal ligament.  We then protected with a Soil scientist and created a path dorsally and volarly and released the transverse carpal ligament from proximal to distal.  The nerve was completely decompressed.  The wound was thoroughly irrigated. We then closed in layers of 2-0 undyed Vicryl to cover the plate with the pronator quadratus, 4-0 Vicryl subcutaneously, and 3-0 Prolene subcuticular stitch on the skin.  Steri-Strips, 4x4s, fluffs, and a volar splint was applied.  The patient tolerated the procedure well, went to the recovery room in stable fashion.     Sheral Apley Burney Gauze, M.D.     MAW/MEDQ  D:  08/27/2014  T:  08/28/2014  Job:  [id number]

## 2014-08-30 ENCOUNTER — Encounter (HOSPITAL_BASED_OUTPATIENT_CLINIC_OR_DEPARTMENT_OTHER): Payer: Self-pay | Admitting: Orthopedic Surgery

## 2015-01-31 ENCOUNTER — Other Ambulatory Visit: Payer: Self-pay

## 2015-01-31 DIAGNOSIS — Z1231 Encounter for screening mammogram for malignant neoplasm of breast: Secondary | ICD-10-CM

## 2015-05-01 LAB — HM MAMMOGRAPHY

## 2015-10-03 ENCOUNTER — Encounter: Payer: Self-pay | Admitting: Internal Medicine

## 2015-10-03 ENCOUNTER — Ambulatory Visit (INDEPENDENT_AMBULATORY_CARE_PROVIDER_SITE_OTHER): Payer: 59 | Admitting: Internal Medicine

## 2015-10-03 VITALS — BP 130/90 | HR 67 | Ht 63.0 in | Wt 173.0 lb

## 2015-10-03 DIAGNOSIS — G4700 Insomnia, unspecified: Secondary | ICD-10-CM | POA: Insufficient documentation

## 2015-10-03 DIAGNOSIS — Z0000 Encounter for general adult medical examination without abnormal findings: Secondary | ICD-10-CM

## 2015-10-03 DIAGNOSIS — R591 Generalized enlarged lymph nodes: Secondary | ICD-10-CM

## 2015-10-03 DIAGNOSIS — R599 Enlarged lymph nodes, unspecified: Secondary | ICD-10-CM | POA: Insufficient documentation

## 2015-10-03 MED ORDER — ZOLPIDEM TARTRATE 5 MG PO TABS: 5.0000 mg | ORAL_TABLET | Freq: Every evening | ORAL | Status: DC | PRN

## 2015-10-03 MED ORDER — NORTRIPTYLINE HCL 10 MG PO CAPS: 10.0000 mg | ORAL_CAPSULE | Freq: Every day | ORAL | Status: DC

## 2015-10-03 MED ORDER — AZITHROMYCIN 250 MG PO TABS: ORAL_TABLET | ORAL | Status: DC

## 2015-10-03 NOTE — Progress Notes (Signed)
Subjective:  Patient ID: Teresa Wells, female    DOB: [birth year]  Age: 55 y.o. MRN: [id number]  CC: No chief complaint on file.   HPI   Teresa Wells presents for new pt well exam C/o stress - Legrand Como has a bladder Ca. C/o insomnia C/o swollen neck gland on the  R x3-4 weeks, NT. No fever  Outpatient Prescriptions Prior to Visit  Medication Sig Dispense Refill  . acetaminophen (TYLENOL) 500 MG tablet Take 1,000 mg by mouth every 6 (six) hours as needed for mild pain or moderate pain.     . fluticasone (FLONASE) 50 MCG/ACT nasal spray Place 1 spray into both nostrils daily.    Marland Kitchen ibuprofen (ADVIL,MOTRIN) 200 MG tablet Take 200 mg by mouth every 6 (six) hours as needed.    Marland Kitchen PRESCRIPTION MEDICATION     . PRESCRIPTION MEDICATION every other day.    . cetirizine (ZYRTEC) 5 MG tablet Take 5 mg by mouth daily. Reported on 10/03/2015    . Multiple Vitamin (MULTIVITAMIN) tablet Take 1 tablet by mouth daily. Reported on 10/03/2015    . HYDROcodone-acetaminophen (NORCO) 5-325 MG per tablet Take 1-2 tablets by mouth every 4 (four) hours as needed for moderate pain. (Patient not taking: Reported on 10/03/2015) 40 tablet 0  . HYDROcodone-acetaminophen (NORCO) 5-325 MG per tablet Take 1-2 tablets by mouth every 6 (six) hours as needed for severe pain. (Patient not taking: Reported on 10/03/2015) 30 tablet 0  . IODINE, KELP, PO Take by mouth. Reported on 10/03/2015     Facility-Administered Medications Prior to Visit  Medication Dose Route Frequency Provider Last Rate Last Dose  . TDaP (BOOSTRIX) injection 0.5 mL  0.5 mL Intramuscular Once Lanice Shirts, MD        ROS Review of Systems  Constitutional: Negative for chills, activity change, appetite change, fatigue and unexpected weight change.  HENT: Negative for congestion, mouth sores and sinus pressure.   Eyes: Negative for visual disturbance.  Respiratory: Negative for cough and chest tightness.   Gastrointestinal: Negative for nausea  and abdominal pain.  Genitourinary: Negative for frequency, difficulty urinating and vaginal pain.  Musculoskeletal: Negative for back pain and gait problem.  Skin: Negative for pallor and rash.  Neurological: Negative for dizziness, tremors, weakness, numbness and headaches.  Psychiatric/Behavioral: Positive for sleep disturbance. Negative for suicidal ideas and confusion. The patient is nervous/anxious.     Objective:  BP 130/90 mmHg  Pulse 67  Ht 5\' 3"  (1.6 m)  Wt 173 lb (78.472 kg)  BMI 30.65 kg/m2  SpO2 97%  BP Readings from Last 3 Encounters:  10/03/15 130/90  08/27/14 149/69  08/27/14 124/67    Wt Readings from Last 3 Encounters:  10/03/15 173 lb (78.472 kg)  08/27/14 173 lb 8 oz (78.699 kg)  12/23/13 163 lb (73.936 kg)    Physical Exam  Constitutional: She appears well-developed. No distress.  HENT:  Head: Normocephalic.  Right Ear: External ear normal.  Left Ear: External ear normal.  Nose: Nose normal.  Mouth/Throat: Oropharynx is clear and moist.  Eyes: Conjunctivae are normal. Pupils are equal, round, and reactive to light. Right eye exhibits no discharge. Left eye exhibits no discharge.  Neck: Normal range of motion. Neck supple. No JVD present. No tracheal deviation present. No thyromegaly present.  Cardiovascular: Normal rate, regular rhythm and normal heart sounds.   Pulmonary/Chest: No stridor. No respiratory distress. She has no wheezes.  Abdominal: Soft. Bowel sounds are normal. She exhibits no distension  and no mass. There is no tenderness. There is no rebound and no guarding.  Musculoskeletal: She exhibits no edema or tenderness.  Lymphadenopathy:    She has no cervical adenopathy.  Neurological: She displays normal reflexes. No cranial nerve deficit. She exhibits normal muscle tone. Coordination normal.  Skin: No rash noted. No erythema.  Psychiatric: She has a normal mood and affect. Her behavior is normal. Judgment and thought content normal.  R  tonsillar adenopathy - flat, 1.5x1.5 cm at the jaw angle  Lab Results  Component Value Date   WBC 9.1 11/06/2013   HGB 14.8 08/27/2014   HCT 46.4* 11/06/2013   PLT 275 11/06/2013   GLUCOSE 67* 11/06/2013   CHOL 244* 12/23/2013   TRIG 76 12/23/2013   HDL 71 12/23/2013   LDLCALC 158* 12/23/2013   ALT 24 11/06/2013   AST 24 11/06/2013   NA 142 11/06/2013   K 4.5 11/06/2013   CL 100 11/06/2013   CREATININE 0.68 11/06/2013   BUN 15 11/06/2013   CO2 30 11/06/2013   TSH 3.927 12/23/2013   INR 0.94 11/06/2013    No results found.  Assessment & Plan:   Diagnoses and all orders for this visit:  Well adult exam -     Basic metabolic panel; Future -     CBC with Differential/Platelet; Future -     TSH; Future -     Urinalysis; Future -     Hepatitis C antibody; Future -     Hepatic function panel; Future -     Lipid panel; Future  Insomnia  Adenopathy -     CBC with Differential/Platelet; Future  Other orders -     Cancel: zolpidem (AMBIEN) 5 MG tablet; Take 1 tablet (5 mg total) by mouth at bedtime as needed. -     zolpidem (AMBIEN) 5 MG tablet; Take 1 tablet (5 mg total) by mouth at bedtime as needed. -     nortriptyline (PAMELOR) 10 MG capsule; Take 1 capsule (10 mg total) by mouth at bedtime. -     azithromycin (ZITHROMAX Z-PAK) 250 MG tablet; As directed  I have discontinued Teresa Wells PRESCRIPTION MEDICATION, PRESCRIPTION MEDICATION, (IODINE, KELP, PO), HYDROcodone-acetaminophen, and HYDROcodone-acetaminophen. I have also changed her zolpidem. Additionally, I am having her start on nortriptyline and azithromycin. Lastly, I am having her maintain her multivitamin, acetaminophen, cetirizine, fluticasone, and ibuprofen. We will continue to administer Tdap.  Meds ordered this encounter  Medications  . DISCONTD: zolpidem (AMBIEN) 5 MG tablet    Sig: Take 1 tablet by mouth at bedtime as needed.  . zolpidem (AMBIEN) 5 MG tablet    Sig: Take 1 tablet (5 mg total) by  mouth at bedtime as needed.    Dispense:  30 tablet    Refill:  2  . nortriptyline (PAMELOR) 10 MG capsule    Sig: Take 1 capsule (10 mg total) by mouth at bedtime.    Dispense:  30 capsule    Refill:  5  . azithromycin (ZITHROMAX Z-PAK) 250 MG tablet    Sig: As directed    Dispense:  6 each    Refill:  0     Follow-up: Return in about 4 weeks (around 10/31/2015) for a follow-up visit.  Walker Kehr, MD

## 2015-10-03 NOTE — Assessment & Plan Note (Signed)
6/17 R tonsillar adenopathy

## 2015-10-03 NOTE — Progress Notes (Signed)
Pre visit review using our clinic review tool, if applicable. No additional management support is needed unless otherwise documented below in the visit note. 

## 2015-10-03 NOTE — Assessment & Plan Note (Signed)
Nortriptyline at hs Zolpidem prn  Potential benefits of a long term benzodiazepines  use as well as potential risks  and complications were explained to the patient and were aknowledged.

## 2015-10-04 ENCOUNTER — Other Ambulatory Visit (INDEPENDENT_AMBULATORY_CARE_PROVIDER_SITE_OTHER): Payer: 59

## 2015-10-04 DIAGNOSIS — R591 Generalized enlarged lymph nodes: Secondary | ICD-10-CM

## 2015-10-04 DIAGNOSIS — Z0000 Encounter for general adult medical examination without abnormal findings: Secondary | ICD-10-CM

## 2015-10-04 DIAGNOSIS — R599 Enlarged lymph nodes, unspecified: Secondary | ICD-10-CM

## 2015-10-04 LAB — BASIC METABOLIC PANEL
BUN: 6 mg/dL (ref 6–23)
CALCIUM: 9.2 mg/dL (ref 8.4–10.5)
CO2: 30 meq/L (ref 19–32)
CREATININE: 0.68 mg/dL (ref 0.40–1.20)
Chloride: 102 mEq/L (ref 96–112)
GFR: 94.82 mL/min (ref >60.00)
GLUCOSE: 87 mg/dL (ref 70–99)
Potassium: 3.8 mEq/L (ref 3.5–5.1)
Sodium: 139 mEq/L (ref 135–145)

## 2015-10-04 LAB — HEPATIC FUNCTION PANEL
ALK PHOS: 89 U/L (ref 39–117)
ALT: 16 U/L (ref 0–35)
AST: 18 U/L (ref 0–37)
Albumin: 4.2 g/dL (ref 3.5–5.2)
BILIRUBIN DIRECT: 0.1 mg/dL (ref 0.0–0.3)
Total Bilirubin: 0.7 mg/dL (ref 0.2–1.2)
Total Protein: 7 g/dL (ref 6.0–8.3)

## 2015-10-04 LAB — CBC WITH DIFFERENTIAL/PLATELET
BASOS ABS: 0 10*3/uL (ref 0.0–0.1)
Basophils Relative: 0.7 % (ref 0.0–3.0)
EOS ABS: 0.1 10*3/uL (ref 0.0–0.7)
Eosinophils Relative: 1.8 % (ref 0.0–5.0)
HCT: 44.6 % (ref 36.0–46.0)
Hemoglobin: 15.1 g/dL — ABNORMAL HIGH (ref 12.0–15.0)
LYMPHS ABS: 2.5 10*3/uL (ref 0.7–4.0)
Lymphocytes Relative: 35 % (ref 12.0–46.0)
MCHC: 33.7 g/dL (ref 30.0–36.0)
MCV: 90.3 fl (ref 78.0–100.0)
Monocytes Absolute: 0.5 10*3/uL (ref 0.1–1.0)
Monocytes Relative: 7.3 % (ref 3.0–12.0)
NEUTROS ABS: 4 10*3/uL (ref 1.4–7.7)
NEUTROS PCT: 55.2 % (ref 43.0–77.0)
PLATELETS: 297 10*3/uL (ref 150.0–400.0)
RBC: 4.94 Mil/uL (ref 3.87–5.11)
RDW: 13.3 % (ref 11.5–15.5)
WBC: 7.2 10*3/uL (ref 4.0–10.5)

## 2015-10-04 LAB — URINALYSIS
BILIRUBIN URINE: NEGATIVE
HGB URINE DIPSTICK: NEGATIVE
Ketones, ur: NEGATIVE
LEUKOCYTES UA: NEGATIVE
NITRITE: NEGATIVE
PH: 7 (ref 5.0–8.0)
Specific Gravity, Urine: <=1.005 — AB (ref 1.000–1.030)
Total Protein, Urine: NEGATIVE
Urine Glucose: NEGATIVE
Urobilinogen, UA: 0.2 (ref 0.0–1.0)

## 2015-10-04 LAB — LIPID PANEL
CHOL/HDL RATIO: 4
Cholesterol: 231 mg/dL — ABNORMAL HIGH (ref 0–200)
HDL: 57 mg/dL (ref >39.00)
LDL CALC: 157 mg/dL — AB (ref 0–99)
NONHDL: 173.97
Triglycerides: 84 mg/dL (ref 0.0–149.0)
VLDL: 16.8 mg/dL (ref 0.0–40.0)

## 2015-10-04 LAB — TSH: TSH: 3.04 u[IU]/mL (ref 0.35–4.50)

## 2015-10-05 LAB — HEPATITIS C ANTIBODY: HCV AB: NEGATIVE

## 2015-10-07 ENCOUNTER — Telehealth: Payer: Self-pay | Admitting: Internal Medicine

## 2015-10-07 NOTE — Telephone Encounter (Signed)
Please call pt at [phone] regarding her lab work.

## 2015-10-07 NOTE — Telephone Encounter (Signed)
Pt informed of results.

## 2015-10-28 ENCOUNTER — Ambulatory Visit: Payer: 59 | Admitting: Internal Medicine

## 2016-05-23 DIAGNOSIS — G4700 Insomnia, unspecified: Secondary | ICD-10-CM | POA: Diagnosis not present

## 2016-05-23 DIAGNOSIS — R05 Cough: Secondary | ICD-10-CM | POA: Diagnosis not present

## 2016-05-23 DIAGNOSIS — J0101 Acute recurrent maxillary sinusitis: Secondary | ICD-10-CM | POA: Diagnosis not present

## 2016-06-26 DIAGNOSIS — N951 Menopausal and female climacteric states: Secondary | ICD-10-CM | POA: Diagnosis not present

## 2016-06-28 DIAGNOSIS — E538 Deficiency of other specified B group vitamins: Secondary | ICD-10-CM | POA: Diagnosis not present

## 2016-06-28 DIAGNOSIS — E039 Hypothyroidism, unspecified: Secondary | ICD-10-CM | POA: Diagnosis not present

## 2016-06-28 DIAGNOSIS — N958 Other specified menopausal and perimenopausal disorders: Secondary | ICD-10-CM | POA: Diagnosis not present

## 2016-07-25 DIAGNOSIS — N958 Other specified menopausal and perimenopausal disorders: Secondary | ICD-10-CM | POA: Diagnosis not present

## 2016-08-10 DIAGNOSIS — N950 Postmenopausal bleeding: Secondary | ICD-10-CM | POA: Diagnosis not present

## 2016-08-10 DIAGNOSIS — Z01419 Encounter for gynecological examination (general) (routine) without abnormal findings: Secondary | ICD-10-CM | POA: Diagnosis not present

## 2016-08-13 ENCOUNTER — Encounter: Payer: Self-pay | Admitting: Obstetrics and Gynecology

## 2016-08-13 DIAGNOSIS — N950 Postmenopausal bleeding: Secondary | ICD-10-CM | POA: Diagnosis not present

## 2016-08-20 ENCOUNTER — Other Ambulatory Visit: Payer: Self-pay | Admitting: Obstetrics and Gynecology

## 2016-08-20 DIAGNOSIS — N950 Postmenopausal bleeding: Secondary | ICD-10-CM | POA: Diagnosis not present

## 2016-09-01 DIAGNOSIS — J0101 Acute recurrent maxillary sinusitis: Secondary | ICD-10-CM | POA: Diagnosis not present

## 2016-09-01 DIAGNOSIS — R05 Cough: Secondary | ICD-10-CM | POA: Diagnosis not present

## 2016-09-11 DIAGNOSIS — N950 Postmenopausal bleeding: Secondary | ICD-10-CM | POA: Diagnosis not present

## 2017-03-30 DIAGNOSIS — G4700 Insomnia, unspecified: Secondary | ICD-10-CM | POA: Diagnosis not present

## 2017-03-30 DIAGNOSIS — J0100 Acute maxillary sinusitis, unspecified: Secondary | ICD-10-CM | POA: Diagnosis not present

## 2017-06-24 ENCOUNTER — Emergency Department (HOSPITAL_COMMUNITY): Payer: 59

## 2017-06-24 ENCOUNTER — Encounter (HOSPITAL_COMMUNITY): Payer: Self-pay | Admitting: Emergency Medicine

## 2017-06-24 ENCOUNTER — Emergency Department (HOSPITAL_COMMUNITY)
Admission: EM | Admit: 2017-06-24 | Discharge: 2017-06-24 | Disposition: A | Discharge status: Home / Self Care | Payer: 59 | Attending: Emergency Medicine | Admitting: Emergency Medicine

## 2017-06-24 DIAGNOSIS — Z5321 Procedure and treatment not carried out due to patient leaving prior to being seen by health care provider: Secondary | ICD-10-CM | POA: Diagnosis not present

## 2017-06-24 DIAGNOSIS — R0602 Shortness of breath: Secondary | ICD-10-CM | POA: Diagnosis not present

## 2017-06-24 DIAGNOSIS — R6889 Other general symptoms and signs: Secondary | ICD-10-CM | POA: Diagnosis not present

## 2017-06-24 DIAGNOSIS — J189 Pneumonia, unspecified organism: Secondary | ICD-10-CM | POA: Insufficient documentation

## 2017-06-24 DIAGNOSIS — R05 Cough: Secondary | ICD-10-CM | POA: Diagnosis not present

## 2017-06-24 LAB — URINALYSIS, ROUTINE W REFLEX MICROSCOPIC
BILIRUBIN URINE: NEGATIVE
GLUCOSE, UA: NEGATIVE mg/dL
KETONES UR: NEGATIVE mg/dL
LEUKOCYTES UA: NEGATIVE
NITRITE: NEGATIVE
PH: 6 (ref 5.0–8.0)
Protein, ur: NEGATIVE mg/dL
Specific Gravity, Urine: 1.003 — ABNORMAL LOW (ref 1.005–1.030)

## 2017-06-24 LAB — CBC WITH DIFFERENTIAL/PLATELET
BASOS ABS: 0 10*3/uL (ref 0.0–0.1)
BASOS PCT: 0 %
Eosinophils Absolute: 0.2 10*3/uL (ref 0.0–0.7)
Eosinophils Relative: 1 %
HEMATOCRIT: 39.7 % (ref 36.0–46.0)
Hemoglobin: 13.3 g/dL (ref 12.0–15.0)
Lymphocytes Relative: 14 %
Lymphs Abs: 2.3 10*3/uL (ref 0.7–4.0)
MCH: 30.6 pg (ref 26.0–34.0)
MCHC: 33.5 g/dL (ref 30.0–36.0)
MCV: 91.5 fL (ref 78.0–100.0)
MONO ABS: 1.4 10*3/uL — AB (ref 0.1–1.0)
Monocytes Relative: 8 %
NEUTROS ABS: 13 10*3/uL — AB (ref 1.7–7.7)
NEUTROS PCT: 77 %
Platelets: 408 10*3/uL — ABNORMAL HIGH (ref 150–400)
RBC: 4.34 MIL/uL (ref 3.87–5.11)
RDW: 13.4 % (ref 11.5–15.5)
WBC: 16.9 10*3/uL — ABNORMAL HIGH (ref 4.0–10.5)

## 2017-06-24 LAB — COMPREHENSIVE METABOLIC PANEL
ALK PHOS: 291 U/L — AB (ref 38–126)
ALT: 55 U/L — ABNORMAL HIGH (ref 14–54)
ANION GAP: 14 (ref 5–15)
AST: 36 U/L (ref 15–41)
Albumin: 3.4 g/dL — ABNORMAL LOW (ref 3.5–5.0)
BILIRUBIN TOTAL: 0.9 mg/dL (ref 0.3–1.2)
BUN: 7 mg/dL (ref 6–20)
CALCIUM: 8.6 mg/dL — AB (ref 8.9–10.3)
CO2: 25 mmol/L (ref 22–32)
Chloride: 99 mmol/L — ABNORMAL LOW (ref 101–111)
Creatinine, Ser: 0.82 mg/dL (ref 0.44–1.00)
GFR calc non Af Amer: >60 mL/min (ref >60)
GLUCOSE: 105 mg/dL — AB (ref 65–99)
Potassium: 3.2 mmol/L — ABNORMAL LOW (ref 3.5–5.1)
Sodium: 138 mmol/L (ref 135–145)
TOTAL PROTEIN: 7.7 g/dL (ref 6.5–8.1)

## 2017-06-24 LAB — I-STAT CG4 LACTIC ACID, ED: Lactic Acid, Venous: 0.74 mmol/L (ref 0.5–1.9)

## 2017-06-24 NOTE — ED Triage Notes (Addendum)
Patient reports she was seen at clinic in [place] and chest xray showed PNA. Patient been on two different antibiotics already. Reports her WBC was elevated.

## 2017-06-24 NOTE — ED Notes (Signed)
No one responded when I called to recheck vitals

## 2017-06-25 ENCOUNTER — Ambulatory Visit: Payer: 59 | Admitting: Family Medicine

## 2017-06-25 ENCOUNTER — Telehealth: Payer: Self-pay | Admitting: Internal Medicine

## 2017-06-25 ENCOUNTER — Encounter: Payer: Self-pay | Admitting: Family Medicine

## 2017-06-25 ENCOUNTER — Other Ambulatory Visit: Payer: Self-pay

## 2017-06-25 VITALS — BP 116/68 | HR 95 | Temp 99.9°F | Resp 18 | Ht 62.0 in | Wt 187.8 lb

## 2017-06-25 DIAGNOSIS — J189 Pneumonia, unspecified organism: Secondary | ICD-10-CM | POA: Diagnosis not present

## 2017-06-25 MED ORDER — MOXIFLOXACIN HCL 400 MG PO TABS: 400.0000 mg | ORAL_TABLET | Freq: Every day | ORAL | 0 refills | Status: DC

## 2017-06-25 MED ORDER — GUAIFENESIN-CODEINE 100-10 MG/5ML PO SOLN: 5.0000 mL | Freq: Four times a day (QID) | ORAL | 0 refills | Status: DC | PRN

## 2017-06-25 NOTE — Telephone Encounter (Signed)
Okay with me.  Thank you °

## 2017-06-25 NOTE — Patient Instructions (Signed)
Please return in 1 week with me for recheck.  If you have any questions or concerns, please don't hesitate to send me a message via MyChart or call the office at [phone]. Thank you for visiting with [place] today! It's our pleasure caring for you.

## 2017-06-25 NOTE — Telephone Encounter (Signed)
Pt would like to transfer care from the [place] office to the [place] office due to [place] being closer to her home, Please advise ok to schedule.

## 2017-06-25 NOTE — Progress Notes (Signed)
Subjective  CC:  Chief Complaint  Patient presents with  . Cough    right sided chest pain, pneumonia, has had for 2 months, 2 rounds of antibiotics    HPI: Teresa Wells is a 57 y.o. female who presents to the office today to address the problems listed above in the chief complaint.  This is a 57 year old female who presents for help with pneumonia.  She reports that she has been sick for the last 3 weeks.  Initially she was seen by her dentist and given a Z-Pak due to a dental infection.  At that time she was not sick.  About 48 hours after that visit she came down with flulike symptoms.  She had dental care with her dentist and due to her sinus symptoms and cough he gave her Avelox for presumed sinus infection.  Over the last 2 weeks she complains of productive cough pleuritic chest pain body aches and fevers.  She is also had sweats and chills.  She is taking Avelox 400 mg daily for the last 5 days and is out of medicines because she shared with her husband.  She was seen yesterday at an urgent care and a chest x-ray was done that showed bilateral patchy pneumonia.  She was sent to the emergency room where chest x-ray was repeated and blood work was done.  White blood count was 19.1 she left without being seen due to a lengthy weight.  She is here today for follow-up.  She feels that she is improving, less chest pain, no longer having fevers but still has chest congestion and cough.  I reviewed the patients updated PMH, FH, and SocHx.    Patient Active Problem List   Diagnosis Date Noted  . Insomnia 10/03/2015  . Adenopathy 10/03/2015  . Incisional hernia, without obstruction or gangrene 09/24/2013  . Breast cyst 09/12/2011  . Meniscus tear 09/12/2011  . Allergic rhinitis due to allergen 09/12/2011  . Menopause 09/12/2011  . Menopausal hot flushes 09/12/2011  . Elevated BP 09/12/2011  . Ovarian cyst 09/12/2011  . Hyperlipidemia 09/12/2011   Current Meds  Medication Sig  .  acetaminophen (TYLENOL) 500 MG tablet Take 1,000 mg by mouth every 6 (six) hours as needed for mild pain or moderate pain.   . cetirizine (ZYRTEC) 5 MG tablet Take 5 mg by mouth daily. Reported on 10/03/2015  . fluticasone (FLONASE) 50 MCG/ACT nasal spray Place 1 spray into both nostrils daily.  Marland Kitchen ibuprofen (ADVIL,MOTRIN) 200 MG tablet Take 200 mg by mouth every 6 (six) hours as needed.  . Multiple Vitamin (MULTIVITAMIN) tablet Take 1 tablet by mouth daily. Reported on 10/03/2015   Current Facility-Administered Medications for the 06/25/17 encounter (Office Visit) with Leamon Arnt, MD  Medication  . TDaP (BOOSTRIX) injection 0.5 mL    Allergies: Patient is allergic to oxycodone; penicillins; and sulfa antibiotics. Family History: Patient family history includes Diabetes in her father and mother; Heart disease in her brother, father, and mother; Hypertension in her brother, father, and mother; Stroke in her father. Social History:  Patient  reports that  has never smoked. she has never used smokeless tobacco. She reports that she drinks alcohol. She reports that she does not use drugs.  Review of Systems: Constitutional: Negative for fever malaise or anorexia Cardiovascular: negative for chest pain Respiratory: negative for SOB Gastrointestinal: negative for abdominal pain  Objective  Vitals: BP 116/68   Pulse 95   Temp 99.9 F (37.7 C) (Oral)  Resp 18   Ht 5\' 2"  (1.575 m)   Wt 187 lb 12.8 oz (85.2 kg)   SpO2 94%   BMI 34.35 kg/m  General: no acute distress , A&Ox3, no respiratory distress HEENT: PEERL, conjunctiva normal, Oropharynx moist,neck is supple Cardiovascular:  RRR without murmur or gallop.  Respiratory:  Good breath sounds bilaterally, CTAB with normal respiratory effort no rales or wheezing Skin:  Warm, no rashes No visits with results within 1 Day(s) from this visit.  Latest known visit with results is:  Admission on 06/24/2017, Discharged on 06/24/2017    Component Date Value Ref Range Status  . Lactic Acid, Venous 06/24/2017 0.74  0.5 - 1.9 mmol/L Final  . Sodium 06/24/2017 138  135 - 145 mmol/L Final  . Potassium 06/24/2017 3.2* 3.5 - 5.1 mmol/L Final  . Chloride 06/24/2017 99* 101 - 111 mmol/L Final  . CO2 06/24/2017 25  22 - 32 mmol/L Final  . Glucose, Bld 06/24/2017 105* 65 - 99 mg/dL Final  . BUN 06/24/2017 7  6 - 20 mg/dL Final  . Creatinine, Ser 06/24/2017 0.82  0.44 - 1.00 mg/dL Final  . Calcium 06/24/2017 8.6* 8.9 - 10.3 mg/dL Final  . Total Protein 06/24/2017 7.7  6.5 - 8.1 g/dL Final  . Albumin 06/24/2017 3.4* 3.5 - 5.0 g/dL Final  . AST 06/24/2017 36  15 - 41 U/L Final  . ALT 06/24/2017 55* 14 - 54 U/L Final  . Alkaline Phosphatase 06/24/2017 291* 38 - 126 U/L Final  . Total Bilirubin 06/24/2017 0.9  0.3 - 1.2 mg/dL Final  . GFR calc non Af Amer 06/24/2017 >60  >60 mL/min Final  . GFR calc Af Amer 06/24/2017 >60  >60 mL/min Final  . Anion gap 06/24/2017 14  5 - 15 Final  . WBC 06/24/2017 16.9* 4.0 - 10.5 K/uL Final  . RBC 06/24/2017 4.34  3.87 - 5.11 MIL/uL Final  . Hemoglobin 06/24/2017 13.3  12.0 - 15.0 g/dL Final  . HCT 06/24/2017 39.7  36.0 - 46.0 % Final  . MCV 06/24/2017 91.5  78.0 - 100.0 fL Final  . MCH 06/24/2017 30.6  26.0 - 34.0 pg Final  . MCHC 06/24/2017 33.5  30.0 - 36.0 g/dL Final  . RDW 06/24/2017 13.4  11.5 - 15.5 % Final  . Platelets 06/24/2017 408* 150 - 400 K/uL Final  . Neutrophils Relative % 06/24/2017 77  % Final  . Neutro Abs 06/24/2017 13.0* 1.7 - 7.7 K/uL Final  . Lymphocytes Relative 06/24/2017 14  % Final  . Lymphs Abs 06/24/2017 2.3  0.7 - 4.0 K/uL Final  . Monocytes Relative 06/24/2017 8  % Final  . Monocytes Absolute 06/24/2017 1.4* 0.1 - 1.0 K/uL Final  . Eosinophils Relative 06/24/2017 1  % Final  . Eosinophils Absolute 06/24/2017 0.2  0.0 - 0.7 K/uL Final  . Basophils Relative 06/24/2017 0  % Final  . Basophils Absolute 06/24/2017 0.0  0.0 - 0.1 K/uL Final  . Color, Urine  06/24/2017 YELLOW  YELLOW Final  . APPearance 06/24/2017 CLEAR  CLEAR Final  . Specific Gravity, Urine 06/24/2017 1.003* 1.005 - 1.030 Final  . pH 06/24/2017 6.0  5.0 - 8.0 Final  . Glucose, UA 06/24/2017 NEGATIVE  NEGATIVE mg/dL Final  . Hgb urine dipstick 06/24/2017 SMALL* NEGATIVE Final  . Bilirubin Urine 06/24/2017 NEGATIVE  NEGATIVE Final  . Ketones, ur 06/24/2017 NEGATIVE  NEGATIVE mg/dL Final  . Protein, ur 06/24/2017 NEGATIVE  NEGATIVE mg/dL Final  . Nitrite 06/24/2017 NEGATIVE  NEGATIVE Final  . Leukocytes, UA 06/24/2017 NEGATIVE  NEGATIVE Final  . RBC / HPF 06/24/2017 0-5  0 - 5 RBC/hpf Final  . WBC, UA 06/24/2017 0-5  0 - 5 WBC/hpf Final  . Bacteria, UA 06/24/2017 RARE* NONE SEEN Final  . Squamous Epithelial / LPF 06/24/2017 6-30* NONE SEEN Final    Assessment  1. Community acquired pneumonia, unspecified laterality      Plan   Bilateral community-acquired pneumonia: We will complete total course of antibiotics, refilled Avelox.  Patient seems to be improving.  She is not significantly hypoxic.  Counseling done.  Complete antibiotics.  Start cough medicines.  Push fluids and increase rest.  Recheck in 1 week.  We will recheck blood test at that time.  Follow up: 1 week   Commons side effects, risks, benefits, and alternatives for medications and treatment plan prescribed today were discussed, and the patient expressed understanding of the given instructions. Patient is instructed to call or message via MyChart if he/she has any questions or concerns regarding our treatment plan. No barriers to understanding were identified. We discussed Red Flag symptoms and signs in detail. Patient expressed understanding regarding what to do in case of urgent or emergency type symptoms.   Medication list was reconciled, printed and provided to the patient in AVS. Patient instructions and summary information was reviewed with the patient as documented in the AVS. This note was prepared with  assistance of Dragon voice recognition software. Occasional wrong-word or sound-a-like substitutions may have occurred due to the inherent limitations of voice recognition software  No orders of the defined types were placed in this encounter.  Meds ordered this encounter  Medications  . moxifloxacin (AVELOX) 400 MG tablet    Sig: Take 1 tablet (400 mg total) by mouth daily at 8 pm.    Dispense:  5 tablet    Refill:  0  . guaiFENesin-codeine 100-10 MG/5ML syrup    Sig: Take 5 mLs by mouth every 6 (six) hours as needed for cough.    Dispense:  120 mL    Refill:  0

## 2017-06-26 NOTE — Telephone Encounter (Signed)
Sounds fine. Please schedule new patient visit.  (in addition to f/u visit with me already scheduled).  Thanks.

## 2017-06-26 NOTE — Telephone Encounter (Signed)
Pt has been scheduled.  °

## 2017-07-02 ENCOUNTER — Emergency Department (HOSPITAL_COMMUNITY): Payer: 59

## 2017-07-02 ENCOUNTER — Encounter (HOSPITAL_COMMUNITY): Payer: Self-pay | Admitting: *Deleted

## 2017-07-02 ENCOUNTER — Inpatient Hospital Stay (HOSPITAL_COMMUNITY)
Admission: EM | Admit: 2017-07-02 | Discharge: 2017-07-05 | DRG: 189 | Disposition: A | Discharge status: Home / Self Care | Payer: 59 | Attending: Internal Medicine | Admitting: Internal Medicine

## 2017-07-02 ENCOUNTER — Encounter: Payer: Self-pay | Admitting: Family Medicine

## 2017-07-02 ENCOUNTER — Ambulatory Visit: Payer: 59 | Admitting: Family Medicine

## 2017-07-02 ENCOUNTER — Other Ambulatory Visit: Payer: Self-pay

## 2017-07-02 VITALS — BP 130/70 | HR 97 | Temp 99.4°F | Resp 18 | Ht 62.0 in | Wt 186.8 lb

## 2017-07-02 DIAGNOSIS — R599 Enlarged lymph nodes, unspecified: Secondary | ICD-10-CM | POA: Diagnosis present

## 2017-07-02 DIAGNOSIS — R591 Generalized enlarged lymph nodes: Secondary | ICD-10-CM | POA: Diagnosis present

## 2017-07-02 DIAGNOSIS — E876 Hypokalemia: Secondary | ICD-10-CM | POA: Diagnosis not present

## 2017-07-02 DIAGNOSIS — B370 Candidal stomatitis: Secondary | ICD-10-CM | POA: Diagnosis present

## 2017-07-02 DIAGNOSIS — J849 Interstitial pulmonary disease, unspecified: Secondary | ICD-10-CM | POA: Diagnosis not present

## 2017-07-02 DIAGNOSIS — R0602 Shortness of breath: Secondary | ICD-10-CM | POA: Diagnosis not present

## 2017-07-02 DIAGNOSIS — Z79899 Other long term (current) drug therapy: Secondary | ICD-10-CM

## 2017-07-02 DIAGNOSIS — R945 Abnormal results of liver function studies: Secondary | ICD-10-CM | POA: Diagnosis not present

## 2017-07-02 DIAGNOSIS — J9601 Acute respiratory failure with hypoxia: Secondary | ICD-10-CM | POA: Diagnosis not present

## 2017-07-02 DIAGNOSIS — J189 Pneumonia, unspecified organism: Secondary | ICD-10-CM

## 2017-07-02 DIAGNOSIS — R509 Fever, unspecified: Secondary | ICD-10-CM | POA: Diagnosis not present

## 2017-07-02 DIAGNOSIS — R0989 Other specified symptoms and signs involving the circulatory and respiratory systems: Secondary | ICD-10-CM

## 2017-07-02 DIAGNOSIS — R05 Cough: Secondary | ICD-10-CM | POA: Diagnosis not present

## 2017-07-02 DIAGNOSIS — E860 Dehydration: Secondary | ICD-10-CM

## 2017-07-02 DIAGNOSIS — J180 Bronchopneumonia, unspecified organism: Secondary | ICD-10-CM | POA: Diagnosis not present

## 2017-07-02 DIAGNOSIS — J9690 Respiratory failure, unspecified, unspecified whether with hypoxia or hypercapnia: Secondary | ICD-10-CM

## 2017-07-02 DIAGNOSIS — R092 Respiratory arrest: Secondary | ICD-10-CM | POA: Diagnosis not present

## 2017-07-02 DIAGNOSIS — R0902 Hypoxemia: Secondary | ICD-10-CM

## 2017-07-02 DIAGNOSIS — R918 Other nonspecific abnormal finding of lung field: Secondary | ICD-10-CM | POA: Diagnosis not present

## 2017-07-02 LAB — CBC WITH DIFFERENTIAL/PLATELET
BASOS ABS: 0 10*3/uL (ref 0.0–0.1)
Basophils Relative: 0 %
EOS ABS: 0.2 10*3/uL (ref 0.0–0.7)
Eosinophils Relative: 1 %
HEMATOCRIT: 37.7 % (ref 36.0–46.0)
Hemoglobin: 12.6 g/dL (ref 12.0–15.0)
Lymphocytes Relative: 10 %
Lymphs Abs: 2.2 10*3/uL (ref 0.7–4.0)
MCH: 30.3 pg (ref 26.0–34.0)
MCHC: 33.4 g/dL (ref 30.0–36.0)
MCV: 90.6 fL (ref 78.0–100.0)
MONO ABS: 1.1 10*3/uL — AB (ref 0.1–1.0)
MONOS PCT: 5 %
NEUTROS ABS: 18.2 10*3/uL — AB (ref 1.7–7.7)
Neutrophils Relative %: 84 %
PLATELETS: 468 10*3/uL — AB (ref 150–400)
RBC: 4.16 MIL/uL (ref 3.87–5.11)
RDW: 13.9 % (ref 11.5–15.5)
WBC: 21.7 10*3/uL — AB (ref 4.0–10.5)

## 2017-07-02 LAB — RAPID HIV SCREEN (HIV 1/2 AB+AG)
HIV 1/2 Antibodies: NONREACTIVE
HIV-1 P24 Antigen - HIV24: NONREACTIVE

## 2017-07-02 LAB — SEDIMENTATION RATE: SED RATE: 120 mm/h — AB (ref 0–22)

## 2017-07-02 LAB — BASIC METABOLIC PANEL
Anion gap: 13 (ref 5–15)
BUN: 6 mg/dL (ref 6–20)
CALCIUM: 7.9 mg/dL — AB (ref 8.9–10.3)
CO2: 29 mmol/L (ref 22–32)
CREATININE: 0.9 mg/dL (ref 0.44–1.00)
Chloride: 91 mmol/L — ABNORMAL LOW (ref 101–111)
GFR calc Af Amer: >60 mL/min (ref >60)
GFR calc non Af Amer: >60 mL/min (ref >60)
GLUCOSE: 111 mg/dL — AB (ref 65–99)
POTASSIUM: 2.7 mmol/L — AB (ref 3.5–5.1)
SODIUM: 133 mmol/L — AB (ref 135–145)

## 2017-07-02 LAB — I-STAT TROPONIN, ED: TROPONIN I, POC: 0.04 ng/mL (ref 0.00–0.08)

## 2017-07-02 LAB — C-REACTIVE PROTEIN: CRP: 35.2 mg/dL — ABNORMAL HIGH (ref <1.0)

## 2017-07-02 LAB — I-STAT VENOUS BLOOD GAS, ED
ACID-BASE EXCESS: 7 mmol/L — AB (ref 0.0–2.0)
Bicarbonate: 33.3 mmol/L — ABNORMAL HIGH (ref 20.0–28.0)
O2 SAT: 59 %
PCO2 VEN: 51.7 mmHg (ref 44.0–60.0)
TCO2: 35 mmol/L — AB (ref 22–32)
pH, Ven: 7.417 (ref 7.250–7.430)
pO2, Ven: 31 mmHg — CL (ref 32.0–45.0)

## 2017-07-02 LAB — TSH: TSH: 4.307 u[IU]/mL (ref 0.350–4.500)

## 2017-07-02 LAB — URINALYSIS, ROUTINE W REFLEX MICROSCOPIC
Bilirubin Urine: NEGATIVE
GLUCOSE, UA: NEGATIVE mg/dL
Ketones, ur: NEGATIVE mg/dL
LEUKOCYTES UA: NEGATIVE
Nitrite: NEGATIVE
PH: 7 (ref 5.0–8.0)
Protein, ur: NEGATIVE mg/dL
Specific Gravity, Urine: 1.017 (ref 1.005–1.030)

## 2017-07-02 LAB — RAPID URINE DRUG SCREEN, HOSP PERFORMED
AMPHETAMINES: NOT DETECTED
BARBITURATES: NOT DETECTED
BENZODIAZEPINES: NOT DETECTED
Cocaine: NOT DETECTED
Opiates: POSITIVE — AB
Tetrahydrocannabinol: NOT DETECTED

## 2017-07-02 LAB — I-STAT CG4 LACTIC ACID, ED: Lactic Acid, Venous: 1.53 mmol/L (ref 0.5–1.9)

## 2017-07-02 LAB — HEPATIC FUNCTION PANEL
ALT: 21 U/L (ref 14–54)
AST: 35 U/L (ref 15–41)
Albumin: 2.4 g/dL — ABNORMAL LOW (ref 3.5–5.0)
Alkaline Phosphatase: 190 U/L — ABNORMAL HIGH (ref 38–126)
Bilirubin, Direct: 0.3 mg/dL (ref 0.1–0.5)
Indirect Bilirubin: 0.7 mg/dL (ref 0.3–0.9)
TOTAL PROTEIN: 7 g/dL (ref 6.5–8.1)
Total Bilirubin: 1 mg/dL (ref 0.3–1.2)

## 2017-07-02 LAB — PROCALCITONIN: Procalcitonin: 0.3 ng/mL

## 2017-07-02 LAB — MAGNESIUM: Magnesium: 2.4 mg/dL (ref 1.7–2.4)

## 2017-07-02 LAB — STREP PNEUMONIAE URINARY ANTIGEN: Strep Pneumo Urinary Antigen: NEGATIVE

## 2017-07-02 LAB — HIV ANTIBODY (ROUTINE TESTING W REFLEX): HIV 1&2 Ab, 4th Generation: NEGATIVE

## 2017-07-02 LAB — CK: Total CK: 163 U/L (ref 38–234)

## 2017-07-02 MED ORDER — ADULT MULTIVITAMIN W/MINERALS CH
1.0000 | ORAL_TABLET | Freq: Every day | ORAL | Status: DC
  Administered 2017-07-02 – 2017-07-05 (×4): 1 via ORAL
  Filled 2017-07-02 (×6): qty 1

## 2017-07-02 MED ORDER — SODIUM CHLORIDE 0.9 % IV SOLN
INTRAVENOUS | Status: DC
  Administered 2017-07-02 – 2017-07-04 (×4): via INTRAVENOUS

## 2017-07-02 MED ORDER — POTASSIUM CHLORIDE 10 MEQ/100ML IV SOLN
10.0000 meq | Freq: Once | INTRAVENOUS | Status: AC
  Administered 2017-07-02: 10 meq via INTRAVENOUS
  Filled 2017-07-02: qty 100

## 2017-07-02 MED ORDER — NYSTATIN 100000 UNIT/ML MT SUSP
5.0000 mL | Freq: Four times a day (QID) | OROMUCOSAL | Status: DC
  Administered 2017-07-02 – 2017-07-05 (×10): 500000 [IU] via ORAL
  Filled 2017-07-02 (×15): qty 5

## 2017-07-02 MED ORDER — FLUTICASONE PROPIONATE 50 MCG/ACT NA SUSP: 1.0000 | NASAL | Status: DC | PRN

## 2017-07-02 MED ORDER — IPRATROPIUM-ALBUTEROL 0.5-2.5 (3) MG/3ML IN SOLN: 3.0000 mL | Freq: Once | RESPIRATORY_TRACT | Status: DC

## 2017-07-02 MED ORDER — ENOXAPARIN SODIUM 40 MG/0.4ML ~~LOC~~ SOLN
40.0000 mg | SUBCUTANEOUS | Status: DC
  Administered 2017-07-02 – 2017-07-03 (×2): 40 mg via SUBCUTANEOUS
  Filled 2017-07-02 (×2): qty 0.4

## 2017-07-02 MED ORDER — IBUPROFEN 200 MG PO TABS: 400.0000 mg | ORAL_TABLET | Freq: Four times a day (QID) | ORAL | Status: DC | PRN

## 2017-07-02 MED ORDER — ACETAMINOPHEN 500 MG PO TABS
1000.0000 mg | ORAL_TABLET | Freq: Once | ORAL | Status: AC
  Administered 2017-07-02: 1000 mg via ORAL
  Filled 2017-07-02: qty 2

## 2017-07-02 MED ORDER — IPRATROPIUM-ALBUTEROL 0.5-2.5 (3) MG/3ML IN SOLN
3.0000 mL | Freq: Once | RESPIRATORY_TRACT | Status: AC
  Administered 2017-07-02: 3 mL via RESPIRATORY_TRACT
  Filled 2017-07-02: qty 3

## 2017-07-02 MED ORDER — IOPAMIDOL (ISOVUE-370) INJECTION 76%
INTRAVENOUS | Status: AC
  Administered 2017-07-02: 70 mL via INTRAVENOUS
  Filled 2017-07-02: qty 100

## 2017-07-02 MED ORDER — PSEUDOEPHEDRINE HCL 30 MG PO TABS: 30.0000 mg | ORAL_TABLET | ORAL | Status: DC | PRN

## 2017-07-02 MED ORDER — LORATADINE 10 MG PO TABS
10.0000 mg | ORAL_TABLET | Freq: Every day | ORAL | Status: DC
  Administered 2017-07-03 – 2017-07-05 (×3): 10 mg via ORAL
  Filled 2017-07-02 (×4): qty 1

## 2017-07-02 MED ORDER — POTASSIUM CHLORIDE CRYS ER 20 MEQ PO TBCR
40.0000 meq | EXTENDED_RELEASE_TABLET | Freq: Once | ORAL | Status: AC
  Administered 2017-07-02: 40 meq via ORAL
  Filled 2017-07-02: qty 2

## 2017-07-02 MED ORDER — ZOLPIDEM TARTRATE 5 MG PO TABS
5.0000 mg | ORAL_TABLET | Freq: Every evening | ORAL | Status: DC | PRN
  Administered 2017-07-04: 5 mg via ORAL
  Filled 2017-07-02: qty 1

## 2017-07-02 NOTE — Progress Notes (Signed)
Subjective  CC:  Chief Complaint  Patient presents with  . Follow-up    Pnuemonia follow up, exhausted, not hungry, coughing, wants to sleep    HPI: Teresa Wells is a 57 y.o. female who presents to the office today to address the problems listed above in the chief complaint.  See last note: pt with pneumonia on avelox - was improving; now here for f/u and reports worsening breathing, low energy and not eating. Has had chills. Completed a 10 day course of avelox. Had pleuritic chest pain, DOE, and fatigue. No abdominal pain.   Labs from last week from ER (pt left w/o being seen) showed WBC 19.1 and CXR had patchy infiltrates. Pulse ox was 94% at that time; now in the 80s.  I reviewed the patients updated PMH, FH, and SocHx.    Patient Active Problem List   Diagnosis Date Noted  . Insomnia 10/03/2015  . Adenopathy 10/03/2015  . Incisional hernia, without obstruction or gangrene 09/24/2013  . Breast cyst 09/12/2011  . Meniscus tear 09/12/2011  . Allergic rhinitis due to allergen 09/12/2011  . Menopause 09/12/2011  . Menopausal hot flushes 09/12/2011  . Elevated BP 09/12/2011  . Ovarian cyst 09/12/2011  . Hyperlipidemia 09/12/2011   Current Meds  Medication Sig  . acetaminophen (TYLENOL) 500 MG tablet Take 1,000 mg by mouth every 6 (six) hours as needed for mild pain or moderate pain.   . Ascorbic Acid (VITAMIN C) 1000 MG tablet Take by mouth.  . cetirizine (ZYRTEC) 5 MG tablet Take 5 mg by mouth daily. Reported on 10/03/2015  . fluticasone (FLONASE) 50 MCG/ACT nasal spray Place 1 spray into both nostrils daily.  Marland Kitchen ibuprofen (ADVIL,MOTRIN) 200 MG tablet Take 200 mg by mouth every 6 (six) hours as needed.  . moxifloxacin (AVELOX) 400 MG tablet Take 1 tablet (400 mg total) by mouth daily at 8 pm.  . Multiple Vitamin (MULTIVITAMIN) tablet Take 1 tablet by mouth daily. Reported on 10/03/2015  . pseudoephedrine (SUDAFED) 30 MG tablet Take by mouth.  . zolpidem (AMBIEN) 5 MG tablet  zolpidem 5 mg tablet   Current Facility-Administered Medications for the 07/02/17 encounter (Office Visit) with Leamon Arnt, MD  Medication  . TDaP (BOOSTRIX) injection 0.5 mL    Allergies: Patient is allergic to oxycodone; penicillins; and sulfa antibiotics. Family History: Patient family history includes Diabetes in her father and mother; Heart disease in her brother, father, and mother; Hypertension in her brother, father, and mother; Stroke in her father. Social History:  Patient  reports that  has never smoked. she has never used smokeless tobacco. She reports that she drinks alcohol. She reports that she does not use drugs.  Review of Systems: Constitutional: positive for fever malaise or anorexia Cardiovascular: negative for chest pain Respiratory: positive for SOB or persistent cough Gastrointestinal: negative for abdominal pain  Objective  Vitals: BP 130/70   Pulse 97   Temp 99.4 F (37.4 C) (Oral)   Resp 18   Ht 5\' 2"  (1.575 m)   Wt 186 lb 12.8 oz (84.7 kg)   SpO2 (!) 79%   BMI 34.17 kg/m  General: appears toxic: increased WOB; dyspneic at rest, pale HEENT: [place], conjunctiva normal, Oropharynx moist,neck is supple Cardiovascular:  Tachy without murmur or gallop.  Respiratory:  Right rales entire lung fields, no wheeze EXT: no cords or edema Skin:  Warm, no rashes  No visits with results within 1 Day(s) from this visit.  Latest known visit with results  is:  Admission on 06/24/2017, Discharged on 06/24/2017  Component Date Value Ref Range Status  . Lactic Acid, Venous 06/24/2017 0.74  0.5 - 1.9 mmol/L Final  . Sodium 06/24/2017 138  135 - 145 mmol/L Final  . Potassium 06/24/2017 3.2* 3.5 - 5.1 mmol/L Final  . Chloride 06/24/2017 99* 101 - 111 mmol/L Final  . CO2 06/24/2017 25  22 - 32 mmol/L Final  . Glucose, Bld 06/24/2017 105* 65 - 99 mg/dL Final  . BUN 06/24/2017 7  6 - 20 mg/dL Final  . Creatinine, Ser 06/24/2017 0.82  0.44 - 1.00 mg/dL Final  .  Calcium 06/24/2017 8.6* 8.9 - 10.3 mg/dL Final  . Total Protein 06/24/2017 7.7  6.5 - 8.1 g/dL Final  . Albumin 06/24/2017 3.4* 3.5 - 5.0 g/dL Final  . AST 06/24/2017 36  15 - 41 U/L Final  . ALT 06/24/2017 55* 14 - 54 U/L Final  . Alkaline Phosphatase 06/24/2017 291* 38 - 126 U/L Final  . Total Bilirubin 06/24/2017 0.9  0.3 - 1.2 mg/dL Final  . GFR calc non Af Amer 06/24/2017 >60  >60 mL/min Final  . GFR calc Af Amer 06/24/2017 >60  >60 mL/min Final  . Anion gap 06/24/2017 14  5 - 15 Final  . WBC 06/24/2017 16.9* 4.0 - 10.5 K/uL Final  . RBC 06/24/2017 4.34  3.87 - 5.11 MIL/uL Final  . Hemoglobin 06/24/2017 13.3  12.0 - 15.0 g/dL Final  . HCT 06/24/2017 39.7  36.0 - 46.0 % Final  . MCV 06/24/2017 91.5  78.0 - 100.0 fL Final  . MCH 06/24/2017 30.6  26.0 - 34.0 pg Final  . MCHC 06/24/2017 33.5  30.0 - 36.0 g/dL Final  . RDW 06/24/2017 13.4  11.5 - 15.5 % Final  . Platelets 06/24/2017 408* 150 - 400 K/uL Final  . Neutrophils Relative % 06/24/2017 77  % Final  . Neutro Abs 06/24/2017 13.0* 1.7 - 7.7 K/uL Final  . Lymphocytes Relative 06/24/2017 14  % Final  . Lymphs Abs 06/24/2017 2.3  0.7 - 4.0 K/uL Final  . Monocytes Relative 06/24/2017 8  % Final  . Monocytes Absolute 06/24/2017 1.4* 0.1 - 1.0 K/uL Final  . Eosinophils Relative 06/24/2017 1  % Final  . Eosinophils Absolute 06/24/2017 0.2  0.0 - 0.7 K/uL Final  . Basophils Relative 06/24/2017 0  % Final  . Basophils Absolute 06/24/2017 0.0  0.0 - 0.1 K/uL Final  . Color, Urine 06/24/2017 YELLOW  YELLOW Final  . APPearance 06/24/2017 CLEAR  CLEAR Final  . Specific Gravity, Urine 06/24/2017 1.003* 1.005 - 1.030 Final  . pH 06/24/2017 6.0  5.0 - 8.0 Final  . Glucose, UA 06/24/2017 NEGATIVE  NEGATIVE mg/dL Final  . Hgb urine dipstick 06/24/2017 SMALL* NEGATIVE Final  . Bilirubin Urine 06/24/2017 NEGATIVE  NEGATIVE Final  . Ketones, ur 06/24/2017 NEGATIVE  NEGATIVE mg/dL Final  . Protein, ur 06/24/2017 NEGATIVE  NEGATIVE mg/dL Final    . Nitrite 06/24/2017 NEGATIVE  NEGATIVE Final  . Leukocytes, UA 06/24/2017 NEGATIVE  NEGATIVE Final  . RBC / HPF 06/24/2017 0-5  0 - 5 RBC/hpf Final  . WBC, UA 06/24/2017 0-5  0 - 5 WBC/hpf Final  . Bacteria, UA 06/24/2017 RARE* NONE SEEN Final  . Squamous Epithelial / LPF 06/24/2017 6-30* NONE SEEN Final    Assessment  1. Community acquired pneumonia, unspecified laterality   2. Hypoxia   3. Dehydration      Plan   Hypoxic and pneumonia:  Improved  oxygenation with 2L O2 [place] to 94%. Ambulance to transport to [facility] for eval/admission. Worsening sxs in spite of Avelox. Needs further investigation. See last note for history prior to today.   Follow up: pending eval.     Commons side effects, risks, benefits, and alternatives for medications and treatment plan prescribed today were discussed, and the patient expressed understanding of the given instructions. Patient is instructed to call or message via MyChart if he/she has any questions or concerns regarding our treatment plan. No barriers to understanding were identified. We discussed Red Flag symptoms and signs in detail. Patient expressed understanding regarding what to do in case of urgent or emergency type symptoms.   Medication list was reconciled, printed and provided to the patient in AVS. Patient instructions and summary information was reviewed with the patient as documented in the AVS. This note was prepared with assistance of Dragon voice recognition software. Occasional wrong-word or sound-a-like substitutions may have occurred due to the inherent limitations of voice recognition software  No orders of the defined types were placed in this encounter.  No orders of the defined types were placed in this encounter.

## 2017-07-02 NOTE — ED Notes (Signed)
Pt placed on HF [place] per RT

## 2017-07-02 NOTE — Progress Notes (Signed)
Pt placed on 6L HFNC at this time due to low spo2 (94%). ED MD aware. Pt in no distress, no increased WOB, VS within normal limits.

## 2017-07-02 NOTE — ED Notes (Signed)
Hospitalist at bedside for evaluation

## 2017-07-02 NOTE — Consult Note (Signed)
PULMONARY / CRITICAL CARE MEDICINE   Name: Teresa Wells MRN: [id number] DOB: 1961-01-30    ADMISSION DATE:  07/02/2017 CONSULTATION DATE:  3/5  REFERRING MD:  Dr. Lorin Mercy  CHIEF COMPLAINT:  Hypoxia, diffuse infiltrates.   HISTORY OF PRESENT ILLNESS:   57 year old female with PMH as below with no significant past medical history presented to [facility] ED 3/5 with complaints of dyspnea and malaise for several months. She reports malaise dating all the way back to early December. She was very busy at work and attributed it to this. She also complained that walking moderate distances would have her winded. These symptoms slowly progressed. Toward the end of December she underwent the first step of a two step dental implant procedure. She was prescribed a Z-pack for peri-procedure prophylaxis.  She return for step 2 in early February.  The following day she flew to [place] and back and reports symptoms significantly worsening in the following days.  The following 3-4 weeks or significant for profound dyspnea and malaise.  She presented to her primary care provider with these complaints.  Chest x-ray concerning for pneumonia and she was started on a course of Avelox.  She returned to her PCP for follow-up after completing antibiotics and was as dyspneic as she was during her initial visit so her PCP sent her to the emergency department.  Upon arrival to the ED she was hypoxemic requiring 6 L of high flow nasal cannula oxygen.  CT scan of the chest demonstrated diffuse pulmonary infiltrates somewhat sparing the bases concerning for hypersensitivity pneumonitis.  Pulmonary has been asked to evaluate.  Currently she reports breathing comfortably on 6 L.  She denies any new recent exposures at home or in the workplace, denies knowledge of any tick bite, no pet birds, no medication changes with the exception of the recent course of Avelox.  She does have an implanted estrogen pump.  She denies muscle or joint  aches.  No fevers or chills.  No strong family history of autoimmune disease.  Also denies swelling of her lower extremities, orthopnea, PND.  She does complain of recent night sweats and unintentional weight gain.  She has not been tried on steroids.  PAST MEDICAL HISTORY :  She  has a past medical history of Abnormal Pap smear, Acute meniscal tear of knee (LEFT), Breast tumor (2009), Insomnia, Menopause, and Sinus infection (6 months ago).  PAST SURGICAL HISTORY: She  has a past surgical history that includes Bilateral salpingoophorectomy (2009); Hysteroscopy with resectoscope (2006); Knee arthroscopy (05/04/2011); Meniscus debridement (05/04/2011); Breast fibroadenoma surgery (2001); Incisional hernia repair (N/A, 11/12/2013); Insertion of mesh (N/A, 11/12/2013); Mole removal (Left, 11/12/2013); Open reduction internal fixation (orif) distal radial fracture (Left, 08/27/2014); and Carpal tunnel release (Left, 08/27/2014).  Allergies  Allergen Reactions  . Oxycodone   . Penicillins Hives  . Sulfa Antibiotics Hives    Current Facility-Administered Medications on File Prior to Encounter  Medication  . TDaP (BOOSTRIX) injection 0.5 mL   Current Outpatient Medications on File Prior to Encounter  Medication Sig  . acetaminophen (TYLENOL) 500 MG tablet Take 1,000 mg by mouth every 6 (six) hours as needed for mild pain or moderate pain.   . Ascorbic Acid (VITAMIN C) 1000 MG tablet Take by mouth.  . cetirizine (ZYRTEC) 10 MG tablet Take 10 mg by mouth daily. Reported on 10/03/2015  . fluticasone (FLONASE) 50 MCG/ACT nasal spray Place 1 spray into both nostrils as needed for allergies.   Marland Kitchen  ibuprofen (ADVIL,MOTRIN) 200 MG tablet Take 200 mg by mouth every 6 (six) hours as needed.  . Multiple Vitamin (MULTIVITAMIN) tablet Take 1 tablet by mouth daily. Reported on 10/03/2015  . pseudoephedrine (SUDAFED) 30 MG tablet Take 30 mg by mouth as needed for congestion.   Marland Kitchen zolpidem (AMBIEN) 5 MG tablet zolpidem 5 mg  tablet  . guaiFENesin-codeine 100-10 MG/5ML syrup Take 5 mLs by mouth every 6 (six) hours as needed for cough. (Patient not taking: Reported on 07/02/2017)  . moxifloxacin (AVELOX) 400 MG tablet Take 1 tablet (400 mg total) by mouth daily at 8 pm. (Patient not taking: Reported on 07/02/2017)    FAMILY HISTORY:  Her indicated that her mother is alive. She indicated that her father is deceased. She indicated that her brother is alive.   SOCIAL HISTORY: She  reports that  has never smoked. she has never used smokeless tobacco. She reports that she drinks alcohol. She reports that she does not use drugs.  REVIEW OF SYSTEMS:   See HPI  SUBJECTIVE:  "feel like a bulldozer has pushed me off a cliff"  VITAL SIGNS: BP (!) 109/59   Pulse 83   Temp 98.3 F (36.8 C) (Oral)   Resp 20   Ht '5\' 2"'$  (1.575 m)   Wt 82.6 kg (182 lb)   SpO2 96%   BMI 33.29 kg/m   HEMODYNAMICS:    VENTILATOR SETTINGS:    INTAKE / OUTPUT: No intake/output data recorded.  PHYSICAL EXAMINATION: General:  Middle aged female of normal body habitus in NAD Neuro:  Alert, oriented, non-focal HEENT:  [place]/AT, PERRL, no JVD. OP clear, no abscess. Oral thrush.  Cardiovascular:  RRR, no MRG Lungs:  Coarse crackles throughout posterior lung fields. Tachypnea rate 38 on 6L Abdomen:  Soft, non-tender, non-distended Musculoskeletal:  No acute deformity or ROM limitation Skin:  Grossly intact  LABS:  BMET Recent Labs  Lab 07/02/17 1028  NA 133*  K 2.7*  CL 91*  CO2 29  BUN 6  CREATININE 0.90  GLUCOSE 111*    Electrolytes Recent Labs  Lab 07/02/17 1028  CALCIUM 7.9*    CBC Recent Labs  Lab 07/02/17 1028  WBC 21.7*  HGB 12.6  HCT 37.7  PLT 468*    Coag's No results for input(s): APTT, INR in the last 168 hours.  Sepsis Markers Recent Labs  Lab 07/02/17 1053  LATICACIDVEN 1.53    ABG No results for input(s): PHART, PCO2ART, PO2ART in the last 168 hours.  Liver Enzymes No results for  input(s): AST, ALT, ALKPHOS, BILITOT, ALBUMIN in the last 168 hours.  Cardiac Enzymes No results for input(s): TROPONINI, PROBNP in the last 168 hours.  Glucose No results for input(s): GLUCAP in the last 168 hours.  Imaging Ct Angio Chest Pe W/cm &/or Wo Cm  Result Date: 07/02/2017 CLINICAL DATA:  Pneumonia diagnosed 2 weeks ago. Patient states that she is feeling worse instead of getting better. Persistent low-grade fever. EXAM: CT ANGIOGRAPHY CHEST WITH CONTRAST TECHNIQUE: Multidetector CT imaging of the chest was performed using the standard protocol during bolus administration of intravenous contrast. Multiplanar CT image reconstructions and MIPs were obtained to evaluate the vascular anatomy. CONTRAST:  70 cc ISOVUE-370 IOPAMIDOL (ISOVUE-370) INJECTION 76% COMPARISON:  06/24/2017 and 07/02/2017 CXR FINDINGS: Cardiovascular: Heart is top-normal in size. 1 no pericardial effusion. Minimal aortic atherosclerosis at the arch. No aneurysm or dissection. No acute pulmonary embolus. Normal branch pattern of the great vessels. Mediastinum/Nodes: Mildly enlarged prevascular and paratracheal  lymph nodes. Bilateral hilar lymphadenopathy likely reactive, the largest is on the right measuring up to 16 mm short axis. Normal appearance of the thyroid gland without mass. Patent midline trachea and mainstem bronchi. Unremarkable CT appearance of the esophagus. Lungs/Pleura: Diffuse bilateral airspace opacities are seen throughout all lobes of both lungs. No effusion or pneumothorax is identified. No dominant mass is visualized. Given what appear to be areas of air bronchograms especially in the upper lobes, favor a postinfectious etiology as opposed to pulmonary edema or hemorrhage. Upper Abdomen: No acute abnormality. Musculoskeletal: No chest wall abnormality. No acute or significant osseous findings. Review of the MIP images confirms the above findings. IMPRESSION: 1. Diffuse bilateral multilobar airspace  opacities are identified, some areas demonstrating air bronchograms. Findings favor a postinfectious etiology/pneumonia, less likely stigmata of CHF/pulmonary edema or pulmonary hemorrhage. 2. Enlarged bilateral hilar and mildly enlarged mediastinal lymph nodes likely reactive in the setting of bilateral airspace disease. 3. No acute pulmonary embolus. 4. Minimal aortic atherosclerosis. Aortic Atherosclerosis (ICD10-I70.0). Electronically Signed   By: Ashley Royalty M.D.   On: 07/02/2017 14:11   Dg Chest Portable 1 View  Result Date: 07/02/2017 CLINICAL DATA:  57 year old female with shortness of breath for 5 days and productive cough. Diagnosed with pneumonia yesterday. EXAM: PORTABLE CHEST 1 VIEW COMPARISON:  06/24/2017 and earlier. FINDINGS: Portable AP upright view at 1042 hours. Chronic bilateral interstitial and reticulonodular pulmonary opacity. Superimposed new patchy right lung base opacity. No air bronchograms. No definite acute opacity in the left lung. No pleural effusion or pneumothorax. Mediastinal contours remain normal. Visualized tracheal air column is within normal limits. No acute osseous abnormality identified. Negative visible bowel gas pattern. IMPRESSION: 1. Patchy right lower lung opacity superimposed on chronic interstitial lung disease is nonspecific but compatible with acute bronchopneumonia/pneumonia in this clinical setting. No pleural effusion is evident. 2. If pneumonia is suspected then follow-up PA and lateral chest X-ray is recommended in 3-4 weeks following trial of antibiotic therapy to ensure resolution and exclude underlying malignancy. Electronically Signed   By: Genevie Ann M.D.   On: 07/02/2017 11:00     STUDIES:  CTA chest 3/5 > 1. Diffuse bilateral multilobar airspace opacities are identified, some areas demonstrating air bronchograms. Findings favor a postinfectious etiology/pneumonia, less likely stigmata of CHF/pulmonary edema or pulmonary hemorrhage. Enlarged bilateral  hilar and mildly enlarged mediastinal lymph nodes likely reactive in the setting of bilateral airspace disease.  CULTURES:   ANTIBIOTICS:   SIGNIFICANT EVENTS: 3/5 admit >  LINES/TUBES:   ASSESSMENT / PLAN:  Acute hypoxemic respiratory failure: with diffuse pulmonary infiltrates. The differential here is very broad. Based on relative sparing of the bases, this could represent a hypersensitivity pneumonitis. 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 Generally based on her history it seems most likely the etiology is inflammatory in nature. With recent viral vs bacterial PNA s/p treatment infectious etiology cannot be ruled out, or a post infectious acute lung injury. At first I was under the impression this started with dental work and was concerned she could be bacteremic with ALI secondary to that, however, it seems her symptoms predated her dental procedure. Ideally we would like to have the diagnostic benefit of a bronchoscopy/biopsy, however, with her current degree of hypoxemia there is concern she would end up requiring mechanical ventilation, and would be difficult to liberate. Plan - Supplemental O2 to maintain SpO2 > 92% (currently on 6L HFNC) - Will start IV steroids in  the AM to ensure all blood work has been collected. Hopefully she will respond to this.  - Serologic workup to include ESR, CRP, Rheumatoid factor, HIV screen, ANCA, Hypersensitivity pneumonitis panel, ENA, CCP, CK, ANA, and aldolase.  - Respiratory viral panel - Procalcitonin - TSH - Reasonable to hold off on antibiotics for now - Long term she will likely need lung biopsy  Georgann Housekeeper, AGACNP-BC [facility] Pulmonology/Critical Care Pager [phone] or [phone]  07/02/2017 4:59 PM

## 2017-07-02 NOTE — H&P (Signed)
History and Physical    Teresa Wells ALP:[id number] DOB: [birth year] DOA: 07/02/2017  PCP: Leamon Arnt, MD Consultants:  [facility] - GYN Patient coming from:  Home - lives with husband; NOK: Husband, [phone]  Chief Complaint: persistent respiratory failure  HPI: Teresa Wells is a 57 y.o. female with no significant past medical history presenting with persistent respiratory failure despite recent antibiotics.  She started having dental surgery done before Christmas.  She went back to the dentist on 2/6 and they placed dental implants and she took 5 days of Z-pack.  She flew to [place] the following morning and returned that evening and "from then on I have been sick."  She returned to the dentist the next week to have the stitches removed and he wanted to give her another rx but she didn't get it filled because it was too expensive.  She got worse and went to get the rx (Avelox) filled after all.  She had taken 5 days of it when she went to the walk-in clinic in [place] on 2/25; they suggested that if she worsened she needed to come to the ER.  She came to the ER on 2/25 and waited but left without being seen.  She completed the Avelox and followed up this AM.   She has never missed work before but has been out for the last 2 weeks - "something's really wrong."  She has not felt like herself for several weeks - maybe since the first of January "like something's not the norm with me but I couldn't put my finger on it."  +Cough - productive now of yellow-brown sputum.  +SOB all the time, but it has worsened in the last 4-5 days.  +wheezing.  Generalized body aches, chest tightness.  She lacks energy.  Decreased appetite the last week or so, thinks she has lost about 4 pounds since last ER visit.  She was clammy and shivering in the ER, improved with 1 gram of Tylenol.  She does wake up with night sweats.  Her mother "got this and died from it" - complications of pneumonia.  She is having lots of life  stressors right now.   ED Course:  Dental procedure in Jan -> Z-pack.  Then dental problems, given Avelox for dental infection.  Saw PCP on 2/26, completed 5 days of Avelox and shared with her husband.  She was given 5 more days of Avelox, now completed course.  Followed up today, 80% on RA.  Productive cough, chills, fever.  CXR still with PNA.  CT with resolving PNA.  On 5L O2, given 2 nebs, high flow O2, now on 3L and feeling better.  "Sounds awful".  Unsure whether she still needs antibiotics.  Review of Systems: As per HPI; otherwise review of systems reviewed and negative.   Ambulatory Status:  Ambulates without assistance  Past Medical History:  Diagnosis Date  . Abnormal Pap smear   . Acute meniscal tear of knee LEFT  . Breast tumor 2009   benign  . Insomnia   . Menopause   . Sinus infection 6 months ago    Past Surgical History:  Procedure Laterality Date  . BILATERAL SALPINGOOPHORECTOMY  2009   LAPAROSCOPIC-- LEFT ADNEXAL MASS  . BREAST FIBROADENOMA SURGERY  2001   LEFT  . CARPAL TUNNEL RELEASE Left 08/27/2014   Procedure: LEFT CARPAL TUNNEL RELEASE;  Surgeon: Charlotte Crumb, MD;  Location: [facility];  Service: Orthopedics;  Laterality: Left;  .  HYSTEROSCOPY WITH RESECTOSCOPE  2006   MYOMECTOMY  . INCISIONAL HERNIA REPAIR N/A 11/12/2013   Procedure: HERNIA REPAIR INCISIONAL;  Surgeon: Odis Hollingshead, MD;  Location: [facility];  Service: General;  Laterality: N/A;  . INSERTION OF MESH N/A 11/12/2013   Procedure: INSERTION OF MESH;  Surgeon: Odis Hollingshead, MD;  Location: [facility];  Service: General;  Laterality: N/A;  . KNEE ARTHROSCOPY  05/04/2011   Procedure: ARTHROSCOPY KNEE;  Surgeon: Johnn Hai;  Location: [facility];  Service: Orthopedics;  Laterality: Left;  . MENISCUS DEBRIDEMENT  05/04/2011   Procedure: DEBRIDEMENT OF MENISCUS;  Surgeon: Johnn Hai;  Location: [facility];  Service: Orthopedics;  Laterality:  Left;  . MOLE REMOVAL Left 11/12/2013   Procedure: MOLE REMOVAL LEFT THIGH;  Surgeon: Odis Hollingshead, MD;  Location: [facility];  Service: General;  Laterality: Left;  . OPEN REDUCTION INTERNAL FIXATION (ORIF) DISTAL RADIAL FRACTURE Left 08/27/2014   Procedure: OPEN REDUCTION INTERNAL FIXATION (ORIF) LEFT DISTAL RADIAL FRACTURE;  Surgeon: Charlotte Crumb, MD;  Location: [facility];  Service: Orthopedics;  Laterality: Left;    Social History   Socioeconomic History  . Marital status: Divorced    Spouse name: Not on file  . Number of children: Not on file  . Years of education: Not on file  . Highest education level: Not on file  Social Needs  . Financial resource strain: Not on file  . Food insecurity - worry: Not on file  . Food insecurity - inability: Not on file  . Transportation needs - medical: Not on file  . Transportation needs - non-medical: Not on file  Occupational History  . Not on file  Tobacco Use  . Smoking status: Never Smoker  . Smokeless tobacco: Never Used  Substance and Sexual Activity  . Alcohol use: Yes    Comment: rare beer  . Drug use: No  . Sexual activity: Yes    Birth control/protection: Surgical  Other Topics Concern  . Not on file  Social History Narrative  . Not on file    Allergies  Allergen Reactions  . Oxycodone   . Penicillins Hives  . Sulfa Antibiotics Hives    Family History  Problem Relation Age of Onset  . Diabetes Mother   . Hypertension Mother   . Heart disease Mother   . Diabetes Father   . Hypertension Father   . Stroke Father   . Heart disease Father   . Heart disease Brother   . Hypertension Brother     Prior to Admission medications   Medication Sig Start Date End Date Taking? Authorizing Provider  acetaminophen (TYLENOL) 500 MG tablet Take 1,000 mg by mouth every 6 (six) hours as needed for mild pain or moderate pain.    Yes [provider]  Ascorbic Acid (VITAMIN C) 1000 MG tablet Take by  mouth.   Yes [provider]  cetirizine (ZYRTEC) 10 MG tablet Take 10 mg by mouth daily. Reported on 10/03/2015   Yes [provider]  fluticasone (FLONASE) 50 MCG/ACT nasal spray Place 1 spray into both nostrils as needed for allergies.    Yes [provider]  ibuprofen (ADVIL,MOTRIN) 200 MG tablet Take 200 mg by mouth every 6 (six) hours as needed.   Yes [provider]  Multiple Vitamin (MULTIVITAMIN) tablet Take 1 tablet by mouth daily. Reported on 10/03/2015   Yes [provider]  pseudoephedrine (SUDAFED) 30 MG tablet Take  30 mg by mouth as needed for congestion.    Yes [provider]  zolpidem (AMBIEN) 5 MG tablet zolpidem 5 mg tablet   Yes [provider]  guaiFENesin-codeine 100-10 MG/5ML syrup Take 5 mLs by mouth every 6 (six) hours as needed for cough. Patient not taking: Reported on 07/02/2017 06/25/17   Leamon Arnt, MD  moxifloxacin (AVELOX) 400 MG tablet Take 1 tablet (400 mg total) by mouth daily at 8 pm. Patient not taking: Reported on 07/02/2017 06/25/17   Leamon Arnt, MD    Physical Exam: Vitals:   07/02/17 1530 07/02/17 1545 07/02/17 1600 07/02/17 1615  BP: 116/69 (!) 104/54 135/73 118/78  Pulse: 80 80 83 79  Resp: (!) 23 16 (!) 30 (!) 25  Temp:      TempSrc:      SpO2: 92% 96% 98% 94%  Weight:      Height:         General:  Appears calm and comfortable and is NAD; low 90s on 6L [place] O2 Eyes:  PERRL, EOMI, normal lids, iris ENT:  grossly normal hearing, lips & tongue, mmm; partial denture was removed and mouth explored - no obvious source of decay that could be contributing to recurrent PNA.  She does have diffuse thrush primarily along her posterior oropharynx. Neck:  no LAD, masses or thyromegaly; no carotid bruits Cardiovascular:  RRR, no m/r/g. No LE edema.  Respiratory:  Diffusely rhonchorous bilaterally with reasonable air movement.  No obvious increased WOB on 6L [place] O2 despite apparent  hypoxia. Abdomen:  soft, NT, ND, NABS Back:   normal alignment, no CVAT Skin:  no rash or induration seen on limited exam Musculoskeletal:  grossly normal tone BUE/BLE, good ROM, no bony abnormality Psychiatric:  grossly normal mood and affect, speech fluent and appropriate, AOx3 Neurologic:  CN 2-12 grossly intact, moves all extremities in coordinated fashion, sensation intact    Radiological Exams on Admission: Ct Angio Chest Pe W/cm &/or Wo Cm  Result Date: 07/02/2017 CLINICAL DATA:  Pneumonia diagnosed 2 weeks ago. Patient states that she is feeling worse instead of getting better. Persistent low-grade fever. EXAM: CT ANGIOGRAPHY CHEST WITH CONTRAST TECHNIQUE: Multidetector CT imaging of the chest was performed using the standard protocol during bolus administration of intravenous contrast. Multiplanar CT image reconstructions and MIPs were obtained to evaluate the vascular anatomy. CONTRAST:  70 cc ISOVUE-370 IOPAMIDOL (ISOVUE-370) INJECTION 76% COMPARISON:  06/24/2017 and 07/02/2017 CXR FINDINGS: Cardiovascular: Heart is top-normal in size. 1 no pericardial effusion. Minimal aortic atherosclerosis at the arch. No aneurysm or dissection. No acute pulmonary embolus. Normal branch pattern of the great vessels. Mediastinum/Nodes: Mildly enlarged prevascular and paratracheal lymph nodes. Bilateral hilar lymphadenopathy likely reactive, the largest is on the right measuring up to 16 mm short axis. Normal appearance of the thyroid gland without mass. Patent midline trachea and mainstem bronchi. Unremarkable CT appearance of the esophagus. Lungs/Pleura: Diffuse bilateral airspace opacities are seen throughout all lobes of both lungs. No effusion or pneumothorax is identified. No dominant mass is visualized. Given what appear to be areas of air bronchograms especially in the upper lobes, favor a postinfectious etiology as opposed to pulmonary edema or hemorrhage. Upper Abdomen: No acute abnormality.  Musculoskeletal: No chest wall abnormality. No acute or significant osseous findings. Review of the MIP images confirms the above findings. IMPRESSION: 1. Diffuse bilateral multilobar airspace opacities are identified, some areas demonstrating air bronchograms. Findings favor a postinfectious etiology/pneumonia, less likely stigmata of CHF/pulmonary edema  or pulmonary hemorrhage. 2. Enlarged bilateral hilar and mildly enlarged mediastinal lymph nodes likely reactive in the setting of bilateral airspace disease. 3. No acute pulmonary embolus. 4. Minimal aortic atherosclerosis. Aortic Atherosclerosis (ICD10-I70.0). Electronically Signed   By: Ashley Royalty M.D.   On: 07/02/2017 14:11   Dg Chest Portable 1 View  Result Date: 07/02/2017 CLINICAL DATA:  57 year old female with shortness of breath for 5 days and productive cough. Diagnosed with pneumonia yesterday. EXAM: PORTABLE CHEST 1 VIEW COMPARISON:  06/24/2017 and earlier. FINDINGS: Portable AP upright view at 1042 hours. Chronic bilateral interstitial and reticulonodular pulmonary opacity. Superimposed new patchy right lung base opacity. No air bronchograms. No definite acute opacity in the left lung. No pleural effusion or pneumothorax. Mediastinal contours remain normal. Visualized tracheal air column is within normal limits. No acute osseous abnormality identified. Negative visible bowel gas pattern. IMPRESSION: 1. Patchy right lower lung opacity superimposed on chronic interstitial lung disease is nonspecific but compatible with acute bronchopneumonia/pneumonia in this clinical setting. No pleural effusion is evident. 2. If pneumonia is suspected then follow-up PA and lateral chest X-ray is recommended in 3-4 weeks following trial of antibiotic therapy to ensure resolution and exclude underlying malignancy. Electronically Signed   By: Genevie Ann M.D.   On: 07/02/2017 11:00    EKG: Independently reviewed.  NSR with rate 91; LVH; nonspecific ST changes with no  evidence of acute ischemia   Labs on Admission: I have personally reviewed the available labs and imaging studies at the time of the admission.  Pertinent labs:   VBG: 7.417/51.7/31.0/33.3 Lactate 1.53 Troponin 0.04 Na++ 133 K+ 2.7 Glucose 111 WBC 21.7; 16.9 on 2/25 Plt 468; 408 on 2/25 AP 291 on 2/25 AST 36/ALT 55 on 2/25  Assessment/Plan Principal Problem:   Acute hypoxemic respiratory failure (HCC) Active Problems:   Adenopathy   Hypokalemia   Elevated LFTs   Acute hypoxemic respiratory failure -Patient with prior CXR from 2/25 with possible diffuse bronchitis and likely bilateral PNA - possibly viral in nature -At that time, she had already been treated with Azithromycin -She was then treated with a total of 10 days of Avelox and has had progressive symptoms; progressive hypoxia; and ongoing elevation in WBC count. -CXR today again appeared to show RLL PNA but also possible chronic ILD - which would be new for this patient -Chest CT was obtained and showed bilateral ground glass changes with alveolar edema. -Given her dental history, I evaluated her mouth and do not see evidence of current infection that is likely contributing to persistent PNA; additionally, she has been treated with Avelox recently and it has reasonably good anaerobic coverage. -She does have oral thrush, likely related to recent antibiotics.  She does not have known immunocompromise but a fungal PNA is a consideration.  For now will treat with oral nystatin but could consider IV antifungal therapy. -PCCM was consulted and has ordered a battery of tests, primarily looking for a hypersensitivity or alternate immune process as the cause.  She may require pulmonary biopsy. -Additional testing has been ordered including procalcitonin; HIV (no significant risk factors but UDS was also ordered accordingly); Legionella; strep pneumo; and a respiratory virus panel. -Will order UA to assess for a concomitant renal  process, although given normal renal function this seems less likely. -Pulm appears inclined to start steroids tomorrow. -For now, will support with supplemental O2 to maintain sats >92% (currently on 6L with sats teetering here). -An ABG would likely be more helpful than a  VBG in this scenario and so could be considered if oxygenation remains problematic.  Adenopathy -This is likely reactive in nature, but given her progressive symptoms in conjunction with elevating WBC count, night sweats, weight loss, and LAD - biopsy could be considered to assess for lymphoma. -Would suggest consideration of this option if the patient is not improving despite currently planned therapies.  Elevated LFTs -Primarily elevated AP -This may be evidence of viral etiology -Will follow -If worsening, consider CT A/P  Hypokalemia -K+ 2.7 -She was given 40 mEq PO KCl and 10 mEq IV KCl in the ER -This would be expected to increase her K+ to 3.2 -Will check Mag -Recheck K+ in AM   DVT prophylaxis: Lovenox  Code Status:  Full - confirmed with patient/family Family Communication: Friend present throughout evaluation Disposition Plan:  Home once clinically improved Consults called: Pulmonology  Admission status: Admit - It is my clinical opinion that admission to INPATIENT is reasonable and necessary because of the expectation that this patient will require hospital care that crosses at least 2 midnights to treat this condition based on the medical complexity of the problems presented.  Given the aforementioned information, the predictability of an adverse outcome is felt to be significant.    Karmen Bongo MD [facility]  If note is complete, please contact covering daytime or nighttime physician. [email or web address] Password [facility]  07/02/2017, 5:43 PM

## 2017-07-02 NOTE — ED Notes (Signed)
Pt returned to room from CT

## 2017-07-02 NOTE — ED Notes (Signed)
Patient transported to CT 

## 2017-07-02 NOTE — Progress Notes (Signed)
Pt requested for cough medication as she was observed to be coughing, also received a call from Lab that they wanted her HIV test ordered  be changed to the 4th generation type of test that is required for in patient, NP Forrest Moron (on call) paged and notified, awaiting a call back, same related back to the pt, will however continue to monitor. [place]-Asidi, Yanuel Tagg Efe

## 2017-07-02 NOTE — ED Triage Notes (Signed)
Pt has been recently diagnosed with pneumonia last monday.  Pt reports finishing a round of Avelox 2 days ago after a 10 day treatment.  Pt went to urgent care today for f/u apt with continued cough/congestion/fever.  Urgent care found that pt's O2 saturation was in the 80's on RA.  Pt is at 94% on 6lpm upon arrival to ED, 80% on RA.  Pt reports treating her fever with tylenol/motirin, last dose yesterday

## 2017-07-02 NOTE — ED Notes (Signed)
Gave pt Diet Coke, per Caryl Pina - RN.

## 2017-07-02 NOTE — ED Notes (Signed)
Radiology at bedside for CXR

## 2017-07-02 NOTE — ED Provider Notes (Signed)
[facility] EMERGENCY DEPARTMENT Provider Note   CSN: [id number] Arrival date & time: 07/02/17  1017     History   Chief Complaint Chief Complaint  Patient presents with  . Shortness of Breath    Teresa Wells is a 57 y.o. female.  Teresa 57 year old Caucasian female with no pertinent past medical history that presents to the emergency department today for ongoing pneumonia and hypoxia.  Patient reports that the beginning of February she started having productive cough, some shortness of breath, wheezing, fevers and chills.  She was started on a Z-Pak due to a dental infection.  The patient states that she did have her dental procedure.  States that after that she started to develop sinus symptoms.  She was given Avelox by her primary care.  She took this for 10 days.  Patient states that she was seen on 2/26 and her primary care office for same symptoms.  At that time she was complaining of productive cough with pleuritic chest pain and body aches.  She also had sweats and chills.  Reading their notes patient completed a 5-day course of Avelox at that time but ran out of medication that she was sharing with her husband.  Primary care did refill her Avelox for 5 more days.  She had follow-up in the office today.  Patient reports that while she was in the office her oxygen saturations were 80% and she was sent to the ED for evaluation.  Patient reports shortness of breath, wheezing, chest tightness, productive cough, chills.  Patient denies any chest pain, lightheadedness, dizziness.  Denies any associated abdominal pain, urinary symptoms, change in bowel habits, vomiting.  Pt denies any fever,, ha, vision changes, lightheadedness, dizziness, congestion, neck pain, cp, bd pain, n/v/d, urinary symptoms, change in bowel habits, melena, hematochezia, lower extremity paresthesias.  Past Medical History:  Diagnosis Date  . Abnormal Pap smear   . Acute meniscal tear of knee  LEFT  . Breast tumor 2009   benign  . Insomnia   . Menopause   . Sinus infection 6 months ago    Patient Active Problem List   Diagnosis Date Noted  . Insomnia 10/03/2015  . Adenopathy 10/03/2015  . Incisional hernia, without obstruction or gangrene 09/24/2013  . Breast cyst 09/12/2011  . Meniscus tear 09/12/2011  . Allergic rhinitis due to allergen 09/12/2011  . Menopause 09/12/2011  . Menopausal hot flushes 09/12/2011  . Elevated BP 09/12/2011  . Ovarian cyst 09/12/2011  . Hyperlipidemia 09/12/2011    Past Surgical History:  Procedure Laterality Date  . BILATERAL SALPINGOOPHORECTOMY  2009   LAPAROSCOPIC-- LEFT ADNEXAL MASS  . BREAST FIBROADENOMA SURGERY  2001   LEFT  . CARPAL TUNNEL RELEASE Left 08/27/2014   Procedure: LEFT CARPAL TUNNEL RELEASE;  Surgeon: Charlotte Crumb, MD;  Location: [facility];  Service: Orthopedics;  Laterality: Left;  . HYSTEROSCOPY WITH RESECTOSCOPE  2006   MYOMECTOMY  . INCISIONAL HERNIA REPAIR N/A 11/12/2013   Procedure: HERNIA REPAIR INCISIONAL;  Surgeon: Odis Hollingshead, MD;  Location: [facility];  Service: General;  Laterality: N/A;  . INSERTION OF MESH N/A 11/12/2013   Procedure: INSERTION OF MESH;  Surgeon: Odis Hollingshead, MD;  Location: [facility];  Service: General;  Laterality: N/A;  . KNEE ARTHROSCOPY  05/04/2011   Procedure: ARTHROSCOPY KNEE;  Surgeon: Johnn Hai;  Location: [facility];  Service: Orthopedics;  Laterality: Left;  . MENISCUS DEBRIDEMENT  05/04/2011   Procedure:  DEBRIDEMENT OF MENISCUS;  Surgeon: Johnn Hai;  Location: [facility] [place];  Service: Orthopedics;  Laterality: Left;  . MOLE REMOVAL Left 11/12/2013   Procedure: MOLE REMOVAL LEFT THIGH;  Surgeon: Odis Hollingshead, MD;  Location: [facility];  Service: General;  Laterality: Left;  . OPEN REDUCTION INTERNAL FIXATION (ORIF) DISTAL RADIAL FRACTURE Left 08/27/2014   Procedure: OPEN REDUCTION INTERNAL FIXATION (ORIF) LEFT DISTAL  RADIAL FRACTURE;  Surgeon: Charlotte Crumb, MD;  Location: [place];  Service: Orthopedics;  Laterality: Left;    OB History    Gravida Para Term Preterm AB Living   1       1     SAB TAB Ectopic Multiple Live Births     1             Home Medications    Prior to Admission medications   Medication Sig Start Date End Date Taking? Authorizing Provider  acetaminophen (TYLENOL) 500 MG tablet Take 1,000 mg by mouth every 6 (six) hours as needed for mild pain or moderate pain.    Yes [provider]  Ascorbic Acid (VITAMIN C) 1000 MG tablet Take by mouth.   Yes [provider]  cetirizine (ZYRTEC) 10 MG tablet Take 10 mg by mouth daily. Reported on 10/03/2015   Yes [provider]  fluticasone (FLONASE) 50 MCG/ACT nasal spray Place 1 spray into both nostrils as needed for allergies.    Yes [provider]  ibuprofen (ADVIL,MOTRIN) 200 MG tablet Take 200 mg by mouth every 6 (six) hours as needed.   Yes [provider]  Multiple Vitamin (MULTIVITAMIN) tablet Take 1 tablet by mouth daily. Reported on 10/03/2015   Yes [provider]  pseudoephedrine (SUDAFED) 30 MG tablet Take 30 mg by mouth as needed for congestion.    Yes [provider]  zolpidem (AMBIEN) 5 MG tablet zolpidem 5 mg tablet   Yes [provider]  guaiFENesin-codeine 100-10 MG/5ML syrup Take 5 mLs by mouth every 6 (six) hours as needed for cough. Patient not taking: Reported on 07/02/2017 06/25/17   Leamon Arnt, MD  moxifloxacin (AVELOX) 400 MG tablet Take 1 tablet (400 mg total) by mouth daily at 8 pm. Patient not taking: Reported on 07/02/2017 06/25/17   Leamon Arnt, MD    Family History Family History  Problem Relation Age of Onset  . Diabetes Mother   . Hypertension Mother   . Heart disease Mother   . Diabetes Father   . Hypertension Father   . Stroke Father   . Heart disease Father   . Heart disease Brother   . Hypertension  Brother     Social History Social History   Tobacco Use  . Smoking status: Never Smoker  . Smokeless tobacco: Never Used  Substance Use Topics  . Alcohol use: Yes    Comment: rare beer  . Drug use: No     Allergies   Oxycodone; Penicillins; and Sulfa antibiotics   Review of Systems Review of Systems  All other systems reviewed and are negative.    Physical Exam Updated Vital Signs BP (!) 113/94   Pulse 87   Temp 99.5 F (37.5 C) (Oral)   Resp (!) 27   Ht 5\' 2"  (1.575 m)   Wt 82.6 kg (182 lb)   SpO2 90%   BMI 33.29 kg/m   Physical Exam  Constitutional: She is oriented to person, place, and time. She appears well-developed and well-nourished.  Non-toxic appearance. No distress.  HENT:  Head: Normocephalic and atraumatic.  Nose: Nose normal.  Mouth/Throat: Oropharynx is clear and moist.  Eyes: Conjunctivae are normal. Pupils are equal, round, and reactive to light. Right eye exhibits no discharge. Left eye exhibits no discharge.  Neck: Normal range of motion. Neck supple.  Cardiovascular: Normal rate, regular rhythm, normal heart sounds and intact distal pulses.  Pulmonary/Chest: No accessory muscle usage. Tachypnea noted. She is in respiratory distress (mild increase work of breathing). She has decreased breath sounds. She has rales (right lung field). She exhibits no tenderness.  Abdominal: Soft. Bowel sounds are normal. There is no tenderness. There is no rebound and no guarding.  Musculoskeletal: Normal range of motion. She exhibits no tenderness.  No lower extremity edema or calf tenderness.  Lymphadenopathy:    She has no cervical adenopathy.  Neurological: She is alert and oriented to person, place, and time.  Skin: Skin is warm and dry. Capillary refill takes less than 2 seconds.  Psychiatric: Her behavior is normal. Judgment and thought content normal.  Nursing note and vitals reviewed.  ED Treatments / Results  Labs (all labs ordered are listed, but  only abnormal results are displayed) Labs Reviewed  BASIC METABOLIC PANEL - Abnormal; Notable for the following components:      Result Value   Sodium 133 (*)    Potassium 2.7 (*)    Chloride 91 (*)    Glucose, Bld 111 (*)    Calcium 7.9 (*)    All other components within normal limits  CBC WITH DIFFERENTIAL/PLATELET - Abnormal; Notable for the following components:   WBC 21.7 (*)    Platelets 468 (*)    Neutro Abs 18.2 (*)    Monocytes Absolute 1.1 (*)    All other components within normal limits  I-STAT VENOUS BLOOD GAS, ED - Abnormal; Notable for the following components:   pO2, Ven 31.0 (*)    Bicarbonate 33.3 (*)    TCO2 35 (*)    Acid-Base Excess 7.0 (*)    All other components within normal limits  I-STAT CG4 LACTIC ACID, ED  I-STAT TROPONIN, ED    EKG  EKG Interpretation  Date/Time:  Tuesday July 02 2017 10:24:57 EST Ventricular Rate:  91 PR Interval:    QRS Duration: 107 QT Interval:  291 QTC Calculation: 358 R Axis:   -11 Text Interpretation:  Sinus rhythm LVH with secondary repolarization abnormality Interpretation limited secondary to artifact Confirmed by Fredia Sorrow [phone]) on 07/02/2017 11:30:19 AM       Radiology Ct Angio Chest Pe W/cm &/or Wo Cm  Result Date: 07/02/2017 CLINICAL DATA:  Pneumonia diagnosed 2 weeks ago. Patient states that she is feeling worse instead of getting better. Persistent low-grade fever. EXAM: CT ANGIOGRAPHY CHEST WITH CONTRAST TECHNIQUE: Multidetector CT imaging of the chest was performed using the standard protocol during bolus administration of intravenous contrast. Multiplanar CT image reconstructions and MIPs were obtained to evaluate the vascular anatomy. CONTRAST:  70 cc ISOVUE-370 IOPAMIDOL (ISOVUE-370) INJECTION 76% COMPARISON:  06/24/2017 and 07/02/2017 CXR FINDINGS: Cardiovascular: Heart is top-normal in size. 1 no pericardial effusion. Minimal aortic atherosclerosis at the arch. No aneurysm or dissection. No acute  pulmonary embolus. Normal branch pattern of the great vessels. Mediastinum/Nodes: Mildly enlarged prevascular and paratracheal lymph nodes. Bilateral hilar lymphadenopathy likely reactive, the largest is on the right measuring up to 16 mm short axis. Normal appearance of the thyroid gland without mass. Patent midline trachea and mainstem bronchi. Unremarkable  CT appearance of the esophagus. Lungs/Pleura: Diffuse bilateral airspace opacities are seen throughout all lobes of both lungs. No effusion or pneumothorax is identified. No dominant mass is visualized. Given what appear to be areas of air bronchograms especially in the upper lobes, favor a postinfectious etiology as opposed to pulmonary edema or hemorrhage. Upper Abdomen: No acute abnormality. Musculoskeletal: No chest wall abnormality. No acute or significant osseous findings. Review of the MIP images confirms the above findings. IMPRESSION: 1. Diffuse bilateral multilobar airspace opacities are identified, some areas demonstrating air bronchograms. Findings favor a postinfectious etiology/pneumonia, less likely stigmata of CHF/pulmonary edema or pulmonary hemorrhage. 2. Enlarged bilateral hilar and mildly enlarged mediastinal lymph nodes likely reactive in the setting of bilateral airspace disease. 3. No acute pulmonary embolus. 4. Minimal aortic atherosclerosis. Aortic Atherosclerosis (ICD10-I70.0). Electronically Signed   By: Ashley Royalty M.D.   On: 07/02/2017 14:11   Dg Chest Portable 1 View  Result Date: 07/02/2017 CLINICAL DATA:  57 year old female with shortness of breath for 5 days and productive cough. Diagnosed with pneumonia yesterday. EXAM: PORTABLE CHEST 1 VIEW COMPARISON:  06/24/2017 and earlier. FINDINGS: Portable AP upright view at 1042 hours. Chronic bilateral interstitial and reticulonodular pulmonary opacity. Superimposed new patchy right lung base opacity. No air bronchograms. No definite acute opacity in the left lung. No pleural  effusion or pneumothorax. Mediastinal contours remain normal. Visualized tracheal air column is within normal limits. No acute osseous abnormality identified. Negative visible bowel gas pattern. IMPRESSION: 1. Patchy right lower lung opacity superimposed on chronic interstitial lung disease is nonspecific but compatible with acute bronchopneumonia/pneumonia in this clinical setting. No pleural effusion is evident. 2. If pneumonia is suspected then follow-up PA and lateral chest X-ray is recommended in 3-4 weeks following trial of antibiotic therapy to ensure resolution and exclude underlying malignancy. Electronically Signed   By: Genevie Ann M.D.   On: 07/02/2017 11:00    Procedures Procedures (including critical care time)  Medications Ordered in ED Medications  ipratropium-albuterol (DUONEB) 0.5-2.5 (3) MG/3ML nebulizer solution 3 mL (3 mLs Nebulization Given 07/02/17 1039)  ipratropium-albuterol (DUONEB) 0.5-2.5 (3) MG/3ML nebulizer solution 3 mL (3 mLs Nebulization Given 07/02/17 1039)  potassium chloride 10 mEq in 100 mL IVPB (10 mEq Intravenous New Bag/Given 07/02/17 1217)  potassium chloride SA (K-DUR,KLOR-CON) CR tablet 40 mEq (40 mEq Oral Given 07/02/17 1212)  iopamidol (ISOVUE-370) 76 % injection (70 mLs Intravenous Contrast Given 07/02/17 1356)  acetaminophen (TYLENOL) tablet 1,000 mg (1,000 mg Oral Given 07/02/17 1252)     Initial Impression / Assessment and Plan / ED Course  I have reviewed the triage vital signs and the nursing notes.  Pertinent labs & imaging results that were available during my care of the patient were reviewed by me and considered in my medical decision making (see chart for details).     Patient presents to the emergency department today for evaluation of ongoing cough, congestion, pleuritic chest pain, sputum production, fevers and chills.  Has been on a 10-day course of Avelox and finished 2 days ago.  Patient seen by her primary care doctor last week and diagnosed with  pneumonia.  Was seen for follow-up today and noted to be hypoxic to the 80s on room air and sent to the ED for evaluation.  Patient is afebrile in the ED.  She is not tachycardic.  No hypotension is noted.  She is tachypneic and satting 80% on room air.  On 5 L of oxygen she is satting at 93%.  Patient with rales noted in the right lung fields with some mild expiratory wheezing noted.  No retractions noted.  Mild increased work of breathing.  No lower extremity edema.  Heart regular rate and rhythm.  No focal abdominal tenderness to palpation.   Patient does have a leukocytosis of 21,000.  She has a hypokalemia of 2.7 which was replaced with oral and IV potassium.  Otherwise elect lites are reassuring.  Normal kidney function.  Patient is not acidotic.  Troponin was negative.  Negative lactic acid.  Chest x-ray shows signs concerning for right lower lobe pneumonia.  Given that patient has been on Avelox concern for underlying malignancy versus PE.  CTA PE study was performed.  CT does show multifocal airspace opacities concerning for postinfectious pneumonia.  I did discuss with pharmacy concerning antibiotic dosing.  Patient on Avelox which should have a good typical and atypical coverage.  They felt like patient might not need further antibiotics.  She was given 2 DuoNeb's in the ED.  She was placed on high flow oxygen by respiratory.  Patient is satting at 97% on 3 L of oxygen.  She appears much improved after oxygen therapy.  She does not meet Sirs or sepsis criteria given normal lactic acid sepsis protocol was not initiated.  He went to the bathroom with normal gait without increased work of breathing.  Repeat lung exam shows no signs of extra wheezing.  Will hold on antibiotics at this time until discussed with hospital medicine to determine if antibiotics are indicated.  I spoke with Dr. Lorin Mercy with hospital medicine who states that she will see patient decide if she needs  antibiotics.  Final Clinical Impressions(s) / ED Diagnoses   Final diagnoses:  Community acquired pneumonia of right lung, unspecified part of lung  Hypoxia    ED Discharge Orders    None       Aaron Edelman 07/02/17 1506    Fredia Sorrow, MD 07/06/17 [phone]

## 2017-07-02 NOTE — ED Notes (Signed)
Patient ambulatory to restroom with a steady gait.

## 2017-07-02 NOTE — ED Notes (Signed)
Pt on the mnitior drssed into a gown

## 2017-07-03 DIAGNOSIS — R591 Generalized enlarged lymph nodes: Secondary | ICD-10-CM

## 2017-07-03 DIAGNOSIS — R945 Abnormal results of liver function studies: Secondary | ICD-10-CM

## 2017-07-03 DIAGNOSIS — J9601 Acute respiratory failure with hypoxia: Secondary | ICD-10-CM

## 2017-07-03 DIAGNOSIS — E876 Hypokalemia: Secondary | ICD-10-CM

## 2017-07-03 LAB — CBC WITH DIFFERENTIAL/PLATELET
BASOS ABS: 0 10*3/uL (ref 0.0–0.1)
BASOS PCT: 0 %
EOS ABS: 0.5 10*3/uL (ref 0.0–0.7)
Eosinophils Relative: 3 %
HCT: 34.1 % — ABNORMAL LOW (ref 36.0–46.0)
Hemoglobin: 10.9 g/dL — ABNORMAL LOW (ref 12.0–15.0)
LYMPHS ABS: 1.9 10*3/uL (ref 0.7–4.0)
Lymphocytes Relative: 12 %
MCH: 29.1 pg (ref 26.0–34.0)
MCHC: 32 g/dL (ref 30.0–36.0)
MCV: 90.9 fL (ref 78.0–100.0)
Monocytes Absolute: 1.1 10*3/uL — ABNORMAL HIGH (ref 0.1–1.0)
Monocytes Relative: 7 %
NEUTROS ABS: 12.2 10*3/uL — AB (ref 1.7–7.7)
Neutrophils Relative %: 78 %
Platelets: 407 10*3/uL — ABNORMAL HIGH (ref 150–400)
RBC: 3.75 MIL/uL — ABNORMAL LOW (ref 3.87–5.11)
RDW: 14 % (ref 11.5–15.5)
WBC: 15.7 10*3/uL — ABNORMAL HIGH (ref 4.0–10.5)

## 2017-07-03 LAB — RESPIRATORY PANEL BY PCR
Adenovirus: NOT DETECTED
Bordetella pertussis: NOT DETECTED
CORONAVIRUS NL63-RVPPCR: NOT DETECTED
CORONAVIRUS OC43-RVPPCR: NOT DETECTED
Chlamydophila pneumoniae: NOT DETECTED
Coronavirus 229E: NOT DETECTED
Coronavirus HKU1: NOT DETECTED
INFLUENZA A-RVPPCR: NOT DETECTED
Influenza B: NOT DETECTED
METAPNEUMOVIRUS-RVPPCR: NOT DETECTED
Mycoplasma pneumoniae: NOT DETECTED
PARAINFLUENZA VIRUS 1-RVPPCR: NOT DETECTED
PARAINFLUENZA VIRUS 2-RVPPCR: NOT DETECTED
PARAINFLUENZA VIRUS 3-RVPPCR: NOT DETECTED
PARAINFLUENZA VIRUS 4-RVPPCR: NOT DETECTED
RHINOVIRUS / ENTEROVIRUS - RVPPCR: NOT DETECTED
Respiratory Syncytial Virus: NOT DETECTED

## 2017-07-03 LAB — ANCA TITERS
Atypical P-ANCA titer: <1:20 {titer}
C-ANCA: <1:20 {titer}
P-ANCA: <1:20 {titer}

## 2017-07-03 LAB — EXTRACTABLE NUCLEAR ANTIGEN ANTIBODY
ENA SM Ab Ser-aCnc: <0.2 AI (ref 0.0–0.9)
RIBONUCLEIC PROTEIN: 0.3 AI (ref 0.0–0.9)
Scleroderma (Scl-70) (ENA) Antibody, IgG: <0.2 AI (ref 0.0–0.9)
ds DNA Ab: 1 IU/mL (ref 0–9)

## 2017-07-03 LAB — BASIC METABOLIC PANEL
Anion gap: 13 (ref 5–15)
CALCIUM: 7.8 mg/dL — AB (ref 8.9–10.3)
CO2: 27 mmol/L (ref 22–32)
CREATININE: 0.76 mg/dL (ref 0.44–1.00)
Chloride: 100 mmol/L — ABNORMAL LOW (ref 101–111)
GFR calc Af Amer: >60 mL/min (ref >60)
Glucose, Bld: 101 mg/dL — ABNORMAL HIGH (ref 65–99)
Potassium: 3.2 mmol/L — ABNORMAL LOW (ref 3.5–5.1)
Sodium: 140 mmol/L (ref 135–145)

## 2017-07-03 LAB — PROCALCITONIN: Procalcitonin: 0.1 ng/mL

## 2017-07-03 LAB — ANGIOTENSIN CONVERTING ENZYME: Angiotensin-Converting Enzyme: 20 U/L (ref 14–82)

## 2017-07-03 LAB — ALDOLASE: Aldolase: 18.9 U/L — ABNORMAL HIGH (ref 3.3–10.3)

## 2017-07-03 LAB — RHEUMATOID FACTOR: Rhuematoid fact SerPl-aCnc: 13.7 IU/mL (ref 0.0–13.9)

## 2017-07-03 LAB — CYCLIC CITRUL PEPTIDE ANTIBODY, IGG/IGA: CCP Antibodies IgG/IgA: 4 units (ref 0–19)

## 2017-07-03 LAB — MRSA PCR SCREENING: MRSA BY PCR: NEGATIVE

## 2017-07-03 MED ORDER — PREDNISONE 5 MG PO TABS
30.0000 mg | ORAL_TABLET | Freq: Every day | ORAL | Status: DC
  Administered 2017-07-03 – 2017-07-04 (×2): 30 mg via ORAL
  Filled 2017-07-03 (×3): qty 1

## 2017-07-03 MED ORDER — IPRATROPIUM-ALBUTEROL 0.5-2.5 (3) MG/3ML IN SOLN
3.0000 mL | RESPIRATORY_TRACT | Status: DC | PRN
  Filled 2017-07-03: qty 3

## 2017-07-03 MED ORDER — SODIUM CHLORIDE 0.9 % IN NEBU
3.0000 mL | INHALATION_SOLUTION | Freq: Three times a day (TID) | RESPIRATORY_TRACT | Status: DC
  Administered 2017-07-03 – 2017-07-04 (×4): 3 mL via RESPIRATORY_TRACT
  Filled 2017-07-03 (×7): qty 3

## 2017-07-03 MED ORDER — SODIUM CHLORIDE 0.9 % IV SOLN
500.0000 mg | INTRAVENOUS | Status: DC
  Administered 2017-07-03 – 2017-07-04 (×2): 500 mg via INTRAVENOUS
  Filled 2017-07-03 (×2): qty 500

## 2017-07-03 MED ORDER — MAGNESIUM SULFATE 2 GM/50ML IV SOLN
2.0000 g | Freq: Once | INTRAVENOUS | Status: AC
Start: 2017-07-03 — End: 2017-07-03
  Administered 2017-07-03: 2 g via INTRAVENOUS
  Filled 2017-07-03: qty 50

## 2017-07-03 MED ORDER — CEFTRIAXONE SODIUM 1 G IJ SOLR
1.0000 g | INTRAMUSCULAR | Status: DC
  Administered 2017-07-03 – 2017-07-04 (×2): 1 g via INTRAVENOUS
  Filled 2017-07-03 (×2): qty 10

## 2017-07-03 MED ORDER — GUAIFENESIN-CODEINE 100-10 MG/5ML PO SOLN
5.0000 mL | Freq: Four times a day (QID) | ORAL | Status: DC | PRN
  Administered 2017-07-03 – 2017-07-04 (×3): 5 mL via ORAL
  Filled 2017-07-03 (×3): qty 5

## 2017-07-03 MED ORDER — BUDESONIDE 0.5 MG/2ML IN SUSP
0.5000 mg | Freq: Two times a day (BID) | RESPIRATORY_TRACT | Status: DC
  Administered 2017-07-03 – 2017-07-04 (×3): 0.5 mg via RESPIRATORY_TRACT
  Filled 2017-07-03 (×3): qty 2

## 2017-07-03 MED ORDER — IPRATROPIUM-ALBUTEROL 0.5-2.5 (3) MG/3ML IN SOLN
3.0000 mL | Freq: Four times a day (QID) | RESPIRATORY_TRACT | Status: DC
  Administered 2017-07-03 – 2017-07-04 (×6): 3 mL via RESPIRATORY_TRACT
  Filled 2017-07-03 (×5): qty 3

## 2017-07-03 MED ORDER — POTASSIUM CHLORIDE CRYS ER 20 MEQ PO TBCR
40.0000 meq | EXTENDED_RELEASE_TABLET | Freq: Three times a day (TID) | ORAL | Status: AC
  Administered 2017-07-03 (×3): 40 meq via ORAL
  Filled 2017-07-03 (×3): qty 2

## 2017-07-03 NOTE — Consult Note (Signed)
PULMONARY / CRITICAL CARE MEDICINE   Name: Teresa Wells MRN: [id number] DOB: 1961-02-27    ADMISSION DATE:  07/02/2017 CONSULTATION DATE:  3/5  REFERRING MD:  Dr. Lorin Mercy  CHIEF COMPLAINT:  Hypoxia, diffuse infiltrates.   HISTORY OF PRESENT ILLNESS:   57 year old female with PMH as below with no significant past medical history presented to [facility] ED 3/5 with complaints of dyspnea and malaise for several months. She reports malaise dating all the way back to early December. She was very busy at work and attributed it to this. She also complained that walking moderate distances would have her winded. These symptoms slowly progressed. Toward the end of December she underwent the first step of a two step dental implant procedure. She was prescribed a Z-pack for peri-procedure prophylaxis.  She return for step 2 in early February.  The following day she flew to [place] and back and reports symptoms significantly worsening in the following days.  The following 3-4 weeks or significant for profound dyspnea and malaise.  She presented to her primary care provider with these complaints.  Chest x-ray concerning for pneumonia and she was started on a course of Avelox.  She returned to her PCP for follow-up after completing antibiotics and was as dyspneic as she was during her initial visit so her PCP sent her to the emergency department.  Upon arrival to the ED she was hypoxemic requiring 6 L of high flow nasal cannula oxygen.  CT scan of the chest demonstrated diffuse pulmonary infiltrates somewhat sparing the bases concerning for hypersensitivity pneumonitis.  Pulmonary has been asked to evaluate.  Currently she reports breathing comfortably on 6 L.  She denies any new recent exposures at home or in the workplace, denies knowledge of any tick bite, no pet birds, no medication changes with the exception of the recent course of Avelox.  She does have an implanted estrogen pump.  She denies muscle or joint  aches.  No fevers or chills.  No strong family history of autoimmune disease.  Also denies swelling of her lower extremities, orthopnea, PND.  She does complain of recent night sweats and unintentional weight gain.  She has not been tried on steroids.  PAST MEDICAL HISTORY :  She  has a past medical history of Abnormal Pap smear, Acute meniscal tear of knee (LEFT), Breast tumor (2009), Insomnia, Menopause, and Sinus infection (6 months ago).  PAST SURGICAL HISTORY: She  has a past surgical history that includes Bilateral salpingoophorectomy (2009); Hysteroscopy with resectoscope (2006); Knee arthroscopy (05/04/2011); Meniscus debridement (05/04/2011); Breast fibroadenoma surgery (2001); Incisional hernia repair (N/A, 11/12/2013); Insertion of mesh (N/A, 11/12/2013); Mole removal (Left, 11/12/2013); Open reduction internal fixation (orif) distal radial fracture (Left, 08/27/2014); and Carpal tunnel release (Left, 08/27/2014).  Allergies  Allergen Reactions  . Oxycodone   . Penicillins Hives  . Sulfa Antibiotics Hives    No current facility-administered medications on file prior to encounter.    Current Outpatient Medications on File Prior to Encounter  Medication Sig  . acetaminophen (TYLENOL) 500 MG tablet Take 1,000 mg by mouth every 6 (six) hours as needed for mild pain or moderate pain.   . Ascorbic Acid (VITAMIN C) 1000 MG tablet Take by mouth.  . cetirizine (ZYRTEC) 10 MG tablet Take 10 mg by mouth daily. Reported on 10/03/2015  . fluticasone (FLONASE) 50 MCG/ACT nasal spray Place 1 spray into both nostrils as needed for allergies.   Marland Kitchen ibuprofen (ADVIL,MOTRIN) 200 MG tablet Take 200  mg by mouth every 6 (six) hours as needed.  . Multiple Vitamin (MULTIVITAMIN) tablet Take 1 tablet by mouth daily. Reported on 10/03/2015  . pseudoephedrine (SUDAFED) 30 MG tablet Take 30 mg by mouth as needed for congestion.   Marland Kitchen zolpidem (AMBIEN) 5 MG tablet zolpidem 5 mg tablet    FAMILY HISTORY:  Her indicated  that her mother is alive. She indicated that her father is deceased. She indicated that her brother is alive.   SOCIAL HISTORY: She  reports that  has never smoked. she has never used smokeless tobacco. She reports that she does not drink alcohol or use drugs.  REVIEW OF SYSTEMS:   See HPI  SUBJECTIVE:  "feel like a bulldozer has pushed me off a cliff"  VITAL SIGNS: BP 136/65 (BP Location: Left Arm)   Pulse 84   Temp 98.3 F (36.8 C) (Oral)   Resp (!) 22   Ht _0  (1.575 m)   Wt 182 lb (82.6 kg)   SpO2 93%   BMI 33.29 kg/m   HEMODYNAMICS:    VENTILATOR SETTINGS:    INTAKE / OUTPUT: I/O last 3 completed shifts: In: 657.5 [I.V.:557.5; IV Piggyback:100] Out: -   PHYSICAL EXAMINATION: General:  Middle aged female of normal body habitus in NAD Neuro:  Alert, oriented, non-focal HEENT:  [place]/AT, PERRL, no JVD. OP clear, no abscess. Oral thrush.  Cardiovascular:  RRR, no MRG Lungs:  Coarse crackles throughout posterior lung fields. Mild tachypnea but improved from prior exam. Abdomen:  Soft, non-tender, non-distended Musculoskeletal:  No acute deformity or ROM limitation Skin:  Grossly intact  LABS:  BMET Recent Labs  Lab 07/02/17 1028 07/03/17 0645  NA 133* 140  K 2.7* 3.2*  CL 91* 100*  CO2 29 27  BUN 6 <5*  CREATININE 0.90 0.76  GLUCOSE 111* 101*    Electrolytes Recent Labs  Lab 07/02/17 1028 07/02/17 1830 07/03/17 0645  CALCIUM 7.9*  --  7.8*  MG  --  2.4  --     CBC Recent Labs  Lab 07/02/17 1028 07/03/17 0645  WBC 21.7* 15.7*  HGB 12.6 10.9*  HCT 37.7 34.1*  PLT 468* 407*    Coag's No results for input(s): APTT, INR in the last 168 hours.  Sepsis Markers Recent Labs  Lab 07/02/17 1053 07/02/17 1830 07/03/17 0645  LATICACIDVEN 1.53  --   --   PROCALCITON  --  0.30 0.10    ABG No results for input(s): PHART, PCO2ART, PO2ART in the last 168 hours.  Liver Enzymes Recent Labs  Lab 07/02/17 1830  AST 35  ALT 21  ALKPHOS  190*  BILITOT 1.0  ALBUMIN 2.4*    Cardiac Enzymes No results for input(s): TROPONINI, PROBNP in the last 168 hours.  Glucose No results for input(s): GLUCAP in the last 168 hours.  Imaging Ct Angio Chest Pe W/cm &/or Wo Cm  Result Date: 07/02/2017 CLINICAL DATA:  Pneumonia diagnosed 2 weeks ago. Patient states that she is feeling worse instead of getting better. Persistent low-grade fever. EXAM: CT ANGIOGRAPHY CHEST WITH CONTRAST TECHNIQUE: Multidetector CT imaging of the chest was performed using the standard protocol during bolus administration of intravenous contrast. Multiplanar CT image reconstructions and MIPs were obtained to evaluate the vascular anatomy. CONTRAST:  70 cc ISOVUE-370 IOPAMIDOL (ISOVUE-370) INJECTION 76% COMPARISON:  06/24/2017 and 07/02/2017 CXR FINDINGS: Cardiovascular: Heart is top-normal in size. 1 no pericardial effusion. Minimal aortic atherosclerosis at the arch. No aneurysm or dissection. No acute pulmonary embolus.  Normal branch pattern of the great vessels. Mediastinum/Nodes: Mildly enlarged prevascular and paratracheal lymph nodes. Bilateral hilar lymphadenopathy likely reactive, the largest is on the right measuring up to 16 mm short axis. Normal appearance of the thyroid gland without mass. Patent midline trachea and mainstem bronchi. Unremarkable CT appearance of the esophagus. Lungs/Pleura: Diffuse bilateral airspace opacities are seen throughout all lobes of both lungs. No effusion or pneumothorax is identified. No dominant mass is visualized. Given what appear to be areas of air bronchograms especially in the upper lobes, favor a postinfectious etiology as opposed to pulmonary edema or hemorrhage. Upper Abdomen: No acute abnormality. Musculoskeletal: No chest wall abnormality. No acute or significant osseous findings. Review of the MIP images confirms the above findings. IMPRESSION: 1. Diffuse bilateral multilobar airspace opacities are identified, some areas  demonstrating air bronchograms. Findings favor a postinfectious etiology/pneumonia, less likely stigmata of CHF/pulmonary edema or pulmonary hemorrhage. 2. Enlarged bilateral hilar and mildly enlarged mediastinal lymph nodes likely reactive in the setting of bilateral airspace disease. 3. No acute pulmonary embolus. 4. Minimal aortic atherosclerosis. Aortic Atherosclerosis (ICD10-I70.0). Electronically Signed   By: Ashley Royalty M.D.   On: 07/02/2017 14:11   Dg Chest Portable 1 View  Result Date: 07/02/2017 CLINICAL DATA:  57 year old female with shortness of breath for 5 days and productive cough. Diagnosed with pneumonia yesterday. EXAM: PORTABLE CHEST 1 VIEW COMPARISON:  06/24/2017 and earlier. FINDINGS: Portable AP upright view at 1042 hours. Chronic bilateral interstitial and reticulonodular pulmonary opacity. Superimposed new patchy right lung base opacity. No air bronchograms. No definite acute opacity in the left lung. No pleural effusion or pneumothorax. Mediastinal contours remain normal. Visualized tracheal air column is within normal limits. No acute osseous abnormality identified. Negative visible bowel gas pattern. IMPRESSION: 1. Patchy right lower lung opacity superimposed on chronic interstitial lung disease is nonspecific but compatible with acute bronchopneumonia/pneumonia in this clinical setting. No pleural effusion is evident. 2. If pneumonia is suspected then follow-up PA and lateral chest X-ray is recommended in 3-4 weeks following trial of antibiotic therapy to ensure resolution and exclude underlying malignancy. Electronically Signed   By: Genevie Ann M.D.   On: 07/02/2017 11:00     STUDIES:  CTA chest 3/5 > 1. Diffuse bilateral multilobar airspace opacities are identified, some areas demonstrating air bronchograms. Findings favor a postinfectious etiology/pneumonia, less likely stigmata of CHF/pulmonary edema or pulmonary hemorrhage. Enlarged bilateral hilar and mildly enlarged  mediastinal lymph nodes likely reactive in the setting of bilateral airspace disease.  CULTURES: RVP 3/5 negative  ANTIBIOTICS: None  SIGNIFICANT EVENTS: 3/5 admit >  LINES/TUBES:   ASSESSMENT / PLAN:  Acute hypoxemic respiratory failure: with diffuse pulmonary infiltrates on CT chest.  Based on relative sparing of the bases, this could represent a hypersensitivity pneumonitis versus other ILD such as AIP or Sarcoid. Less likely to represent an infectious pneumonia given lack of previous improvement with Avelox. 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 Generally based on her history it seems most likely the etiology is inflammatory in nature. Ideally we would like to have the diagnostic benefit of a bronchoscopy/biopsy, however, with her current degree of hypoxemia there is concern she would end up requiring mechanical ventilation, and would be difficult to liberate. RVP, CK, HIV, Rheumatoid factor and TSH are normal. Briefly had been weaned to lower O2 overnight. ESR and CRP are significant elevated.  Plan - Supplemental O2 to maintain SpO2 > 92% (currently on 5L) - Will  start Prednisone 30 mg daily and plan to continue for 2 weeks if there is improvement in symptoms with taper thereafter by 5 mg every week and follow up as outpatient for PFTs and further evaluation. - Reasonable to treat with antibiotics (although as above, suspect this less likely to be infectious) and scheduled nebs. Will also start Pulmicort 0.5 mg BID nebs. - Serologic workup ANCA, Hypersensitivity pneumonitis panel, ENA, CCP ANA, ACE Level and aldolase remain pending. Will add on total IgE. Follow up. - Long term she will likely need lung biopsy and possible EBUS for lymph node sampling given size of Lymph nodes  Marlise Eves, MD [facility] Pulmonology/Critical Care Pager [phone] or [phone]  07/03/2017 9:46 AM

## 2017-07-03 NOTE — Progress Notes (Signed)
PROGRESS NOTE  Teresa Wells NID:[id number] DOB: [birth year] DOA: 07/02/2017 PCP: Leamon Arnt, MD  HPI/Recap of past 24 hours: Teresa Wells is a 57 y.o. female with no significant past medical history presenting with persistent respiratory failure despite recent antibiotics.  She started having dental surgery done before Christmas.  She went back to the dentist on 2/6 and they placed dental implants and she took 5 days of Z-pack.  She flew to [place] the following morning and returned that evening and "from then on I have been sick."  Chest x-ray on admission revealed bilateral lower lobe infiltrates.  Respiratory viral panel negative.  Hypoxic requiring 5 L of oxygen to maintain O2 saturation greater than 90.  Patient admitted for acute hypoxic respiratory failure secondary to community-acquired pneumonia.  07/03/2017: Patient seen and examined with her husband at bedside.  Reports dyspnea is improved however is persistent.  She denies chest pain or palpitations.  She continues to be on 5 L and saturating 92%.  Cx-ray as reviewed by myself revealed bilateral pulmonary infiltrates suggestive of moderate to severe community-acquired pneumonia.  Assessment/Plan: Principal Problem:   Acute hypoxemic respiratory failure (HCC) Active Problems:   Adenopathy   Hypokalemia   Elevated LFTs  Acute hypoxic respiratory failure secondary to bilateral lower lobe community-acquired pneumonia -Started IV azithromycin and IV ceftriaxone -Blood cultures and sputum cultures in process -Started duo nebs every 6 hours and every 2 hours as needed Apresoline nebs chest physiotherapy -CCM following.  Highly appreciated -Continue O2 supplementation to maintain O2 saturation greater than 92% -Continue prednisone, Pulmicort 0.5 mg twice daily nebs as recommended by CCM -Close monitoring overnight  Suspect that hypersensitivity pneumonitis versus other ILD -CCM following -Further testings are  pending  Hypokalemia -Potassium 3.2 -Replete date with potassium chloride supplement and IV magnesium   Code Status: Full  Family Communication: Husband at bedside  Disposition Plan: Home when hemodynamically stable   Consultants:  Critical care medicine  Procedures:  None  Antimicrobials:  IV ceftriaxone and IV azithromycin  DVT prophylaxis: SCDs   Objective: Vitals:   07/03/17 1130 07/03/17 1133 07/03/17 1458 07/03/17 1545  BP: 136/66   (!) 119/54  Pulse: 87   93  Resp: 18   20  Temp: 98 F (36.7 C)   97.8 F (36.6 C)  TempSrc: Oral   Oral  SpO2: 98% 98% 94% 93%  Weight:      Height:        Intake/Output Summary (Last 24 hours) at 07/03/2017 1744 Last data filed at 07/03/2017 0400 Gross per 24 hour  Intake 557.5 ml  Output -  Net 557.5 ml   Filed Weights   07/02/17 1030  Weight: 82.6 kg (182 lb)    Exam:   General: 57 year old Caucasian female well-developed well-nourished alert oriented x3 appears mildly uncomfortable due to persistent cough.  Cardiovascular: Regular rate and rhythm with no rubs or gallops  Respiratory: Diffuse rales bilaterally  Abdomen: Soft nontender nondistended normal bowel sounds x4 quadrant  Musculoskeletal: No focal abnormalities  Skin: Mild rash in the back  Psychiatry: Mood is appropriate   Data Reviewed: CBC: Recent Labs  Lab 07/02/17 1028 07/03/17 0645  WBC 21.7* 15.7*  NEUTROABS 18.2* 12.2*  HGB 12.6 10.9*  HCT 37.7 34.1*  MCV 90.6 90.9  PLT 468* 144*   Basic Metabolic Panel: Recent Labs  Lab 07/02/17 1028 07/02/17 1830 07/03/17 0645  NA 133*  --  140  K 2.7*  --  3.2*  CL 91*  --  100*  CO2 29  --  27  GLUCOSE 111*  --  101*  BUN 6  --  <5*  CREATININE 0.90  --  0.76  CALCIUM 7.9*  --  7.8*  MG  --  2.4  --    GFR: Estimated Creatinine Clearance: 76.4 mL/min (by C-G formula based on SCr of 0.76 mg/dL). Liver Function Tests: Recent Labs  Lab 07/02/17 1830  AST 35  ALT 21   ALKPHOS 190*  BILITOT 1.0  PROT 7.0  ALBUMIN 2.4*   No results for input(s): LIPASE, AMYLASE in the last 168 hours. No results for input(s): AMMONIA in the last 168 hours. Coagulation Profile: No results for input(s): INR, PROTIME in the last 168 hours. Cardiac Enzymes: Recent Labs  Lab 07/02/17 1830  CKTOTAL 163   BNP (last 3 results) No results for input(s): PROBNP in the last 8760 hours. HbA1C: No results for input(s): HGBA1C in the last 72 hours. CBG: No results for input(s): GLUCAP in the last 168 hours. Lipid Profile: No results for input(s): CHOL, HDL, LDLCALC, TRIG, CHOLHDL, LDLDIRECT in the last 72 hours. Thyroid Function Tests: Recent Labs    07/02/17 1830  TSH 4.307   Anemia Panel: No results for input(s): VITAMINB12, FOLATE, FERRITIN, TIBC, IRON, RETICCTPCT in the last 72 hours. Urine analysis:    Component Value Date/Time   COLORURINE YELLOW 07/02/2017 2120   APPEARANCEUR CLEAR 07/02/2017 2120   LABSPEC 1.017 07/02/2017 2120   PHURINE 7.0 07/02/2017 2120   GLUCOSEU NEGATIVE 07/02/2017 2120   GLUCOSEU NEGATIVE 10/04/2015 0730   HGBUR SMALL (A) 07/02/2017 2120   BILIRUBINUR NEGATIVE 07/02/2017 2120   BILIRUBINUR Neg 12/23/2013 1123   KETONESUR NEGATIVE 07/02/2017 2120   PROTEINUR NEGATIVE 07/02/2017 2120   UROBILINOGEN 0.2 10/04/2015 0730   NITRITE NEGATIVE 07/02/2017 2120   LEUKOCYTESUR NEGATIVE 07/02/2017 2120   Sepsis Labs: @LABRCNTIP (procalcitonin:4,lacticidven:4)  ) Recent Results (from the past 240 hour(s))  Culture, blood (routine x 2) Call MD if unable to obtain prior to antibiotics being given     Status: None (Preliminary result)   Collection Time: 07/02/17 10:38 AM  Result Value Ref Range Status   Specimen Description BLOOD LEFT ANTECUBITAL  Final   Special Requests   Final    BOTTLES DRAWN AEROBIC AND ANAEROBIC Blood Culture adequate volume   Culture   Final    NO GROWTH < 24 HOURS Performed at [facility], [place] [address], [place], [place] [address]    Report Status PENDING  Incomplete  Culture, blood (routine x 2) Call MD if unable to obtain prior to antibiotics being given     Status: None (Preliminary result)   Collection Time: 07/02/17 11:00 AM  Result Value Ref Range Status   Specimen Description BLOOD LEFT FOREARM  Final   Special Requests   Final    BOTTLES DRAWN AEROBIC AND ANAEROBIC Blood Culture adequate volume   Culture   Final    NO GROWTH < 24 HOURS Performed at [facility], [place] [address], [place], [place] [address]    Report Status PENDING  Incomplete  Respiratory Panel by PCR     Status: None   Collection Time: 07/02/17  4:50 PM  Result Value Ref Range Status   Adenovirus NOT DETECTED NOT DETECTED Final   Coronavirus 229E NOT DETECTED NOT DETECTED Final   Coronavirus HKU1 NOT DETECTED NOT DETECTED Final   Coronavirus NL63 NOT DETECTED NOT DETECTED Final  Coronavirus OC43 NOT DETECTED NOT DETECTED Final   Metapneumovirus NOT DETECTED NOT DETECTED Final   Rhinovirus / Enterovirus NOT DETECTED NOT DETECTED Final   Influenza A NOT DETECTED NOT DETECTED Final   Influenza B NOT DETECTED NOT DETECTED Final   Parainfluenza Virus 1 NOT DETECTED NOT DETECTED Final   Parainfluenza Virus 2 NOT DETECTED NOT DETECTED Final   Parainfluenza Virus 3 NOT DETECTED NOT DETECTED Final   Parainfluenza Virus 4 NOT DETECTED NOT DETECTED Final   Respiratory Syncytial Virus NOT DETECTED NOT DETECTED Final   Bordetella pertussis NOT DETECTED NOT DETECTED Final   Chlamydophila pneumoniae NOT DETECTED NOT DETECTED Final   Mycoplasma pneumoniae NOT DETECTED NOT DETECTED Final    Comment: Performed at [facility], [place] [address], [place], [place] [address]  MRSA PCR Screening     Status: None   Collection Time: 07/03/17  3:51 PM  Result Value Ref Range Status   MRSA by PCR NEGATIVE NEGATIVE Final    Comment:        The GeneXpert MRSA Assay (FDA approved for NASAL specimens only), is one  component of a comprehensive MRSA colonization surveillance program. It is not intended to diagnose MRSA infection nor to guide or monitor treatment for MRSA infections. Performed at [facility], [place] [address], [place], [place] [address]       Studies: No results found.  Scheduled Meds: . budesonide (PULMICORT) nebulizer solution  0.5 mg Nebulization BID  . enoxaparin (LOVENOX) injection  40 mg Subcutaneous Q24H  . ipratropium-albuterol  3 mL Nebulization Q6H  . loratadine  10 mg Oral Daily  . multivitamin with minerals  1 tablet Oral Daily  . nystatin  5 mL Oral QID  . potassium chloride  40 mEq Oral TID  . predniSONE  30 mg Oral Q breakfast  . sodium chloride  3 mL Nebulization TID    Continuous Infusions: . sodium chloride 75 mL/hr at 07/03/17 1110  . azithromycin Stopped (07/03/17 1213)  . cefTRIAXone (ROCEPHIN)  IV Stopped (07/03/17 1141)     LOS: 1 day     Kayleen Memos, MD [facility] Pager [phone] If 7PM-7AM, please contact night-coverage [email or web address] Password [email or web address] 07/03/2017, 5:44 PM

## 2017-07-04 DIAGNOSIS — J9601 Acute respiratory failure with hypoxia: Principal | ICD-10-CM

## 2017-07-04 LAB — LEGIONELLA PNEUMOPHILA SEROGP 1 UR AG: L. pneumophila Serogp 1 Ur Ag: NEGATIVE

## 2017-07-04 LAB — PROCALCITONIN: Procalcitonin: <0.1 ng/mL

## 2017-07-04 LAB — MPO/PR-3 (ANCA) ANTIBODIES: Myeloperoxidase Abs: <9 U/mL (ref 0.0–9.0)

## 2017-07-04 MED ORDER — METHYLPREDNISOLONE SODIUM SUCC 125 MG IJ SOLR
60.0000 mg | Freq: Three times a day (TID) | INTRAMUSCULAR | Status: DC
  Administered 2017-07-04 – 2017-07-05 (×4): 60 mg via INTRAVENOUS
  Filled 2017-07-04 (×3): qty 2

## 2017-07-04 NOTE — Care Management Note (Signed)
Case Management Note  Patient Details  Name: Teresa Wells MRN: [id number] Date of Birth: November 17, 1960  Subjective/Objective:       Pt admitted with acute hypoxemic respiratory failure. She is from home with spouse.             Action/Plan: Plan is for patient to return home when medically stable. CM following.  Expected Discharge Date:                  Expected Discharge Plan:  Home/Self Care  In-House Referral:     Discharge planning Services     Post Acute Care Choice:    Choice offered to:     DME Arranged:    DME Agency:     HH Arranged:    HH Agency:     Status of Service:  In process, will continue to follow  If discussed at Long Length of Stay Meetings, dates discussed:    Additional Comments:  Pollie Friar, RN 07/04/2017, 11:46 AM

## 2017-07-04 NOTE — Progress Notes (Addendum)
PULMONARY / CRITICAL CARE MEDICINE   Name: Teresa Wells MRN: [id number] DOB: [birth year]    ADMISSION DATE:  07/02/2017 CONSULTATION DATE:  07/02/2017  REFERRING MD:  Dr. Lorin Mercy  CHIEF COMPLAINT:  hypoxia  HISTORY OF PRESENT ILLNESS:   57 yo female presented to ER with dyspnea, malaise for several months.  She was tx as outpt for pneumonia.  She was noted to be hypoxic in ER.  CT chest showed diffuse pulmonary infiltrates.  SUBJECTIVE:  Breathing improved.  Has intermittent dry cough.  Doesn't feel like neb tx are helping.  VITAL SIGNS: BP (!) 118/59 (BP Location: Right Arm)   Pulse 77   Temp 97.8 F (36.6 C) (Axillary)   Resp 18   Ht '5\' 2"'$  (1.575 m)   Wt 182 lb (82.6 kg)   SpO2 98%   BMI 33.29 kg/m   INTAKE / OUTPUT: I/O last 3 completed shifts: In: 2782.5 [I.V.:2432.5; IV Piggyback:350] Out: -   PHYSICAL EXAMINATION:  General - pleasant Eyes - pupils reactive ENT - no sinus tenderness, no oral exudate, no LAN Cardiac - regular, no murmur Chest - b/l crackles Abd - soft, non tender Ext - no edema Skin - no rashes Neuro - normal strength Psych - normal mood   LABS:  BMET Recent Labs  Lab 07/02/17 1028 07/03/17 0645  NA 133* 140  K 2.7* 3.2*  CL 91* 100*  CO2 29 27  BUN 6 <5*  CREATININE 0.90 0.76  GLUCOSE 111* 101*    Electrolytes Recent Labs  Lab 07/02/17 1028 07/02/17 1830 07/03/17 0645  CALCIUM 7.9*  --  7.8*  MG  --  2.4  --     CBC Recent Labs  Lab 07/02/17 1028 07/03/17 0645  WBC 21.7* 15.7*  HGB 12.6 10.9*  HCT 37.7 34.1*  PLT 468* 407*    Coag's No results for input(s): APTT, INR in the last 168 hours.  Sepsis Markers Recent Labs  Lab 07/02/17 1053 07/02/17 1830 07/03/17 0645 07/04/17 0303  LATICACIDVEN 1.53  --   --   --   PROCALCITON  --  0.30 0.10 <0.10    ABG No results for input(s): PHART, PCO2ART, PO2ART in the last 168 hours.  Liver Enzymes Recent Labs  Lab 07/02/17 1830  AST 35  ALT 21  ALKPHOS  190*  BILITOT 1.0  ALBUMIN 2.4*    Cardiac Enzymes No results for input(s): TROPONINI, PROBNP in the last 168 hours.  Glucose No results for input(s): GLUCAP in the last 168 hours.  Imaging No results found.   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  She has clinical improvement after starting steroids.  She has mild elevation in aldolase, but this is of uncertain significance.  ASSESSMENT / PLAN:  Acute hypoxic respiratory failure with diffuse pulmonary infiltrates. - HP panel still pending from 3/05 - continue oxygen to keep SpO2 > 92% - change to IV solumedrol - f/u CXR - procalcitonin negative >> will d/c Abx - defer bronchoscopy for now  Chesley Mires, MD [place] 07/04/2017, 2:10 PM Pager:  [phone]  After 3pm call: [phone]

## 2017-07-04 NOTE — Progress Notes (Signed)
PROGRESS NOTE  [name]:[id number] DOB: 10/23/1960 DOA: 07/02/2017 PCP: Leamon Arnt, MD  HPI/Recap of past 24 hours: Teresa Wells is a 57 y.o. female with no significant past medical history presenting with persistent respiratory failure despite recent antibiotics.  She started having dental surgery done before Christmas.  She went back to the dentist on 2/6 and they placed dental implants and she took 5 days of Z-pack.  She flew to [place] the following morning and returned that evening and "from then on I have been sick."  Chest x-ray on admission revealed bilateral lower lobe infiltrates.  Respiratory viral panel negative.  Hypoxic requiring 5 L of oxygen to maintain O2 saturation greater than 90.  Patient admitted for acute hypoxic respiratory failure secondary to community-acquired pneumonia.  07/03/2017: Patient seen and examined with her husband at bedside.  Reports dyspnea is improved however is persistent.  She denies chest pain or palpitations.  She continues to be on 5 L and saturating 92%.  Cx-ray as reviewed by myself revealed bilateral pulmonary infiltrates suggestive of moderate to severe community-acquired pneumonia.  07/04/2017: Patient reports she feels much better today, dyspnea is improved. O2 supplementation requirement is also improving. she denies any chest pain dyspnea at rest or palpitation.  Cough is also improving.  Assessment/Plan: Principal Problem:   Acute hypoxemic respiratory failure (HCC) Active Problems:   Adenopathy   Hypokalemia   Elevated LFTs  Acute hypoxic respiratory failure secondary to bilateral lower lobe community-acquired pneumonia - IV azithromycin and IV ceftriaxone stopped by pulmonology today -Blood cultures and sputum cultures in process -duo nebs every 6 hours and every 2 hours as needed  hyper saline nebs chest physiotherapy -Pulmonology following.  Highly appreciated -Continue O2 supplementation to maintain O2 saturation greater  than 92% -Continue Pulmicort 0.5 mg twice daily nebs as recommended by CCM  -prednisone changed to IV Solu-Medrol by pulmonology -Close monitoring overnight  Suspect that hypersensitivity pneumonitis versus other ILD -CCM following -Further testings are still pending  Hypokalemia -Potassium 3.2 -Replete date with potassium chloride supplement and IV magnesium -BMP and magnesium ordered for the morning   Code Status: Full  Family Communication: None at bedside  Disposition Plan: Home when off oxygen and hyper sensitivity pneumonitis tests result.  Consultants: Pulmonology  Procedures:  None  Antimicrobials: None  DVT prophylaxis: SCDs   Objective: Vitals:   07/04/17 0841 07/04/17 1127 07/04/17 1350 07/04/17 1540  BP: 110/62 (!) 118/59  116/69  Pulse: 88 77  84  Resp: 20 18  18   Temp: (!) 97.3 F (36.3 C) 97.8 F (36.6 C)  97.9 F (36.6 C)  TempSrc: Oral Axillary  Axillary  SpO2: 96% 98% 100% 95%  Weight:      Height:        Intake/Output Summary (Last 24 hours) at 07/04/2017 1815 Last data filed at 07/04/2017 1504 Gross per 24 hour  Intake 3885 ml  Output -  Net 3885 ml   Filed Weights   07/02/17 1030  Weight: 82.6 kg (182 lb)    Exam: 07/04/2017.  Patient seen and examined at bedside.  Physical exam is essentially unchanged from prior.  Except for mentioned below   General: 57 year old Caucasian female well-developed well-nourished alert oriented x3 appears no acute distress   cardiovascular: Regular rate and rhythm with no rubs or gallops  Respiratory:  mild  rales bilaterally  Abdomen: Soft nontender nondistended normal bowel sounds x4 quadrant  Musculoskeletal: No focal abnormalities  Skin: Mild rash  in the back  Psychiatry: Mood is appropriate   Data Reviewed: CBC: Recent Labs  Lab 07/02/17 1028 07/03/17 0645  WBC 21.7* 15.7*  NEUTROABS 18.2* 12.2*  HGB 12.6 10.9*  HCT 37.7 34.1*  MCV 90.6 90.9  PLT 468* 407*   Basic  Metabolic Panel: Recent Labs  Lab 07/02/17 1028 07/02/17 1830 07/03/17 0645  NA 133*  --  140  K 2.7*  --  3.2*  CL 91*  --  100*  CO2 29  --  27  GLUCOSE 111*  --  101*  BUN 6  --  <5*  CREATININE 0.90  --  0.76  CALCIUM 7.9*  --  7.8*  MG  --  2.4  --    GFR: Estimated Creatinine Clearance: 76.4 mL/min (by C-G formula based on SCr of 0.76 mg/dL). Liver Function Tests: Recent Labs  Lab 07/02/17 1830  AST 35  ALT 21  ALKPHOS 190*  BILITOT 1.0  PROT 7.0  ALBUMIN 2.4*   No results for input(s): LIPASE, AMYLASE in the last 168 hours. No results for input(s): AMMONIA in the last 168 hours. Coagulation Profile: No results for input(s): INR, PROTIME in the last 168 hours. Cardiac Enzymes: Recent Labs  Lab 07/02/17 1830  CKTOTAL 163   BNP (last 3 results) No results for input(s): PROBNP in the last 8760 hours. HbA1C: No results for input(s): HGBA1C in the last 72 hours. CBG: No results for input(s): GLUCAP in the last 168 hours. Lipid Profile: No results for input(s): CHOL, HDL, LDLCALC, TRIG, CHOLHDL, LDLDIRECT in the last 72 hours. Thyroid Function Tests: Recent Labs    07/02/17 1830  TSH 4.307   Anemia Panel: No results for input(s): VITAMINB12, FOLATE, FERRITIN, TIBC, IRON, RETICCTPCT in the last 72 hours. Urine analysis:    Component Value Date/Time   COLORURINE YELLOW 07/02/2017 2120   APPEARANCEUR CLEAR 07/02/2017 2120   LABSPEC 1.017 07/02/2017 2120   PHURINE 7.0 07/02/2017 2120   GLUCOSEU NEGATIVE 07/02/2017 2120   GLUCOSEU NEGATIVE 10/04/2015 0730   HGBUR SMALL (A) 07/02/2017 2120   BILIRUBINUR NEGATIVE 07/02/2017 2120   BILIRUBINUR Neg 12/23/2013 1123   KETONESUR NEGATIVE 07/02/2017 2120   PROTEINUR NEGATIVE 07/02/2017 2120   UROBILINOGEN 0.2 10/04/2015 0730   NITRITE NEGATIVE 07/02/2017 2120   LEUKOCYTESUR NEGATIVE 07/02/2017 2120   Sepsis Labs: @LABRCNTIP (procalcitonin:4,lacticidven:4)  ) Recent Results (from the past 240 hour(s))    Culture, blood (routine x 2) Call MD if unable to obtain prior to antibiotics being given     Status: None (Preliminary result)   Collection Time: 07/02/17 10:38 AM  Result Value Ref Range Status   Specimen Description BLOOD LEFT ANTECUBITAL  Final   Special Requests   Final    BOTTLES DRAWN AEROBIC AND ANAEROBIC Blood Culture adequate volume   Culture   Final    NO GROWTH 2 DAYS Performed at [facility], [place] [address] [place], [place] [address]    Report Status PENDING  Incomplete  Culture, blood (routine x 2) Call MD if unable to obtain prior to antibiotics being given     Status: None (Preliminary result)   Collection Time: 07/02/17 11:00 AM  Result Value Ref Range Status   Specimen Description BLOOD LEFT FOREARM  Final   Special Requests   Final    BOTTLES DRAWN AEROBIC AND ANAEROBIC Blood Culture adequate volume   Culture   Final    NO GROWTH 2 DAYS Performed at [facility], [place]  [address], [place], [place] [address]    Report Status PENDING  Incomplete  Respiratory Panel by PCR     Status: None   Collection Time: 07/02/17  4:50 PM  Result Value Ref Range Status   Adenovirus NOT DETECTED NOT DETECTED Final   Coronavirus 229E NOT DETECTED NOT DETECTED Final   Coronavirus HKU1 NOT DETECTED NOT DETECTED Final   Coronavirus NL63 NOT DETECTED NOT DETECTED Final   Coronavirus OC43 NOT DETECTED NOT DETECTED Final   Metapneumovirus NOT DETECTED NOT DETECTED Final   Rhinovirus / Enterovirus NOT DETECTED NOT DETECTED Final   Influenza A NOT DETECTED NOT DETECTED Final   Influenza B NOT DETECTED NOT DETECTED Final   Parainfluenza Virus 1 NOT DETECTED NOT DETECTED Final   Parainfluenza Virus 2 NOT DETECTED NOT DETECTED Final   Parainfluenza Virus 3 NOT DETECTED NOT DETECTED Final   Parainfluenza Virus 4 NOT DETECTED NOT DETECTED Final   Respiratory Syncytial Virus NOT DETECTED NOT DETECTED Final   Bordetella pertussis NOT DETECTED NOT DETECTED Final   Chlamydophila  pneumoniae NOT DETECTED NOT DETECTED Final   Mycoplasma pneumoniae NOT DETECTED NOT DETECTED Final    Comment: Performed at [facility], [place] [address] [place], [place] [address]  MRSA PCR Screening     Status: None   Collection Time: 07/03/17  3:51 PM  Result Value Ref Range Status   MRSA by PCR NEGATIVE NEGATIVE Final    Comment:        The GeneXpert MRSA Assay (FDA approved for NASAL specimens only), is one component of a comprehensive MRSA colonization surveillance program. It is not intended to diagnose MRSA infection nor to guide or monitor treatment for MRSA infections. Performed at [facility], [place] [address], [place], [place] [address]       Studies: No results found.  Scheduled Meds: . enoxaparin (LOVENOX) injection  40 mg Subcutaneous Q24H  . loratadine  10 mg Oral Daily  . methylPREDNISolone (SOLU-MEDROL) injection  60 mg Intravenous Q8H  . multivitamin with minerals  1 tablet Oral Daily  . nystatin  5 mL Oral QID  . sodium chloride  3 mL Nebulization TID    Continuous Infusions: . sodium chloride 75 mL/hr at 07/04/17 0452     LOS: 2 days     Kayleen Memos, MD [facility] Pager [phone] If 7PM-7AM, please contact night-coverage [email or web address] Password [facility] 07/04/2017, 6:15 PM

## 2017-07-04 NOTE — Progress Notes (Signed)
Metaneb attempted for 10 minutes in place of chest vest. Patient tolerated well and says she prefers this mode of chest PT.

## 2017-07-05 ENCOUNTER — Inpatient Hospital Stay (HOSPITAL_COMMUNITY): Payer: 59

## 2017-07-05 DIAGNOSIS — R918 Other nonspecific abnormal finding of lung field: Secondary | ICD-10-CM

## 2017-07-05 DIAGNOSIS — R0902 Hypoxemia: Secondary | ICD-10-CM

## 2017-07-05 DIAGNOSIS — J189 Pneumonia, unspecified organism: Secondary | ICD-10-CM

## 2017-07-05 DIAGNOSIS — R0989 Other specified symptoms and signs involving the circulatory and respiratory systems: Secondary | ICD-10-CM

## 2017-07-05 LAB — HYPERSENSITIVITY PNEUMONITIS
A. FUMIGATUS #1 ABS: NEGATIVE
A. PULLULANS ABS: NEGATIVE
Micropolyspora faeni, IgG: NEGATIVE
Pigeon Serum Abs: NEGATIVE
THERMOACT. SACCHARII: NEGATIVE
THERMOACTINOMYCES VULGARIS IGG: NEGATIVE

## 2017-07-05 LAB — CBC
HCT: 39 % (ref 36.0–46.0)
Hemoglobin: 12 g/dL (ref 12.0–15.0)
MCH: 29.6 pg (ref 26.0–34.0)
MCHC: 30.8 g/dL (ref 30.0–36.0)
MCV: 96.1 fL (ref 78.0–100.0)
PLATELETS: 556 10*3/uL — AB (ref 150–400)
RBC: 4.06 MIL/uL (ref 3.87–5.11)
RDW: 14.9 % (ref 11.5–15.5)
WBC: 14.9 10*3/uL — ABNORMAL HIGH (ref 4.0–10.5)

## 2017-07-05 LAB — MAGNESIUM: Magnesium: 2.1 mg/dL (ref 1.7–2.4)

## 2017-07-05 LAB — BASIC METABOLIC PANEL
ANION GAP: 13 (ref 5–15)
BUN: 5 mg/dL — ABNORMAL LOW (ref 6–20)
CALCIUM: 8.7 mg/dL — AB (ref 8.9–10.3)
CO2: 24 mmol/L (ref 22–32)
Chloride: 106 mmol/L (ref 101–111)
Creatinine, Ser: 0.71 mg/dL (ref 0.44–1.00)
GFR calc non Af Amer: >60 mL/min (ref >60)
Glucose, Bld: 149 mg/dL — ABNORMAL HIGH (ref 65–99)
Potassium: 4.5 mmol/L (ref 3.5–5.1)
Sodium: 143 mmol/L (ref 135–145)

## 2017-07-05 LAB — EXPECTORATED SPUTUM ASSESSMENT W REFEX TO RESP CULTURE

## 2017-07-05 LAB — ANTINUCLEAR ANTIBODIES, IFA: ANTINUCLEAR ANTIBODIES, IFA: NEGATIVE

## 2017-07-05 LAB — EXPECTORATED SPUTUM ASSESSMENT W GRAM STAIN, RFLX TO RESP C

## 2017-07-05 MED ORDER — PREDNISONE 20 MG PO TABS: ORAL_TABLET | ORAL | 0 refills | Status: DC

## 2017-07-05 MED ORDER — NYSTATIN 100000 UNIT/ML MT SUSP: 5.0000 mL | Freq: Four times a day (QID) | OROMUCOSAL | 0 refills | Status: DC

## 2017-07-05 MED ORDER — BENZONATATE 100 MG PO CAPS: 100.0000 mg | ORAL_CAPSULE | Freq: Four times a day (QID) | ORAL | 0 refills | Status: DC | PRN

## 2017-07-05 NOTE — Progress Notes (Addendum)
PULMONARY / CRITICAL CARE MEDICINE   Name: Teresa Wells MRN: [id number] DOB: [birth year]    ADMISSION DATE:  07/02/2017 CONSULTATION DATE:  07/02/2017  REFERRING MD:  Dr. Lorin Mercy  CHIEF COMPLAINT:  hypoxia  HISTORY OF PRESENT ILLNESS:   57 yo female presented to ER with dyspnea, malaise for several months.  She was tx as outpt for pneumonia.  She was noted to be hypoxic in ER.  CT chest showed diffuse pulmonary infiltrates.  SUBJECTIVE:  Significant improvement overnight . Has not required neb treatments. Remains on RA. Responded well to IV steroids with improvement in infiltrates per CXR. Rare cough  which is dry. She is anxious to go home.  VITAL SIGNS: BP 135/72 (BP Location: Left Arm)   Pulse 91   Temp 97.9 F (36.6 C) (Oral)   Resp 18   Ht 5' 2" (1.575 m)   Wt 182 lb (82.6 kg)   SpO2 97%   BMI 33.29 kg/m   INTAKE / OUTPUT: I/O last 3 completed shifts: In: 62 [P.O.:1800; I.V.:2550; IV Piggyback:450] Out: -   PHYSICAL EXAMINATION:  General - pleasant, awake and alert, anxious to go home Eyes - PERRLA ENT - no sinus tenderness, no oral exudate, no LAN Cardiac - S1, S2, RRR, no murmur, rub or gallop Chest - b/l crackles, diminished per bases Abd - soft, non tender, ND, non- obese Ext - No obvious abnormalities noted Skin - no rashes, lesions Neuro - normal strength, MAE x 4, Alert and oriented x 3, appropriate Psych - normal mood   LABS:  BMET Recent Labs  Lab 07/02/17 1028 07/03/17 0645 07/05/17 0333  NA 133* 140 143  K 2.7* 3.2* 4.5  CL 91* 100* 106  CO2 _0 BUN 6 <5* 5*  CREATININE 0.90 0.76 0.71  GLUCOSE 111* 101* 149*    Electrolytes Recent Labs  Lab 07/02/17 1028 07/02/17 1830 07/03/17 0645 07/05/17 0333  CALCIUM 7.9*  --  7.8* 8.7*  MG  --  2.4  --  2.1    CBC Recent Labs  Lab 07/02/17 1028 07/03/17 0645 07/05/17 0942  WBC 21.7* 15.7* 14.9*  HGB 12.6 10.9* 12.0  HCT 37.7 34.1* 39.0  PLT 468* 407* 556*    Coag's No  results for input(s): APTT, INR in the last 168 hours.  Sepsis Markers Recent Labs  Lab 07/02/17 1053 07/02/17 1830 07/03/17 0645 07/04/17 0303  LATICACIDVEN 1.53  --   --   --   PROCALCITON  --  0.30 0.10 <0.10    ABG No results for input(s): PHART, PCO2ART, PO2ART in the last 168 hours.  Liver Enzymes Recent Labs  Lab 07/02/17 1830  AST 35  ALT 21  ALKPHOS 190*  BILITOT 1.0  ALBUMIN 2.4*    Cardiac Enzymes No results for input(s): TROPONINI, PROBNP in the last 168 hours.  Glucose No results for input(s): GLUCAP in the last 168 hours.  Imaging Dg Chest Port 1 View  Result Date: 07/05/2017 CLINICAL DATA:  Cough for 1 week EXAM: PORTABLE CHEST 1 VIEW COMPARISON:  07/02/2017 FINDINGS: Cardiac shadow is stable. Stable interstitial changes are again identified throughout both lungs. Persistent bilateral infiltrates are seen although mildly improved from the prior study. No new focal infiltrate is noted. No effusion is seen. IMPRESSION: Slight improvement in previously seen multifocal infiltrates. Electronically Signed   By: Inez Catalina M.D.   On: 07/05/2017 10:13     STUDIES:  CT angio chest 3/05 >> diffuse opacities with  air bronchograms Serology 3/05 >> ESR 120, RF negative, ANCA negative, SSA/SSB negative, SCL 70 negative, ANA negative, Aldolase 18.9, ACE 20  CULTURES: Influenza 2/25 >> negative Respiratory viral panel 3/05 >> negative HIV 3/05 >> negative Legionella Ag 3/05 >> negative Pneumococcal Ag 3/05 >> negative HP panel :>> Negative  ANTIBIOTICS: Rocephin 3/05 >> 3/07 Zithromax 3/05 >> 3/07  DISCUSSION:  HP panel negative. Clinical improvement after starting steroids IV.  She has mild elevation in aldolase, ?  significance.  ASSESSMENT / PLAN:  Acute hypoxic respiratory failure with diffuse pulmonary infiltrates. CXR 3/8 >> Slight improvement in previously seen multifocal infiltrates. - HP panel negative  from 3/05 - continue oxygen to keep SpO2  > 92% - continue  IV solumedrol ,consider decreasing to 60 mg every 12 hours with continued improvement. - Will need to be transitioned  to po taper at discharge - Continue to follow CXR for improvement - procalcitonin negative >> Abx  D/c'd 3/7 - defer bronchoscopy for now - Follow up with pulmonary as outpatient. - Will need ambulatory saturation prior to discharge - Mobilize - Pulmonary Toilet/ IS  Magdalen Spatz, AGACNP-BC [place] 07/05/2017, 11:32 AM Pager:  [phone]  Attending Note:  57 year old female with PMH above presenting with what is likely a pneumonitis that is improving dramatically with steroids.  Patient is on RA at this point with stats between 95-100% but without ambulation with clear lungs on exam.  I reviewed CXR myself, infiltrate noted.  Discussed with PCCM-NP.  Pneumonitis:  - Change solumedrol 60 mg IV q12 for 2 doses then change to PO prednisone with a slow taper over a 10 day period  - F/U made with Tammy Parrett 3/29 with [place] PCCM  Hypoxemia:  - On RA at rest  - Need ambulatory desat study prior to discharge to see if will need O2 with ambulation  Pulmonary infiltrate:  - CXR prior to appointment with PCCM as outpatient  PCCM will sign off, please call back if needed.  Rush Farmer, M.D. [facility] Pulmonary/Critical Care Medicine. Pager: [phone]. After hours pager: [phone].

## 2017-07-05 NOTE — Discharge Instructions (Signed)

## 2017-07-05 NOTE — Discharge Summary (Signed)
Discharge Summary  Teresa Wells CZY:[id number] DOB: [birth year]  PCP: Leamon Arnt, MD  Admit date: 07/02/2017 Discharge date: 07/05/2017  Time spent: 25 minutes  Recommendations for Outpatient Follow-up:  1. Follow-up with pulmonology 2. Follow-up with PCP 3. Take your medications as prescribed  Discharge Diagnoses:  Active Hospital Problems   Diagnosis Date Noted  . Acute hypoxemic respiratory failure ([facility]) 07/02/2017  . Hypokalemia 07/02/2017  . Elevated LFTs 07/02/2017  . Adenopathy 10/03/2015    Resolved Hospital Problems  No resolved problems to display.    Discharge Condition: Stable  Diet recommendation: Resume previous diet  Vitals:   07/05/17 1235 07/05/17 1628  BP: 128/68 136/78  Pulse: 88 84  Resp: 18 18  Temp: 98 F (36.7 C) 98.2 F (36.8 C)  SpO2: 98% 98%    History of present illness:  Teresa Wells a 57 y.o.femalewithno significant pastmedical historypresenting with persistent respiratory failure despite recent antibiotics.She started having dental surgery done before Christmas. She went back to the dentist on 2/6 and they placed dental implants and she took 5 days of Z-pack. She flew to [place] the following morning and returned that evening and"from then on I have been sick."  Chest x-ray on admission revealed bilateral lower lobe infiltrates.  Respiratory viral panel negative.  Hypoxic requiring 5 L of oxygen to maintain O2 saturation greater than 90.  Patient admitted for acute hypoxic respiratory failure secondary to community-acquired pneumonia.  07/03/2017: Patient seen and examined with her husband at bedside.  Reports dyspnea is improved however is persistent.  She continues to be on 5 L and saturating 92%.  Cx-ray as reviewed by myself revealed bilateral pulmonary infiltrates suggestive of moderate to severe community-acquired pneumonia.  07/04/2017: Patient reports she feels much better today, dyspnea is improved. O2 supplementation  requirement is also improving. she denies any chest pain dyspnea at rest or palpitation.  Cough is also improving.  07/05/2017: Patient breathing comfortably on ambient air.  On the day of discharge patient was hemodynamically stable.  She will need to follow-up with pulmonology and PCP post hospitalization.  She was ambulating and eating without any difficulty.    Home O2 evaluation by RN Malen O2 at at rest 95-97% on RA while ambulating in the hallway 91-92% RA. Denies lightheadness or dyspnea.   Hospital Course:  Principal Problem:   Acute hypoxemic respiratory failure (HCC) Active Problems:   Adenopathy   Hypokalemia   Elevated LFTs  Acute hypoxic respiratory failure secondary to suspected bilateral lower lobe community-acquired pneumonia versus suspected interstitial lung disease - IV azithromycin and IV ceftriaxone stopped by pulmonology -Blood cultures and sputum cultures -duo nebs every 6 hours and every 2 hours as needed  hyper saline nebs chest physiotherapy -Pulmonology following.  Highly appreciated -prednisone changed to IV Solu-Medrol by pulmonology -Workup for interstitial lung disease negative thus far  Suspect that hypersensitivity pneumonitis versus other ILD -CCM following  Hypokalemia, resolved -Potassium 3.2 -Replete date with potassium chloride supplement and IV magnesium   Procedures: None  Consultations:  Pulmonology  Discharge Exam: BP 136/78 (BP Location: Left Arm)   Pulse 84   Temp 98.2 F (36.8 C) (Oral)   Resp 18   Ht 5\' 2"  (1.575 m)   Wt 82.6 kg (182 lb)   SpO2 98%   BMI 33.29 kg/m   General: 57 year old Caucasian female well-developed well-nourished in no acute distress oriented x3 Cardiovascular: Regular rate and rhythm with rubs or gallops Respiratory: Clear to auscultation with no wheezes  no rales  Discharge Instructions You were cared for by a hospitalist during your hospital stay. If you have any questions about your  discharge medications or the care you received while you were in the hospital after you are discharged, you can call the unit and asked to speak with the hospitalist on call if the hospitalist that took care of you is not available. Once you are discharged, your primary care physician will handle any further medical issues. Please note that NO REFILLS for any discharge medications will be authorized once you are discharged, as it is imperative that you return to your primary care physician (or establish a relationship with a primary care physician if you do not have one) for your aftercare needs so that they can reassess your need for medications and monitor your lab values.   Allergies as of 07/05/2017      Reactions   Oxycodone    Penicillins Hives   Sulfa Antibiotics Hives      Medication List    TAKE these medications   acetaminophen 500 MG tablet Commonly known as:  TYLENOL Take 1,000 mg by mouth every 6 (six) hours as needed for mild pain or moderate pain.   benzonatate 100 MG capsule Commonly known as:  TESSALON PERLES Take 1 capsule (100 mg total) by mouth every 6 (six) hours as needed for cough.   cetirizine 10 MG tablet Commonly known as:  ZYRTEC Take 10 mg by mouth daily. Reported on 10/03/2015   fluticasone 50 MCG/ACT nasal spray Commonly known as:  FLONASE Place 1 spray into both nostrils as needed for allergies.   ibuprofen 200 MG tablet Commonly known as:  ADVIL,MOTRIN Take 200 mg by mouth every 6 (six) hours as needed.   multivitamin tablet Take 1 tablet by mouth daily. Reported on 10/03/2015   nystatin 100000 UNIT/ML suspension Commonly known as:  MYCOSTATIN Take 5 mLs (500,000 Units total) by mouth 4 (four) times daily.   predniSONE 20 MG tablet Commonly known as:  DELTASONE Day one: 50 mg daily x 2 days Day two: 40 mg x 2 days Day three: 30 mg x 2 days Day four: 20 mg x 2 days Day five: 10 mg x 2 days   pseudoephedrine 30 MG tablet Commonly known as:   SUDAFED Take 30 mg by mouth as needed for congestion.   vitamin C 1000 MG tablet Take by mouth.   zolpidem 5 MG tablet Commonly known as:  AMBIEN zolpidem 5 mg tablet      Allergies  Allergen Reactions  . Oxycodone   . Penicillins Hives  . Sulfa Antibiotics Hives   Follow-up Information    Parrett, Fonnie Mu, NP Follow up on 07/26/2017.   Specialty:  Pulmonary Disease Why:  Your appointment is at 9:15AM Contact information: [address] [place] [address] [phone]        Leamon Arnt, MD Follow up in 3 day(s).   Specialty:  Family Medicine Contact information: [address] [place] [address] [phone]            The results of significant diagnostics from this hospitalization (including imaging, microbiology, ancillary and laboratory) are listed below for reference.    Significant Diagnostic Studies: Dg Chest 2 View  Result Date: 06/24/2017 CLINICAL DATA:  Cough, body aches and fever. EXAM: CHEST  2 VIEW COMPARISON:  Chest x-ray earlier today at [facility] FINDINGS: The heart size and mediastinal contours are within normal limits. Lungs  show diffuse bronchial thickening bilaterally and patchy airspace disease in both upper lung zones and the right perihilar region. Findings are suggestive of at least diffuse bronchitis and likely bilateral pneumonia. No overt pulmonary edema, pleural fluid, focal nodularity or pneumothorax identified. The visualized skeletal structures are unremarkable. IMPRESSION: Diffuse bronchial thickening and patchy airspace disease in the upper lobes bilaterally and right perihilar region. Findings are suggestive of diffuse bronchitis and likely bilateral pneumonia. Electronically Signed   By: Aletta Edouard M.D.   On: 06/24/2017 14:27   Ct Angio Chest Pe W/cm &/or Wo Cm  Result Date: 07/02/2017 CLINICAL DATA:  Pneumonia diagnosed 2 weeks ago. Patient states that she is feeling worse instead of getting  better. Persistent low-grade fever. EXAM: CT ANGIOGRAPHY CHEST WITH CONTRAST TECHNIQUE: Multidetector CT imaging of the chest was performed using the standard protocol during bolus administration of intravenous contrast. Multiplanar CT image reconstructions and MIPs were obtained to evaluate the vascular anatomy. CONTRAST:  70 cc ISOVUE-370 IOPAMIDOL (ISOVUE-370) INJECTION 76% COMPARISON:  06/24/2017 and 07/02/2017 CXR FINDINGS: Cardiovascular: Heart is top-normal in size. 1 no pericardial effusion. Minimal aortic atherosclerosis at the arch. No aneurysm or dissection. No acute pulmonary embolus. Normal branch pattern of the great vessels. Mediastinum/Nodes: Mildly enlarged prevascular and paratracheal lymph nodes. Bilateral hilar lymphadenopathy likely reactive, the largest is on the right measuring up to 16 mm short axis. Normal appearance of the thyroid gland without mass. Patent midline trachea and mainstem bronchi. Unremarkable CT appearance of the esophagus. Lungs/Pleura: Diffuse bilateral airspace opacities are seen throughout all lobes of both lungs. No effusion or pneumothorax is identified. No dominant mass is visualized. Given what appear to be areas of air bronchograms especially in the upper lobes, favor a postinfectious etiology as opposed to pulmonary edema or hemorrhage. Upper Abdomen: No acute abnormality. Musculoskeletal: No chest wall abnormality. No acute or significant osseous findings. Review of the MIP images confirms the above findings. IMPRESSION: 1. Diffuse bilateral multilobar airspace opacities are identified, some areas demonstrating air bronchograms. Findings favor a postinfectious etiology/pneumonia, less likely stigmata of CHF/pulmonary edema or pulmonary hemorrhage. 2. Enlarged bilateral hilar and mildly enlarged mediastinal lymph nodes likely reactive in the setting of bilateral airspace disease. 3. No acute pulmonary embolus. 4. Minimal aortic atherosclerosis. Aortic  Atherosclerosis (ICD10-I70.0). Electronically Signed   By: Ashley Royalty M.D.   On: 07/02/2017 14:11   Dg Chest Port 1 View  Result Date: 07/05/2017 CLINICAL DATA:  Cough for 1 week EXAM: PORTABLE CHEST 1 VIEW COMPARISON:  07/02/2017 FINDINGS: Cardiac shadow is stable. Stable interstitial changes are again identified throughout both lungs. Persistent bilateral infiltrates are seen although mildly improved from the prior study. No new focal infiltrate is noted. No effusion is seen. IMPRESSION: Slight improvement in previously seen multifocal infiltrates. Electronically Signed   By: Inez Catalina M.D.   On: 07/05/2017 10:13   Dg Chest Portable 1 View  Result Date: 07/02/2017 CLINICAL DATA:  57 year old female with shortness of breath for 5 days and productive cough. Diagnosed with pneumonia yesterday. EXAM: PORTABLE CHEST 1 VIEW COMPARISON:  06/24/2017 and earlier. FINDINGS: Portable AP upright view at 1042 hours. Chronic bilateral interstitial and reticulonodular pulmonary opacity. Superimposed new patchy right lung base opacity. No air bronchograms. No definite acute opacity in the left lung. No pleural effusion or pneumothorax. Mediastinal contours remain normal. Visualized tracheal air column is within normal limits. No acute osseous abnormality identified. Negative visible bowel gas pattern. IMPRESSION: 1. Patchy right lower lung opacity superimposed on chronic interstitial  lung disease is nonspecific but compatible with acute bronchopneumonia/pneumonia in this clinical setting. No pleural effusion is evident. 2. If pneumonia is suspected then follow-up PA and lateral chest X-ray is recommended in 3-4 weeks following trial of antibiotic therapy to ensure resolution and exclude underlying malignancy. Electronically Signed   By: Genevie Ann M.D.   On: 07/02/2017 11:00    Microbiology: Recent Results (from the past 240 hour(s))  Culture, blood (routine x 2) Call MD if unable to obtain prior to antibiotics being  given     Status: None (Preliminary result)   Collection Time: 07/02/17 10:38 AM  Result Value Ref Range Status   Specimen Description BLOOD LEFT ANTECUBITAL  Final   Special Requests   Final    BOTTLES DRAWN AEROBIC AND ANAEROBIC Blood Culture adequate volume   Culture   Final    NO GROWTH 3 DAYS Performed at [facility], [address] [place], [place] [address]    Report Status PENDING  Incomplete  Culture, blood (routine x 2) Call MD if unable to obtain prior to antibiotics being given     Status: None (Preliminary result)   Collection Time: 07/02/17 11:00 AM  Result Value Ref Range Status   Specimen Description BLOOD LEFT FOREARM  Final   Special Requests   Final    BOTTLES DRAWN AEROBIC AND ANAEROBIC Blood Culture adequate volume   Culture   Final    NO GROWTH 3 DAYS Performed at [facility], [place] [address], [place], [place] [address]    Report Status PENDING  Incomplete  Respiratory Panel by PCR     Status: None   Collection Time: 07/02/17  4:50 PM  Result Value Ref Range Status   Adenovirus NOT DETECTED NOT DETECTED Final   Coronavirus 229E NOT DETECTED NOT DETECTED Final   Coronavirus HKU1 NOT DETECTED NOT DETECTED Final   Coronavirus NL63 NOT DETECTED NOT DETECTED Final   Coronavirus OC43 NOT DETECTED NOT DETECTED Final   Metapneumovirus NOT DETECTED NOT DETECTED Final   Rhinovirus / Enterovirus NOT DETECTED NOT DETECTED Final   Influenza A NOT DETECTED NOT DETECTED Final   Influenza B NOT DETECTED NOT DETECTED Final   Parainfluenza Virus 1 NOT DETECTED NOT DETECTED Final   Parainfluenza Virus 2 NOT DETECTED NOT DETECTED Final   Parainfluenza Virus 3 NOT DETECTED NOT DETECTED Final   Parainfluenza Virus 4 NOT DETECTED NOT DETECTED Final   Respiratory Syncytial Virus NOT DETECTED NOT DETECTED Final   Bordetella pertussis NOT DETECTED NOT DETECTED Final   Chlamydophila pneumoniae NOT DETECTED NOT DETECTED Final   Mycoplasma pneumoniae NOT DETECTED NOT  DETECTED Final    Comment: Performed at [facility], [place] [address], [place], [place] [address]  MRSA PCR Screening     Status: None   Collection Time: 07/03/17  3:51 PM  Result Value Ref Range Status   MRSA by PCR NEGATIVE NEGATIVE Final    Comment:        The GeneXpert MRSA Assay (FDA approved for NASAL specimens only), is one component of a comprehensive MRSA colonization surveillance program. It is not intended to diagnose MRSA infection nor to guide or monitor treatment for MRSA infections. Performed at [facility], [place] [address] [place], [place] [address]   Culture, expectorated sputum-assessment     Status: None   Collection Time: 07/04/17  8:44 PM  Result Value Ref Range Status   Specimen Description EXPECTORATED SPUTUM  Final  Special Requests NONE  Final   Sputum evaluation   Final    THIS SPECIMEN IS ACCEPTABLE FOR SPUTUM CULTURE Performed at [facility], [place] [address], [place], [place] [address]    Report Status 07/05/2017 FINAL  Final  Culture, respiratory (NON-Expectorated)     Status: None (Preliminary result)   Collection Time: 07/04/17  8:44 PM  Result Value Ref Range Status   Specimen Description EXPECTORATED SPUTUM  Final   Special Requests NONE Reflexed from [id number]  Final   Gram Stain   Final    ABUNDANT WBC PRESENT,BOTH PMN AND MONONUCLEAR RARE GRAM POSITIVE COCCI RARE GRAM VARIABLE ROD    Culture   Final    TOO YOUNG TO READ Performed at [facility], [place] [address] [place], [place] [address]    Report Status PENDING  Incomplete     Labs: Basic Metabolic Panel: Recent Labs  Lab 07/02/17 1028 07/02/17 1830 07/03/17 0645 07/05/17 0333  NA 133*  --  140 143  K 2.7*  --  3.2* 4.5  CL 91*  --  100* 106  CO2 29  --  27 24  GLUCOSE 111*  --  101* 149*  BUN 6  --  <5* 5*  CREATININE 0.90  --  0.76 0.71  CALCIUM 7.9*  --  7.8* 8.7*  MG  --  2.4  --  2.1   Liver Function Tests: Recent Labs  Lab 07/02/17 1830   AST 35  ALT 21  ALKPHOS 190*  BILITOT 1.0  PROT 7.0  ALBUMIN 2.4*   No results for input(s): LIPASE, AMYLASE in the last 168 hours. No results for input(s): AMMONIA in the last 168 hours. CBC: Recent Labs  Lab 07/02/17 1028 07/03/17 0645 07/05/17 0942  WBC 21.7* 15.7* 14.9*  NEUTROABS 18.2* 12.2*  --   HGB 12.6 10.9* 12.0  HCT 37.7 34.1* 39.0  MCV 90.6 90.9 96.1  PLT 468* 407* 556*   Cardiac Enzymes: Recent Labs  Lab 07/02/17 1830  CKTOTAL 163   BNP: BNP (last 3 results) No results for input(s): BNP in the last 8760 hours.  ProBNP (last 3 results) No results for input(s): PROBNP in the last 8760 hours.  CBG: No results for input(s): GLUCAP in the last 168 hours.     Signed:  Kayleen Memos, MD [facility] 07/05/2017, 8:21 PM

## 2017-07-05 NOTE — Progress Notes (Addendum)
O2 at rest - 95-97% on RA. While ambulating in the hallway - 91-92% on RA. Denies any lightheadedness or SOB. Notified Dr. Nevada Crane.

## 2017-07-05 NOTE — Progress Notes (Signed)
Patient for d/c home with no apparent distress noted. Waiting for her husband to pick her up. Medications and discharge instructions explained to the patient. She verbalized understanding. Copies given to her including original prescription. IV saline lock removed by NAJonelle Sidle.

## 2017-07-05 NOTE — Care Management Note (Signed)
Case Management Note  Patient Details  Name: Teresa Wells MRN: [id number] Date of Birth: 1960/11/17  Subjective/Objective:                    Action/Plan: Pt discharging home with self care. Pt has PCP, insurance and transportation home. CM signing off.  Expected Discharge Date:  07/05/17               Expected Discharge Plan:  Home/Self Care  In-House Referral:     Discharge planning Services     Post Acute Care Choice:    Choice offered to:     DME Arranged:    DME Agency:     HH Arranged:    HH Agency:     Status of Service:  Completed, signed off  If discussed at [employer] of Stay Meetings, dates discussed:    Additional Comments:  Pollie Friar, RN 07/05/2017, 3:53 PM

## 2017-07-06 LAB — IGE: IgE (Immunoglobulin E), Serum: 15 IU/mL (ref 6–495)

## 2017-07-07 LAB — CULTURE, BLOOD (ROUTINE X 2)
CULTURE: NO GROWTH
Culture: NO GROWTH
SPECIAL REQUESTS: ADEQUATE
Special Requests: ADEQUATE

## 2017-07-07 LAB — CULTURE, RESPIRATORY: Culture: NORMAL

## 2017-07-07 LAB — CULTURE, RESPIRATORY W GRAM STAIN

## 2017-07-08 ENCOUNTER — Telehealth: Payer: Self-pay

## 2017-07-08 MED ORDER — ZOLPIDEM TARTRATE 5 MG PO TABS: 5.0000 mg | ORAL_TABLET | Freq: Every evening | ORAL | 0 refills | Status: DC | PRN

## 2017-07-08 NOTE — Telephone Encounter (Signed)
Patient requesting refill for Ambien 5 mg to [facility] in [place]. Next appt 07/10/17 for hospital f/u.

## 2017-07-08 NOTE — Telephone Encounter (Signed)
Refilled ambien to [facility]

## 2017-07-08 NOTE — Telephone Encounter (Signed)
Reviewed. Reviewed d/c summary. Agree.

## 2017-07-08 NOTE — Telephone Encounter (Signed)
Transition Care Management Follow-up Telephone Call  Admit date: 07/02/2017 Discharge date: 07/05/2017 Dx: Acute hypoxemic respiratory failure     How have you been since you were released from the hospital? "I'm fine"   Do you understand why you were in the hospital? yes, "bilateral pneumonia"   Do you understand the discharge instructions? yes   Where were you discharged to? Home. Lives with spouse.    Items Reviewed:  Medications reviewed: yes  Allergies reviewed: yes  Dietary changes reviewed: yes  Referrals reviewed: yes, f/u with pulmonology     Functional Questionnaire:   Activities of Daily Living (ADLs):   She states they are independent in the following: ambulation, bathing and hygiene, feeding, continence, grooming, toileting and dressing States they require assistance with the following: None.    Any transportation issues/concerns?: no   Any patient concerns? no   Confirmed importance and date/time of follow-up visits scheduled yes  Provider Appointment booked with PCP on 07/10/17.   Confirmed with patient if condition begins to worsen call PCP or go to the ER.  Patient was given the office number and encouraged to call back with question or concerns.  : yes

## 2017-07-10 ENCOUNTER — Encounter: Payer: Self-pay | Admitting: Emergency Medicine

## 2017-07-10 ENCOUNTER — Telehealth: Payer: Self-pay | Admitting: Family Medicine

## 2017-07-10 ENCOUNTER — Other Ambulatory Visit: Payer: Self-pay

## 2017-07-10 ENCOUNTER — Ambulatory Visit: Payer: 59 | Admitting: Family Medicine

## 2017-07-10 ENCOUNTER — Encounter: Payer: Self-pay | Admitting: Family Medicine

## 2017-07-10 VITALS — BP 120/84 | HR 92 | Temp 97.9°F | Ht 62.0 in | Wt 180.4 lb

## 2017-07-10 DIAGNOSIS — J189 Pneumonia, unspecified organism: Secondary | ICD-10-CM | POA: Diagnosis not present

## 2017-07-10 DIAGNOSIS — J9601 Acute respiratory failure with hypoxia: Secondary | ICD-10-CM

## 2017-07-10 DIAGNOSIS — E669 Obesity, unspecified: Secondary | ICD-10-CM | POA: Insufficient documentation

## 2017-07-10 DIAGNOSIS — D242 Benign neoplasm of left breast: Secondary | ICD-10-CM | POA: Insufficient documentation

## 2017-07-10 DIAGNOSIS — Z1231 Encounter for screening mammogram for malignant neoplasm of breast: Secondary | ICD-10-CM

## 2017-07-10 DIAGNOSIS — Z1239 Encounter for other screening for malignant neoplasm of breast: Secondary | ICD-10-CM

## 2017-07-10 MED ORDER — ZOLPIDEM TARTRATE 5 MG PO TABS: 5.0000 mg | ORAL_TABLET | Freq: Every evening | ORAL | 2 refills | Status: DC | PRN

## 2017-07-10 MED ORDER — GUAIFENESIN-CODEINE 100-10 MG/5ML PO SOLN: 5.0000 mL | Freq: Four times a day (QID) | ORAL | 0 refills | Status: DC | PRN

## 2017-07-10 NOTE — Telephone Encounter (Signed)
Pt and pharmacy called back in to follow up on request. Pharmacy says that the other Rx has been cancelled at the incorrect location. Pt would like to have Rx for Ambien sent in to pharmacy on file.

## 2017-07-10 NOTE — Telephone Encounter (Signed)
See notes. Patient needs refill for Ambien [facility] to [facility] [place]   AP, LPN

## 2017-07-10 NOTE — Progress Notes (Signed)
Subjective  CC:  Chief Complaint  Patient presents with  . Follow-up    Hospital Followup, Pneumonia, Patient complains of shortness of breath with exertion     HPI: Teresa Wells is a 57 y.o. female who presents to the office today to address the problems listed above in the chief complaint.  57 year old female here for follow-up visit after hospitalization for community-acquired pneumonia and acute hypoxic respiratory failure.  I reviewed her discharge summary and extensive lab work.  In brief, several week history of illness showing bilateral pneumonia with worsening hypoxia, admitted for pneumonia.  CT scan showed bilateral infiltrates and possible interstitial lung disease with hilar adenopathy.  Extensive lab evaluation was negative for infectious processes.  Patient improved after IV steroids.  She continues on a prednisone taper.  She is feeling better.  Energy remains low.  Breathing is stable although become short of breath with activity.  No more fevers, chills.  Still has persistent cough.  We did review her past medical history as she is a new patient to me.  No major health problems although does have history of insomnia using Ambien as needed.  She sees a gynecologist.  Due for her Pap smear and mammogram. I reviewed the patients updated PMH, FH, and SocHx.    Patient Active Problem List   Diagnosis Date Noted  . Obesity with body mass index 30 or greater 07/10/2017  . 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 Acute hypoxemic respiratory failure ([place]) 07/02/2017  . Insomnia 10/03/2015  . Seasonal allergies 05/29/2012   Current Meds  Medication Sig  . acetaminophen (TYLENOL) 500 MG tablet Take 1,000 mg by mouth every 6 (six) hours as needed for mild pain or moderate pain.   . Ascorbic Acid (VITAMIN C) 1000 MG tablet Take by mouth.  . cetirizine (ZYRTEC) 10 MG tablet Take 10 mg by mouth daily. Reported on 10/03/2015  . fluticasone (FLONASE) 50 MCG/ACT nasal spray Place 1  spray into both nostrils as needed for allergies.   Marland Kitchen ibuprofen (ADVIL,MOTRIN) 200 MG tablet Take 200 mg by mouth every 6 (six) hours as needed.  . predniSONE (DELTASONE) 20 MG tablet Day one: 50 mg daily x 2 days Day two: 40 mg x 2 days Day three: 30 mg x 2 days Day four: 20 mg x 2 days Day five: 10 mg x 2 days  . pseudoephedrine (SUDAFED) 30 MG tablet Take 30 mg by mouth as needed for congestion.   Marland Kitchen zolpidem (AMBIEN) 5 MG tablet Take 1 tablet (5 mg total) by mouth at bedtime as needed for sleep.  . [DISCONTINUED] Multiple Vitamin (MULTIVITAMIN) tablet Take 1 tablet by mouth daily. Reported on 10/03/2015    Allergies: Patient is allergic to oxycodone; penicillins; and sulfa antibiotics. Family History: Patient family history includes Diabetes in her father and mother; Heart disease in her brother, father, and mother; Hypertension in her brother, father, and mother; Stroke in her father. Social History:  Patient  reports that  has never smoked. she has never used smokeless tobacco. She reports that she does not drink alcohol or use drugs.  Review of Systems: Constitutional: Negative for fever malaise or anorexia Cardiovascular: negative for chest pain Respiratory: negative for SOB or persistent cough Gastrointestinal: negative for abdominal pain  Objective  Vitals: BP 120/84   Pulse 92   Temp 97.9 F (36.6 C)   Ht 5\' 2"  (1.575 m)   Wt 180 lb 6.4 oz (81.8 kg)   SpO2 95%  BMI 33.00 kg/m  General: no acute distress , A&Ox3, clear normal speech although does get breathless mildly.  Normal pulse ox. HEENT: PEERL, conjunctiva normal, Oropharynx moist,neck is supple Cardiovascular:  RRR without murmur or gallop.  Respiratory:  Good breath sounds bilaterally, mild rales appreciated, no wheezing, no rhonchi, no retractions  skin:  Warm, no rashes  Assessment  1. Community acquired pneumonia, unspecified laterality   2. Acute hypoxemic respiratory failure (HCC)   3. Breast cancer  screening      Plan   Hypoxic respiratory failure: Improved.  Continue steroid taper and has follow-up with pulmonology in 2 weeks.  We will continue workup for interstitial lung disease.  Will need follow-up imaging.  Patient instructed to call the office immediately if worsening breathing after stopping her steroids.  Community-acquired pneumonia status post antibiotics.  No further antibiotics warranted.  Health maintenance: Set up mammogram.  Follow-up with GYN for next Pap smear that is currently due.  May need Pneumovax immunization once respiratory symptoms have resolved.  Follow up: Return in about 8 weeks (around 09/04/2017) for recheck.    Commons side effects, risks, benefits, and alternatives for medications and treatment plan prescribed today were discussed, and the patient expressed understanding of the given instructions. Patient is instructed to call or message via MyChart if he/she has any questions or concerns regarding our treatment plan. No barriers to understanding were identified. We discussed Red Flag symptoms and signs in detail. Patient expressed understanding regarding what to do in case of urgent or emergency type symptoms.   Medication list was reconciled, printed and provided to the patient in AVS. Patient instructions and summary information was reviewed with the patient as documented in the AVS. This note was prepared with assistance of Dragon voice recognition software. Occasional wrong-word or sound-a-like substitutions may have occurred due to the inherent limitations of voice recognition software  Orders Placed This Encounter  Procedures  . MM DIGITAL SCREENING BILATERAL   Meds ordered this encounter  Medications  . guaiFENesin-codeine 100-10 MG/5ML syrup    Sig: Take 5 mLs by mouth every 6 (six) hours as needed for cough.    Dispense:  120 mL    Refill:  0

## 2017-07-10 NOTE — Telephone Encounter (Signed)
Copied from [place] [phone]. Topic: Quick Communication - Rx Refill/Question >> Jul 10, 2017  9:22 AM Scherrie Gerlach wrote: Medication: zolpidem (AMBIEN) 5 MG tablet   Has the patient contacted their pharmacy? Yes, this is the pharmacy calling to request this rx sent in yesterday be transferred to their location.  Pt has moved, and they cannot transfer this controlled med because this is a first time Rx too. [id number] - [place], [place] - [address] [phone] (Phone) [phone] (Fax)

## 2017-07-10 NOTE — Telephone Encounter (Signed)
Sent in RX for ambien to [facility] [place]

## 2017-07-10 NOTE — Patient Instructions (Signed)
Please return in 8 weeks for recheck.   If you have any questions or concerns, please don't hesitate to send me a message via MyChart or call the office at [phone]. Thank you for visiting with [place] today! It's our pleasure caring for you.  Follow up with pulmonology as scheduled.   See Dr. Butler Denmark for your female exam.   Call if your breathing worsens any when you stop the prednisone.

## 2017-07-10 NOTE — Progress Notes (Signed)
Dictation #1 [id number]  [id number]

## 2017-07-19 ENCOUNTER — Telehealth: Payer: Self-pay | Admitting: Family Medicine

## 2017-07-19 MED ORDER — GUAIFENESIN-CODEINE 100-10 MG/5ML PO SOLN: 5.0000 mL | Freq: Four times a day (QID) | ORAL | 0 refills | Status: DC | PRN

## 2017-07-19 NOTE — Telephone Encounter (Signed)
Copied from [place]. Topic: Quick Communication - Rx Refill/Question >> Jul 19, 2017  8:34 AM Ether Griffins B wrote: Medication: guaiFENesin-codeine 100-10 MG/5ML syrup  Pt is feeling better. But her cough is still constant at night and she is hoping to get a refill on the cough syrup. Pt states she still feels weak and is still at home. She think its getting better and follows up with pulmonary next week.   Preferred Pharmacy (with phone number or street name): [id number] - [place], [place] - [address] Agent: Please be advised that RX refills may take up to 3 business days. We ask that you follow-up with your pharmacy.

## 2017-07-26 ENCOUNTER — Ambulatory Visit: Payer: 59 | Admitting: Adult Health

## 2017-07-26 ENCOUNTER — Other Ambulatory Visit (INDEPENDENT_AMBULATORY_CARE_PROVIDER_SITE_OTHER): Payer: 59

## 2017-07-26 ENCOUNTER — Ambulatory Visit (INDEPENDENT_AMBULATORY_CARE_PROVIDER_SITE_OTHER)
Admission: RE | Admit: 2017-07-26 | Discharge: 2017-07-26 | Disposition: A | Discharge status: Home / Self Care | Payer: 59 | Source: Ambulatory Visit | Attending: Adult Health | Admitting: Adult Health

## 2017-07-26 ENCOUNTER — Encounter: Payer: Self-pay | Admitting: Adult Health

## 2017-07-26 VITALS — BP 122/74 | Ht 62.5 in | Wt 172.0 lb

## 2017-07-26 DIAGNOSIS — J189 Pneumonia, unspecified organism: Secondary | ICD-10-CM

## 2017-07-26 DIAGNOSIS — R0602 Shortness of breath: Secondary | ICD-10-CM | POA: Diagnosis not present

## 2017-07-26 DIAGNOSIS — J9611 Chronic respiratory failure with hypoxia: Secondary | ICD-10-CM

## 2017-07-26 DIAGNOSIS — J9601 Acute respiratory failure with hypoxia: Secondary | ICD-10-CM

## 2017-07-26 DIAGNOSIS — J984 Other disorders of lung: Secondary | ICD-10-CM

## 2017-07-26 HISTORY — DX: Chronic respiratory failure with hypoxia: J96.11

## 2017-07-26 LAB — BASIC METABOLIC PANEL
BUN: 5 mg/dL — AB (ref 6–23)
CO2: 27 mEq/L (ref 19–32)
CREATININE: 0.79 mg/dL (ref 0.40–1.20)
Calcium: 8.9 mg/dL (ref 8.4–10.5)
Chloride: 99 mEq/L (ref 96–112)
GFR: 79.25 mL/min (ref >60.00)
GLUCOSE: 137 mg/dL — AB (ref 70–99)
POTASSIUM: 3.8 meq/L (ref 3.5–5.1)
Sodium: 136 mEq/L (ref 135–145)

## 2017-07-26 LAB — SEDIMENTATION RATE: SED RATE: 53 mm/h — AB (ref 0–30)

## 2017-07-26 LAB — CBC WITH DIFFERENTIAL/PLATELET
BASOS PCT: 0.6 % (ref 0.0–3.0)
Basophils Absolute: 0.1 10*3/uL (ref 0.0–0.1)
EOS ABS: 0.7 10*3/uL (ref 0.0–0.7)
EOS PCT: 4.3 % (ref 0.0–5.0)
HCT: 40.8 % (ref 36.0–46.0)
Hemoglobin: 13.5 g/dL (ref 12.0–15.0)
Lymphocytes Relative: 10.7 % — ABNORMAL LOW (ref 12.0–46.0)
Lymphs Abs: 1.7 10*3/uL (ref 0.7–4.0)
MCHC: 33 g/dL (ref 30.0–36.0)
MCV: 89.7 fl (ref 78.0–100.0)
MONO ABS: 0.9 10*3/uL (ref 0.1–1.0)
Monocytes Relative: 5.4 % (ref 3.0–12.0)
NEUTROS PCT: 79 % — AB (ref 43.0–77.0)
Neutro Abs: 12.7 10*3/uL — ABNORMAL HIGH (ref 1.4–7.7)
Platelets: 322 10*3/uL (ref 150.0–400.0)
RBC: 4.55 Mil/uL (ref 3.87–5.11)
RDW: 15.1 % (ref 11.5–15.5)
WBC: 16.1 10*3/uL — ABNORMAL HIGH (ref 4.0–10.5)

## 2017-07-26 LAB — BRAIN NATRIURETIC PEPTIDE: Pro B Natriuretic peptide (BNP): 20 pg/mL (ref 0.0–100.0)

## 2017-07-26 MED ORDER — GUAIFENESIN-CODEINE 100-10 MG/5ML PO SOLN: 5.0000 mL | Freq: Four times a day (QID) | ORAL | 0 refills | Status: DC | PRN

## 2017-07-26 MED ORDER — PREDNISONE 10 MG PO TABS: ORAL_TABLET | ORAL | 0 refills | Status: DC

## 2017-07-26 NOTE — Progress Notes (Signed)
Called spoke with patient, advised of lab results / recs as stated by TP.  Pt verbalized her understanding and denied any questions. 

## 2017-07-26 NOTE — Assessment & Plan Note (Addendum)
Pneumonitis with recent acute flare , etiology unknown . Did occur after dental implants unclear if this is related . Workup unrevealing except for high ESR at 120. CT chest with widespread multifocal opacities . Seem to improve with steroid challenge and decline off steroids  Will re-challenge with steroids , check CXR  Will need CT chest in few weeks going forwards  check labs today with bnp and ESR If not improving will need to consider biopsy   Plan  Patient Instructions  Begin Prednisone 45m daily for 5 days then 337mdaily for 5 days then 2052maily . Hold at this dose .  Begin Oxygen 3l/m .  Order for POC  Chest xray today .  Labs today .  Follow-up in 2 weeks with  Dr. SooHalford Chessmanith PFT and As needed   Please contact office for sooner follow up if symptoms do not improve or worsen or seek emergency care

## 2017-07-26 NOTE — Progress Notes (Signed)
Reviewed and agree with assessment/plan.   Diem Dicocco, MD [facility] 04/25/2016, 12:24 PM Pager:  [phone]  

## 2017-07-26 NOTE — Patient Instructions (Addendum)
Begin Prednisone 40mg  daily for 5 days then 30mg  daily for 5 days then 20mg  daily . Hold at this dose .  Begin Oxygen 3l/m .  Order for POC  Chest xray today .  Labs today .  Follow-up in 2 weeks with  Dr. Halford Chessman  With PFT and As needed   Please contact office for sooner follow up if symptoms do not improve or worsen or seek emergency care

## 2017-07-26 NOTE — Assessment & Plan Note (Signed)
O2 demands are increased since off steroids . Now desating on room air  Will restart Oxygen at 3l/m .

## 2017-07-26 NOTE — Progress Notes (Signed)
Called spoke with patient, advised of cxr results / recs as stated by TP.  Pt verbalized her understanding and denied any questions. 

## 2017-07-26 NOTE — Addendum Note (Signed)
Addended by: Parke Poisson E on: 07/26/2017 02:27 PM   Modules accepted: Orders

## 2017-07-26 NOTE — Progress Notes (Signed)
_0  ID: Joesphine Bare, female    DOB: 04-15-1961, 57 y.o.   MRN: [id number]  Chief Complaint  Patient presents with  . Follow-up    PNA     Referring provider: Leamon Arnt, MD  HPI: 57 year old female never smoker seen for pulmonary consult during hospitalization March 2019 for bilateral pneumonia versus interstitial lung disease with hypoxic respiratory failure  STUDIES:  CT angio chest 3/05 >> diffuse opacities with air bronchograms Serology 3/05 >> ESR 120, RF negative, ANCA negative, SSA/SSB negative, SCL 70 negative, ANA negative, Aldolase 18.9, ACE 20, HSP panel neg   CULTURES: Influenza 2/25 >> negative Respiratory viral panel 3/05 >> negative HIV 3/05 >> negative Legionella Ag 3/05 >> negative Pneumococcal Ag 3/05 >> negative  07/26/2017 Follow up : PNA/?ILD /O2 RF  Patient returns for a post hospital follow-up.  Patient was seen for pulmonary consult during hospitalization in early March 2019.  Prior to admission patient had extensive dental work in February 2019 with dental implants.  Shortly after developed  cough  congestion and shortness of breath.  She was treated with Zpack during dental surgery . Feels like she got worse immediately after taking Zpack . Chest x-ray had been concerning for pneumonia and she was started on course of antibiotics with Avelox.  Symptoms did not improve and patient was referred to the emergency room for hypoxemia requiring up to 6 L of nasal cannula to keep oxygen above 90%.  CT chest showed diffuse pulmonary infiltrates sparing the bases with concern for hypersensitivity pneumonitis.  She was treated with aggressive IV antibiotics and nebulized bronchodilators along with steroids.  Autoimmune panel was negative including hypersensitivity pneumonitis panel.  Sedimentation rate was 120. She was started on IV steroids . Started to have clinical improvement . Blood culture and sputum cultures were negative.  Chest x-ray prior to  discharge showed improvement in multifocal infiltrates.  Discharged on prednisone taper. Since discharge patient is not feeling as good. Did well while on prednisone but as soon as she came off has  Patient was not discharged home on oxygen.  However on arrival to our office today O2 saturations are 86% on room air.  At rest.  Patient required 3 L of oxygen to keep O2 saturations greater than 90.  Walk test required 3 L of oxygen to keep O2 saturations above 90%. Patient denies chest pain, orthopnea , edema or fever.        Allergies  Allergen Reactions  . Oxycodone   . Penicillins Hives  . Sulfa Antibiotics Hives    Immunization History  Administered Date(s) Administered  . Influenza Split 01/28/2017  . Influenza-Unspecified 02/15/2014, 03/18/2015  . Tdap 11/05/2011    Past Medical History:  Diagnosis Date  . Acute meniscal tear of knee LEFT  . Allergy   . Insomnia     Tobacco History: Social History   Tobacco Use  Smoking Status Never Smoker  Smokeless Tobacco Never Used   Counseling given: Not Answered   Outpatient Encounter Medications as of 07/26/2017  Medication Sig  . acetaminophen (TYLENOL) 500 MG tablet Take 1,000 mg by mouth every 6 (six) hours as needed for mild pain or moderate pain.   . Ascorbic Acid (VITAMIN C) 1000 MG tablet Take by mouth.  . cetirizine (ZYRTEC) 10 MG tablet Take 10 mg by mouth daily. Reported on 10/03/2015  . fluticasone (FLONASE) 50 MCG/ACT nasal spray Place 1 spray into both nostrils as needed for allergies.   Marland Kitchen  guaiFENesin-codeine 100-10 MG/5ML syrup Take 5 mLs by mouth every 6 (six) hours as needed for cough.  Marland Kitchen ibuprofen (ADVIL,MOTRIN) 200 MG tablet Take 200 mg by mouth every 6 (six) hours as needed.  . pseudoephedrine (SUDAFED) 30 MG tablet Take 30 mg by mouth as needed for congestion.   Marland Kitchen zolpidem (AMBIEN) 5 MG tablet Take 1 tablet (5 mg total) by mouth at bedtime as needed for sleep.  . [DISCONTINUED] guaiFENesin-codeine 100-10  MG/5ML syrup Take 5 mLs by mouth every 6 (six) hours as needed for cough.  . predniSONE (DELTASONE) 10 MG tablet 28m daily for 5 days then 382mdaily for 5 days then 2047maily . Hold at this dose  . [DISCONTINUED] predniSONE (DELTASONE) 20 MG tablet Day one: 50 mg daily x 2 days Day two: 40 mg x 2 days Day three: 30 mg x 2 days Day four: 20 mg x 2 days Day five: 10 mg x 2 days (Patient not taking: Reported on 07/26/2017)   No facility-administered encounter medications on file as of 07/26/2017.      Review of Systems  Constitutional:   No  weight loss, night sweats,  Fevers, chills, +fatigue, or  lassitude.  HEENT:   No headaches,  Difficulty swallowing,  Tooth/dental problems, or  Sore throat,                No sneezing, itching, ear ache,  +nasal congestion, post nasal drip,   CV:  No chest pain,  Orthopnea, PND, swelling in lower extremities, anasarca, dizziness, palpitations, syncope.   GI  No heartburn, indigestion, abdominal pain, nausea, vomiting, diarrhea, change in bowel habits, loss of appetite, bloody stools.   Resp:    No chest wall deformity  Skin: no rash or lesions.  GU: no dysuria, change in color of urine, no urgency or frequency.  No flank pain, no hematuria   MS:  No joint pain or swelling.  No decreased range of motion.  No back pain.    Physical Exam  BP 122/74 (BP Location: Left Arm, Cuff Size: Normal)   Ht 5' 2.5" (1.588 m)   Wt 172 lb (78 kg)   SpO2 90%   BMI 30.96 kg/m   GEN: A/Ox3; pleasant , NAD, obese    HEENT:  [place]/AT,  EACs-clear, TMs-wnl, NOSE-clear, THROAT-clear, no lesions, no postnasal drip or exudate noted.   NECK:  Supple w/ fair ROM; no JVD; normal carotid impulses w/o bruits; no thyromegaly or nodules palpated; no lymphadenopathy.    RESP  Clear  P & A; w/o, wheezes/ rales/ or rhonchi. no accessory muscle use, no dullness to percussion  CARD:  RRR, no m/r/g, no peripheral edema, pulses intact, no cyanosis or clubbing.  GI:    Soft & nt; nml bowel sounds; no organomegaly or masses detected.   Musco: Warm bil, no deformities or joint swelling noted.   Neuro: alert, no focal deficits noted.    Skin: Warm, no lesions or rashes    Lab Results:  CBC    Component Value Date/Time   WBC 14.9 (H) 07/05/2017 0942   RBC 4.06 07/05/2017 0942   HGB 12.0 07/05/2017 0942   HCT 39.0 07/05/2017 0942   PLT 556 (H) 07/05/2017 0942   MCV 96.1 07/05/2017 0942   MCH 29.6 07/05/2017 0942   MCHC 30.8 07/05/2017 0942   RDW 14.9 07/05/2017 0942   LYMPHSABS 1.9 07/03/2017 0645   MONOABS 1.1 (H) 07/03/2017 0645   EOSABS 0.5 07/03/2017 0645  BASOSABS 0.0 07/03/2017 0645    BMET    Component Value Date/Time   NA 143 2017/07/15 0333   K 4.5 2017-07-15 0333   CL 106 July 15, 2017 0333   CO2 24 15-Jul-2017 0333   GLUCOSE 149 (H) 15-Jul-2017 0333   BUN 5 (L) 07/15/2017 0333   CREATININE 0.71 07/15/2017 0333   CREATININE 0.74 09/03/2013 1227   CALCIUM 8.7 (L) 2017-07-15 0333   GFRNONAA >60 07-15-2017 0333   GFRAA >60 15-Jul-2017 0333    BNP No results found for: BNP  ProBNP No results found for: PROBNP  Imaging: Dg Chest 2 View  Result Date: 07/26/2017 CLINICAL DATA:  Follow-up pneumonia EXAM: CHEST - 2 VIEW COMPARISON:  Jul 15, 2017 FINDINGS: Heart size remains normal. Aortic calcification again seen. Widespread patchy pulmonary infiltrates persist, with some of the patchy densities being more prominent than previously seen on the last exam, but not as abnormal as the study of 07/12/2017. No lobar consolidation or collapse. No effusions. Bony structures unremarkable. IMPRESSION: Persistent bilateral patchy pulmonary infiltrates, slightly worsened when compared to the study of 2017-07-15 but not as pronounced as the study of 2017/07/12. Electronically Signed   By: Nelson Chimes M.D.   On: 07/26/2017 10:35   Ct Angio Chest Pe W/cm &/or Wo Cm  Result Date: 07/12/2017 CLINICAL DATA:  Pneumonia diagnosed 2 weeks ago. Patient  states that she is feeling worse instead of getting better. Persistent low-grade fever. EXAM: CT ANGIOGRAPHY CHEST WITH CONTRAST TECHNIQUE: Multidetector CT imaging of the chest was performed using the standard protocol during bolus administration of intravenous contrast. Multiplanar CT image reconstructions and MIPs were obtained to evaluate the vascular anatomy. CONTRAST:  70 cc ISOVUE-370 IOPAMIDOL (ISOVUE-370) INJECTION 76% COMPARISON:  06/24/2017 and Jul 12, 2017 CXR FINDINGS: Cardiovascular: Heart is top-normal in size. 1 no pericardial effusion. Minimal aortic atherosclerosis at the arch. No aneurysm or dissection. No acute pulmonary embolus. Normal branch pattern of the great vessels. Mediastinum/Nodes: Mildly enlarged prevascular and paratracheal lymph nodes. Bilateral hilar lymphadenopathy likely reactive, the largest is on the right measuring up to 16 mm short axis. Normal appearance of the thyroid gland without mass. Patent midline trachea and mainstem bronchi. Unremarkable CT appearance of the esophagus. Lungs/Pleura: Diffuse bilateral airspace opacities are seen throughout all lobes of both lungs. No effusion or pneumothorax is identified. No dominant mass is visualized. Given what appear to be areas of air bronchograms especially in the upper lobes, favor a postinfectious etiology as opposed to pulmonary edema or hemorrhage. Upper Abdomen: No acute abnormality. Musculoskeletal: No chest wall abnormality. No acute or significant osseous findings. Review of the MIP images confirms the above findings. IMPRESSION: 1. Diffuse bilateral multilobar airspace opacities are identified, some areas demonstrating air bronchograms. Findings favor a postinfectious etiology/pneumonia, less likely stigmata of CHF/pulmonary edema or pulmonary hemorrhage. 2. Enlarged bilateral hilar and mildly enlarged mediastinal lymph nodes likely reactive in the setting of bilateral airspace disease. 3. No acute pulmonary embolus. 4.  Minimal aortic atherosclerosis. Aortic Atherosclerosis (ICD10-I70.0). Electronically Signed   By: Ashley Royalty M.D.   On: 07-12-17 14:11   Dg Chest Port 1 View  Result Date: 07-15-17 CLINICAL DATA:  Cough for 1 week EXAM: PORTABLE CHEST 1 VIEW COMPARISON:  Jul 12, 2017 FINDINGS: Cardiac shadow is stable. Stable interstitial changes are again identified throughout both lungs. Persistent bilateral infiltrates are seen although mildly improved from the prior study. No new focal infiltrate is noted. No effusion is seen. IMPRESSION: Slight improvement in previously seen multifocal infiltrates. Electronically Signed   By:  Inez Catalina M.D.   On: 07/05/2017 10:13   Dg Chest Portable 1 View  Result Date: 07/02/2017 CLINICAL DATA:  57 year old female with shortness of breath for 5 days and productive cough. Diagnosed with pneumonia yesterday. EXAM: PORTABLE CHEST 1 VIEW COMPARISON:  06/24/2017 and earlier. FINDINGS: Portable AP upright view at 1042 hours. Chronic bilateral interstitial and reticulonodular pulmonary opacity. Superimposed new patchy right lung base opacity. No air bronchograms. No definite acute opacity in the left lung. No pleural effusion or pneumothorax. Mediastinal contours remain normal. Visualized tracheal air column is within normal limits. No acute osseous abnormality identified. Negative visible bowel gas pattern. IMPRESSION: 1. Patchy right lower lung opacity superimposed on chronic interstitial lung disease is nonspecific but compatible with acute bronchopneumonia/pneumonia in this clinical setting. No pleural effusion is evident. 2. If pneumonia is suspected then follow-up PA and lateral chest X-ray is recommended in 3-4 weeks following trial of antibiotic therapy to ensure resolution and exclude underlying malignancy. Electronically Signed   By: Genevie Ann M.D.   On: 07/02/2017 11:00     Assessment & Plan:   Pneumonitis Pneumonitis with recent acute flare , etiology unknown . Did occur  after dental implants unclear if this is related . Workup unrevealing except for high ESR at 120. CT chest with widespread multifocal opacities . Seem to improve with steroid challenge and decline off steroids  Will re-challenge with steroids , check CXR  Will need CT chest in few weeks going forwards  check labs today with bnp and ESR If not improving will need to consider biopsy   Plan  Patient Instructions  Begin Prednisone 44m daily for 5 days then 331mdaily for 5 days then 2058maily . Hold at this dose .  Begin Oxygen 3l/m .  Order for POC  Chest xray today .  Labs today .  Follow-up in 6-8 weeks with Dr. SooHalford Chessmanith PFT and As needed   Please contact office for sooner follow up if symptoms do not improve or worsen or seek emergency care       Chronic respiratory failure with hypoxia [place] demands are increased since off steroids . Now desating on room air  Will restart Oxygen at 3l/m .       TamRexene EdisonP 07/26/2017

## 2017-07-29 DIAGNOSIS — J9611 Chronic respiratory failure with hypoxia: Secondary | ICD-10-CM | POA: Diagnosis not present

## 2017-07-29 DIAGNOSIS — J189 Pneumonia, unspecified organism: Secondary | ICD-10-CM | POA: Diagnosis not present

## 2017-07-29 DIAGNOSIS — R0602 Shortness of breath: Secondary | ICD-10-CM | POA: Diagnosis not present

## 2017-08-05 DIAGNOSIS — D1801 Hemangioma of skin and subcutaneous tissue: Secondary | ICD-10-CM | POA: Diagnosis not present

## 2017-08-05 DIAGNOSIS — L821 Other seborrheic keratosis: Secondary | ICD-10-CM | POA: Diagnosis not present

## 2017-08-05 DIAGNOSIS — D225 Melanocytic nevi of trunk: Secondary | ICD-10-CM | POA: Diagnosis not present

## 2017-08-06 DIAGNOSIS — Z1231 Encounter for screening mammogram for malignant neoplasm of breast: Secondary | ICD-10-CM | POA: Diagnosis not present

## 2017-08-06 LAB — HM MAMMOGRAPHY

## 2017-08-14 ENCOUNTER — Encounter: Payer: Self-pay | Admitting: Adult Health

## 2017-08-14 ENCOUNTER — Ambulatory Visit (INDEPENDENT_AMBULATORY_CARE_PROVIDER_SITE_OTHER)
Admission: RE | Admit: 2017-08-14 | Discharge: 2017-08-14 | Disposition: A | Discharge status: Home / Self Care | Payer: 59 | Source: Ambulatory Visit | Attending: Adult Health | Admitting: Adult Health

## 2017-08-14 ENCOUNTER — Encounter: Payer: Self-pay | Admitting: Emergency Medicine

## 2017-08-14 ENCOUNTER — Other Ambulatory Visit: Payer: Self-pay | Admitting: Adult Health

## 2017-08-14 ENCOUNTER — Ambulatory Visit (INDEPENDENT_AMBULATORY_CARE_PROVIDER_SITE_OTHER): Payer: 59 | Admitting: Pulmonary Disease

## 2017-08-14 ENCOUNTER — Ambulatory Visit: Payer: 59 | Admitting: Adult Health

## 2017-08-14 VITALS — BP 142/84 | HR 98 | Ht 63.0 in | Wt 182.0 lb

## 2017-08-14 DIAGNOSIS — J9601 Acute respiratory failure with hypoxia: Secondary | ICD-10-CM

## 2017-08-14 DIAGNOSIS — R0602 Shortness of breath: Secondary | ICD-10-CM

## 2017-08-14 DIAGNOSIS — J189 Pneumonia, unspecified organism: Secondary | ICD-10-CM | POA: Diagnosis not present

## 2017-08-14 DIAGNOSIS — J181 Lobar pneumonia, unspecified organism: Secondary | ICD-10-CM

## 2017-08-14 DIAGNOSIS — J9611 Chronic respiratory failure with hypoxia: Secondary | ICD-10-CM | POA: Diagnosis not present

## 2017-08-14 LAB — PULMONARY FUNCTION TEST
DL/VA % PRED: 101 %
DL/VA: 4.75 ml/min/mmHg/L
DLCO COR: 17.93 ml/min/mmHg
DLCO cor % pred: 78 %
DLCO unc % pred: 78 %
DLCO unc: 17.99 ml/min/mmHg
FEF 25-75 Post: 3.2 L/sec
FEF 25-75 Pre: 2.25 L/sec
FEF2575-%CHANGE-POST: 42 %
FEF2575-%Pred-Post: 136 %
FEF2575-%Pred-Pre: 95 %
FEV1-%CHANGE-POST: 14 %
FEV1-%Pred-Post: 82 %
FEV1-%Pred-Pre: 72 %
FEV1-Post: 2.06 L
FEV1-Pre: 1.8 L
FEV1FVC-%CHANGE-POST: 3 %
FEV1FVC-%Pred-Pre: 108 %
FEV6-%Change-Post: 10 %
FEV6-%PRED-PRE: 68 %
FEV6-%Pred-Post: 75 %
FEV6-PRE: 2.13 L
FEV6-Post: 2.35 L
FEV6FVC-%PRED-PRE: 103 %
FEV6FVC-%Pred-Post: 103 %
FVC-%CHANGE-POST: 10 %
FVC-%PRED-POST: 72 %
FVC-%PRED-PRE: 66 %
FVC-POST: 2.35 L
FVC-Pre: 2.13 L
POST FEV1/FVC RATIO: 88 %
PRE FEV6/FVC RATIO: 100 %
Post FEV6/FVC ratio: 100 %
Pre FEV1/FVC ratio: 85 %
RV % PRED: 122 %
RV: 2.34 L
TLC % pred: 93 %
TLC: 4.57 L

## 2017-08-14 MED ORDER — ALBUTEROL SULFATE HFA 108 (90 BASE) MCG/ACT IN AERS: 2.0000 | INHALATION_SPRAY | RESPIRATORY_TRACT | 5 refills | Status: DC | PRN

## 2017-08-14 NOTE — Progress Notes (Signed)
$'@Patient'V$  ID: Teresa Wells, female    DOB: Mar 06, 1961, 58 y.o.   MRN: [id number]  No chief complaint on file.   Referring provider: Leamon Arnt, MD  HPI: 57 year old female never smoker seen for pulmonary consult during hospitalization March 2019 for bilateral pneumonia versus interstitial lung disease with hypoxic respiratory failure  STUDIES: CT angio chest 3/05 >> diffuse opacities with air bronchograms Serology 3/05 >> ESR 120, RF negative, ANCA negative, SSA/SSB negative, SCL 70 negative, ANA negative, Aldolase 18.9, ACE 20, HSP panel neg   CULTURES: Influenza 2/25 >> negative Respiratory viral panel 3/05 >> negative HIV 3/05 >> negative Legionella Ag 3/05 >> negative Pneumococcal Ag 3/05 >> negative   Follow up : PNA/ILD /O2 RF Patient returns for 2-week follow-up.  Patient was seen last visit for a post hospital follow-up.  Patient had a hospitalization in March 2019 with acute respiratory failure with with possible bilateral pneumonia with pulmonary infiltrates.  She had extensive dental work in February 2019 with dental implants.  Early after she became ill with cough and congestion.  She was treated for possible pneumonia with a Z-Pak.  She did not have any improvement.  She was started on Avelox.  She continued to have worsening of symptoms.  She was admitted with acute hypoxemia.  And required high flow oxygen.  CT chest showed diffuse pulmonary infiltrates sparing the bases concern for hypersensitive pneumonitis.  Autoimmune including hypersensitivity pneumonia panel was negative.  Improved with steroids.  Patient was seen last visit had improved shortly after discharge however after coming off of prednisone symptoms started to return.  She required oxygen was started back on oxygen at 3 L.  Patient says she started to feel significantly improved after starting on prednisone.  Patient says that she has been checking her oxygen at home and over the last week she has not  required any oxygen.  O2 saturations have remained in the 90s.  She says her cough and shortness of breath have both decreased substantially.  She says she is actually been able to return back to work this past week.  Does wear out easily with fatigue.  But says that her energy level is improving each day.  She denies any fever, hemoptysis, chest pain, orthopnea, edema.  Chest x-ray last visit showed persistent bilateral infiltrates possibly worse. She has tapered her prednisone is currently on prednisone 20 mg.. PFTs done today show mild lung restriction with FEV1 at 82%, ratio 88, FVC 72% positive bronchodilator response, DLCO 78%. Patient denies any wheezing.  No previous diagnosis of asthma.  Allergies  Allergen Reactions  . Oxycodone   . Penicillins Hives  . Sulfa Antibiotics Hives    Immunization History  Administered Date(s) Administered  . Influenza Split 01/28/2017  . Influenza-Unspecified 02/15/2014, 03/18/2015  . Tdap 11/05/2011    Past Medical History:  Diagnosis Date  . Acute meniscal tear of knee LEFT  . Allergy   . Insomnia     Tobacco History: Social History   Tobacco Use  Smoking Status Never Smoker  Smokeless Tobacco Never Used   Counseling given: Not Answered   Outpatient Encounter Medications as of 08/14/2017  Medication Sig  . Ascorbic Acid (VITAMIN C) 1000 MG tablet Take by mouth.  . cetirizine (ZYRTEC) 10 MG tablet Take 10 mg by mouth daily. Reported on 10/03/2015  . fluticasone (FLONASE) 50 MCG/ACT nasal spray Place 1 spray into both nostrils as needed for allergies.   Marland Kitchen guaiFENesin-codeine 100-10 MG/5ML syrup Take  5 mLs by mouth every 6 (six) hours as needed for cough.  . predniSONE (DELTASONE) 5 MG tablet 4 TABS DAILY  . pseudoephedrine (SUDAFED) 30 MG tablet Take 30 mg by mouth as needed for congestion.   Marland Kitchen zolpidem (AMBIEN) 5 MG tablet Take 1 tablet (5 mg total) by mouth at bedtime as needed for sleep.  Marland Kitchen albuterol (PROAIR HFA) 108 (90 Base)  MCG/ACT inhaler Inhale 2 puffs into the lungs every 4 (four) hours as needed for wheezing or shortness of breath.  . [DISCONTINUED] acetaminophen (TYLENOL) 500 MG tablet Take 1,000 mg by mouth every 6 (six) hours as needed for mild pain or moderate pain.   . [DISCONTINUED] ibuprofen (ADVIL,MOTRIN) 200 MG tablet Take 200 mg by mouth every 6 (six) hours as needed.   No facility-administered encounter medications on file as of 08/14/2017.      Review of Systems  Constitutional:   No  weight loss, night sweats,  Fevers, chills, fatigue, or  lassitude.  HEENT:   No headaches,  Difficulty swallowing,  Tooth/dental problems, or  Sore throat,                No sneezing, itching, ear ache, nasal congestion, post nasal drip,   CV:  No chest pain,  Orthopnea, PND, swelling in lower extremities, anasarca, dizziness, palpitations, syncope.   GI  No heartburn, indigestion, abdominal pain, nausea, vomiting, diarrhea, change in bowel habits, loss of appetite, bloody stools.   Resp: No shortness of breath with exertion or at rest.  No excess mucus, no productive cough,  No non-productive cough,  No coughing up of blood.  No change in color of mucus.  No wheezing.  No chest wall deformity  Skin: no rash or lesions.  GU: no dysuria, change in color of urine, no urgency or frequency.  No flank pain, no hematuria   MS:  No joint pain or swelling.  No decreased range of motion.  No back pain.    Physical Exam  BP (!) 142/84 (BP Location: Left Arm, Cuff Size: Normal)   Pulse 98   Ht '5\' 3"'$  (1.6 m)   Wt 182 lb (82.6 kg)   SpO2 96%   BMI 32.24 kg/m   GEN: A/Ox3; pleasant , NAD, well nourished    HEENT:  [place]/AT,  EACs-clear, TMs-wnl, NOSE-clear, THROAT-clear, no lesions, no postnasal drip or exudate noted.   NECK:  Supple w/ fair ROM; no JVD; normal carotid impulses w/o bruits; no thyromegaly or nodules palpated; no lymphadenopathy.    RESP  Clear  P & A; w/o, wheezes/ rales/ or rhonchi. no accessory  muscle use, no dullness to percussion  CARD:  RRR, no m/r/g, no peripheral edema, pulses intact, no cyanosis or clubbing.  GI:   Soft & nt; nml bowel sounds; no organomegaly or masses detected.   Musco: Warm bil, no deformities or joint swelling noted.   Neuro: alert, no focal deficits noted.    Skin: Warm, no lesions or rashes    Lab Results:  CBC    Component Value Date/Time   WBC 16.1 (H) 07/26/2017 1013   RBC 4.55 07/26/2017 1013   HGB 13.5 07/26/2017 1013   HCT 40.8 07/26/2017 1013   PLT 322.0 07/26/2017 1013   MCV 89.7 07/26/2017 1013   MCH 29.6 07/05/2017 0942   MCHC 33.0 07/26/2017 1013   RDW 15.1 07/26/2017 1013   LYMPHSABS 1.7 07/26/2017 1013   MONOABS 0.9 07/26/2017 1013   EOSABS 0.7 07/26/2017 1013  BASOSABS 0.1 07/26/2017 1013    BMET    Component Value Date/Time   NA 136 07/26/2017 1013   K 3.8 07/26/2017 1013   CL 99 07/26/2017 1013   CO2 27 07/26/2017 1013   GLUCOSE 137 (H) 07/26/2017 1013   BUN 5 (L) 07/26/2017 1013   CREATININE 0.79 07/26/2017 1013   CREATININE 0.74 09/03/2013 1227   CALCIUM 8.9 07/26/2017 1013   GFRNONAA >60 July 26, 2017 0333   GFRAA >60 07/26/2017 0333    BNP No results found for: BNP  ProBNP    Component Value Date/Time   PROBNP 20.0 07/26/2017 1013    Imaging: Dg Chest 2 View  Result Date: 07/26/2017 CLINICAL DATA:  Follow-up pneumonia EXAM: CHEST - 2 VIEW COMPARISON:  07-26-17 FINDINGS: Heart size remains normal. Aortic calcification again seen. Widespread patchy pulmonary infiltrates persist, with some of the patchy densities being more prominent than previously seen on the last exam, but not as abnormal as the study of 2017-07-23. No lobar consolidation or collapse. No effusions. Bony structures unremarkable. IMPRESSION: Persistent bilateral patchy pulmonary infiltrates, slightly worsened when compared to the study of July 26, 2017 but not as pronounced as the study of 2017-07-23. Electronically Signed   By: Nelson Chimes M.D.   On: 07/26/2017 10:35   Hm Mammography  Needs Additional Imaging, Patient will be contacted to schedule additional imaging. Amy Lucilla Lame,  LPN     Assessment & Plan:   Chronic respiratory failure with hypoxia ([place]) Resolved.  Patient may discontinue her oxygen use  Pneumonitis Possible pneumonitis with bilateral pulmonary infiltrates that are steroid responsive question about etiology.  Autoimmune work-up is negative except for an elevated sed rate. Patient is improving on steroids.  Will slowly taper. Check chest x-ray today and a follow-up CT chest in 4 weeks on return. PFTs show mild restriction.  Possibly an asthmatic or reactive airways component.  She has reversibility on her PFTs.  We will add an pro-air to be used as needed.  She has no active symptoms.  Plan  Patient Instructions  Continue on Prednisone '20mg'$  daily for 2 weeks and '10mg'$ .daily, hold at this dose.  ProAir 2 puffs every 4hr as needed. Wheezing .  Discontinue Oxygen .  Activity as tolerated.  CT chest in 4 weeks .  Follow-up in 4 weeks with  Dr. Halford Chessman  and As needed   Please contact office for sooner follow up if symptoms do not improve or worsen or seek emergency care       Seasonal allergies Continue on trigger prevention. Patient has some reversibility on her PFTs.  Adding Pro Air as needed for possible asthma component.     Rexene Edison, NP 08/14/2017

## 2017-08-14 NOTE — Assessment & Plan Note (Signed)
Resolved.  Patient may discontinue her oxygen use

## 2017-08-14 NOTE — Progress Notes (Signed)
Reviewed and agree with assessment/plan.   Shae Hinnenkamp, MD [facility] 04/25/2016, 12:24 PM Pager:  [phone]  

## 2017-08-14 NOTE — Assessment & Plan Note (Signed)
Continue on trigger prevention. Patient has some reversibility on her PFTs.  Adding Pro Air as needed for possible asthma component.

## 2017-08-14 NOTE — Patient Instructions (Signed)
Continue on Prednisone 20mg  daily for 2 weeks and 10mg .daily, hold at this dose.  ProAir 2 puffs every 4hr as needed. Wheezing .  Discontinue Oxygen .  Activity as tolerated.  CT chest in 4 weeks .  Follow-up in 4 weeks with  Dr. Halford Chessman  and As needed   Please contact office for sooner follow up if symptoms do not improve or worsen or seek emergency care

## 2017-08-14 NOTE — Progress Notes (Signed)
PFT done today. 

## 2017-08-14 NOTE — Assessment & Plan Note (Addendum)
Possible pneumonitis with bilateral pulmonary infiltrates that are steroid responsive question about etiology.  Autoimmune work-up is negative except for an elevated sed rate. Patient is improving on steroids.  Will slowly taper. Check chest x-ray today and a follow-up CT chest in 4 weeks on return. PFTs show mild restriction.  Possibly an asthmatic or reactive airways component.  She has reversibility on her PFTs.  We will add an pro-air to be used as needed.  She has no active symptoms.  Plan  Patient Instructions  Continue on Prednisone 20mg  daily for 2 weeks and 10mg .daily, hold at this dose.  ProAir 2 puffs every 4hr as needed. Wheezing .  Discontinue Oxygen .  Activity as tolerated.  CT chest in 4 weeks .  Follow-up in 4 weeks with  Dr. Halford Chessman  and As needed   Please contact office for sooner follow up if symptoms do not improve or worsen or seek emergency care

## 2017-08-15 ENCOUNTER — Telehealth: Payer: Self-pay | Admitting: Adult Health

## 2017-08-15 NOTE — Telephone Encounter (Signed)
Called and spoke to pt.  Pt is requesting CXR from 08/15/17. Pt also wanted to clarify that she will continue at prednisone 10mg  until her next OV  (10/02/17). I have made pt aware that per 08/15/17 instructions, it appears that she would continue 10mg  until next OV. Pt is concerned about being on prednisone until June.  TP please advise. Thanks    Patient Instructions  Continue on Prednisone 20mg  daily for 2 weeks and 10mg .daily, hold at this dose.  ProAir 2 puffs every 4hr as needed. Wheezing .  Discontinue Oxygen .  Activity as tolerated.  CT chest in 4 weeks .  Follow-up in 4 weeks with  Dr. Halford Chessman  and As needed   Please contact office for sooner follow up if symptoms do not improve or worsen or seek emergency care

## 2017-08-15 NOTE — Telephone Encounter (Signed)
Please look at cxr results

## 2017-08-15 NOTE — Telephone Encounter (Signed)
LVM for patient to return call regarding results of cxr and pred taper concerns. X1  Results of CXR; recommendations from TP    Lung are clear , bilateral infiltrates are resolved  Can hold off on CT chest in 4 weeks  Taper Prednisone 20mg  daily for 2 weeks then 10mg  daily for 2 weeks and 5mg  daily for 1 week and then stop  Follow up with Dr. Halford Chessman In 6 weeks as planned  Please contact office for sooner follow up if symptoms do not improve or worsen or seek emergency care

## 2017-08-19 NOTE — Telephone Encounter (Signed)
Spoke with pt. States that she had already spoken with Tammy about these results. Her CT still needs to be canceled. I have called CT and left a message to have this canceled. Nothing further was needed.

## 2017-08-28 ENCOUNTER — Telehealth: Payer: Self-pay | Admitting: Adult Health

## 2017-08-28 NOTE — Telephone Encounter (Signed)
Called pt and advised message from the provider. Pt understood and verbalized understanding. Nothing further is needed.    

## 2017-08-28 NOTE — Telephone Encounter (Signed)
Pt would like to taper off of her prednisone- states that she is currently taking 20mg  daily and is having difficult side effects- jittery, hair loss, increased appetite, facial breakouts.  Pt currently on a 5 week long taper but wishes to quit this medication as soon as safely possible.  Denies any current breathing complaints.  Copied from cxr results outlining pred taper:   Results of CXR; recommendations from TP    Lung are clear , bilateral infiltrates are resolved  Can hold off on CT chest in 4 weeks  Taper Prednisone 20mg  daily for 2 weeks then 10mg  daily for 2 weeks and 5mg  daily for 1 week and then stop  Follow up with Dr. Halford Chessman In 6 weeks as planned  Please contact office for sooner follow up if symptoms do not improve or worsen or seek emergency care    TP please advise if prednisone can be tapered shorter at pt request.  Thanks!

## 2017-08-28 NOTE — Telephone Encounter (Signed)
Can try to take 20mg  daily for 3 days then 10mg  daily for 3 days , then 5mg  daily for 3 days and stop .  Please contact office for sooner follow up if symptoms do not improve or worsen or seek emergency care

## 2017-09-03 ENCOUNTER — Other Ambulatory Visit: Payer: Self-pay

## 2017-09-03 ENCOUNTER — Encounter: Payer: Self-pay | Admitting: Family Medicine

## 2017-09-03 ENCOUNTER — Ambulatory Visit: Payer: 59 | Admitting: Family Medicine

## 2017-09-03 VITALS — BP 128/82 | HR 76 | Temp 98.1°F | Resp 16 | Ht 62.5 in | Wt 185.6 lb

## 2017-09-03 DIAGNOSIS — J189 Pneumonia, unspecified organism: Secondary | ICD-10-CM | POA: Diagnosis not present

## 2017-09-03 DIAGNOSIS — E669 Obesity, unspecified: Secondary | ICD-10-CM | POA: Diagnosis not present

## 2017-09-03 NOTE — Progress Notes (Signed)
Subjective  CC:  Chief Complaint  Patient presents with  . Pneumonia    Recheck, patient is feeling much better    HPI: Teresa Wells is a 57 y.o. female who presents to the office today to address the problems listed above in the chief complaint.  F/u pulmonary disease/pneumonitis s/p ARF: doing well! reivewed pulm notes. Completed a 2 month course of pred and doing fine. Last day was yesterday. Energy level a little low but otherwise feels well. Not needing inhalers. Has f/u next month with pulm.   Would like to see a weight loss specialist with her husband who has already been referred. Has gained some weight due to the prednisone.   Will set up appt with gyn for pap  Recommend pneumovax.  I reviewed the patients updated PMH, FH, and SocHx.    Patient Active Problem List   Diagnosis Date Noted  . Pneumonitis 07/26/2017  . Chronic respiratory failure with hypoxia ([place]) 07/26/2017  . Obesity with body mass index 30 or greater 07/10/2017  . 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 Acute hypoxemic respiratory failure ([place]) 07/02/2017  . Insomnia 10/03/2015  . Seasonal allergies 05/29/2012   Current Meds  Medication Sig  . Ascorbic Acid (VITAMIN C) 1000 MG tablet Take by mouth.  . cetirizine (ZYRTEC) 10 MG tablet Take 10 mg by mouth daily. Reported on 10/03/2015  . fluticasone (FLONASE) 50 MCG/ACT nasal spray Place 1 spray into both nostrils as needed for allergies.   Marland Kitchen MULTIPLE VITAMIN PO Take 1 capsule by mouth daily. For skin, hair, and nails  . pseudoephedrine (SUDAFED) 30 MG tablet Take 30 mg by mouth as needed for congestion.   Marland Kitchen zolpidem (AMBIEN) 5 MG tablet Take 1 tablet (5 mg total) by mouth at bedtime as needed for sleep.    Allergies: Patient is allergic to sulfasalazine; oxycodone; penicillins; and sulfa antibiotics. Family History: Patient family history includes Diabetes in her father and mother; Heart disease in her brother, father, and mother;  Hypertension in her brother, father, and mother; Stroke in her father. Social History:  Patient  reports that she has never smoked. She has never used smokeless tobacco. She reports that she does not drink alcohol or use drugs.  Review of Systems: Constitutional: Negative for fever malaise or anorexia Cardiovascular: negative for chest pain Respiratory: negative for SOB or persistent cough Gastrointestinal: negative for abdominal pain  Objective  Vitals: BP 128/82   Pulse 76   Temp 98.1 F (36.7 C) (Oral)   Resp 16   Ht 5' 2.5" (1.588 m)   Wt 185 lb 9.6 oz (84.2 kg)   SpO2 97%   BMI 33.41 kg/m  General: no acute distress , A&Ox3 HEENT: PEERL, conjunctiva normal, Oropharynx moist,neck is supple Cardiovascular:  RRR without murmur or gallop.  Respiratory:  Good breath sounds bilaterally, CTAB with normal respiratory effort Skin:  Warm, no rashes  Assessment  1. Pneumonitis   2. Obesity with body mass index 30 or greater      Plan   pulm:  Now stable but just stopped pred. To watch for relapse. Has f/u scheduled. rec pneumovax at pulm f/u if agreeable  Refer for medical weight loss.   Follow up: Return in about 6 months (around 03/06/2018) for complete physical.    Commons side effects, risks, benefits, and alternatives for medications and treatment plan prescribed today were discussed, and the patient expressed understanding of the given instructions. Patient is instructed to call or message  via MyChart if he/she has any questions or concerns regarding our treatment plan. No barriers to understanding were identified. We discussed Red Flag symptoms and signs in detail. Patient expressed understanding regarding what to do in case of urgent or emergency type symptoms.   Medication list was reconciled, printed and provided to the patient in AVS. Patient instructions and summary information was reviewed with the patient as documented in the AVS. This note was prepared with  assistance of Dragon voice recognition software. Occasional wrong-word or sound-a-like substitutions may have occurred due to the inherent limitations of voice recognition software  Orders Placed This Encounter  Procedures  . Amb Ref to Medical Weight Management   No orders of the defined types were placed in this encounter.

## 2017-09-03 NOTE — Patient Instructions (Signed)
Please return in 6 months for your annual complete physical; please come fasting.   We will call you with information regarding your referral appointment. [facility] weight [facility]  If you have any questions or concerns, please don't hesitate to send me a message via MyChart or call the office at [phone]. Thank you for visiting with [place] today! It's our pleasure caring for you.  Keep an eye on your breathing and follow up with pulmonology as scheduled.

## 2017-09-12 ENCOUNTER — Other Ambulatory Visit: Payer: Self-pay | Admitting: Adult Health

## 2017-09-16 ENCOUNTER — Other Ambulatory Visit: Payer: 59

## 2017-09-17 ENCOUNTER — Encounter (INDEPENDENT_AMBULATORY_CARE_PROVIDER_SITE_OTHER): Payer: Self-pay

## 2017-10-02 ENCOUNTER — Ambulatory Visit: Payer: 59 | Admitting: Pulmonary Disease

## 2017-10-03 ENCOUNTER — Encounter (INDEPENDENT_AMBULATORY_CARE_PROVIDER_SITE_OTHER): Payer: Self-pay | Admitting: Family Medicine

## 2017-10-03 ENCOUNTER — Ambulatory Visit (INDEPENDENT_AMBULATORY_CARE_PROVIDER_SITE_OTHER): Payer: 59 | Admitting: Family Medicine

## 2017-10-03 VITALS — BP 137/78 | HR 63 | Temp 98.0°F | Ht 62.0 in | Wt 183.0 lb

## 2017-10-03 DIAGNOSIS — Z0289 Encounter for other administrative examinations: Secondary | ICD-10-CM

## 2017-10-03 DIAGNOSIS — R0602 Shortness of breath: Secondary | ICD-10-CM | POA: Diagnosis not present

## 2017-10-03 DIAGNOSIS — E669 Obesity, unspecified: Secondary | ICD-10-CM

## 2017-10-03 DIAGNOSIS — Z9189 Other specified personal risk factors, not elsewhere classified: Secondary | ICD-10-CM | POA: Diagnosis not present

## 2017-10-03 DIAGNOSIS — Z6833 Body mass index (BMI) 33.0-33.9, adult: Secondary | ICD-10-CM

## 2017-10-03 DIAGNOSIS — R5383 Other fatigue: Secondary | ICD-10-CM

## 2017-10-03 DIAGNOSIS — R739 Hyperglycemia, unspecified: Secondary | ICD-10-CM | POA: Diagnosis not present

## 2017-10-03 DIAGNOSIS — E66811 Obesity, class 1: Secondary | ICD-10-CM

## 2017-10-03 DIAGNOSIS — Z1331 Encounter for screening for depression: Secondary | ICD-10-CM

## 2017-10-03 NOTE — Progress Notes (Signed)
Office: [phone]  /  Fax: [phone]   Dear Dr. Jonni Sanger,   Thank you for referring Teresa Wells to our clinic. The following note includes my evaluation and treatment recommendations.  HPI:   Chief Complaint: OBESITY    Teresa Wells has been referred by Rosendo Gros L. Jonni Sanger, MD for consultation regarding her obesity and obesity related comorbidities.    Teresa Wells (MR# [id number]) is a 57 y.o. female who presents on 10/03/2017 for obesity evaluation and treatment. Current BMI is Body mass index is 33.47 kg/m.Marland Kitchen Keshia has been struggling with her weight for many years and has been unsuccessful in either losing weight, maintaining weight loss, or reaching her healthy weight goal.     Teresa Wells attended our information session and states she is currently in the action stage of change and ready to dedicate time achieving and maintaining a healthier weight. Teresa Wells is interested in becoming our patient and working on intensive lifestyle modifications including (but not limited to) diet, exercise and weight loss.    Teresa Wells states her family eats meals together she thinks her family will eat healthier with  her she struggles with family and or coworkers weight loss sabotage she started gaining weight 15 yrs ago she has significant food cravings issues  she snacks frequently in the evenings she wakes up frequently in the middle of the night to eat she skips meals frequently she is frequently drinking liquids with calories she frequently makes poor food choices she frequently eats larger portions than normal  she struggles with emotional eating    Fatigue Teresa Wells feels her energy is lower than it should be. This has worsened with weight gain and has not worsened recently. Teresa Wells admits to daytime somnolence and  admits to waking up still tired. Patient is at risk for obstructive sleep apnea. Patent has a history of symptoms of daytime fatigue and morning fatigue. Patient generally gets 6  or 7 hours of sleep per night, and states they generally have restless sleep. Snoring is present. Apneic episodes are not present. Epworth Sleepiness Score is 6  Dyspnea on exertion Teresa Wells notes increasing shortness of breath with exercising and seems to be worsening over time with weight gain. She notes getting out of breath sooner with activity than she used to. This has not gotten worse recently. Teresa Wells denies orthopnea.  Hyperglycemia Teresa Wells has a history of elevated and low blood glucose readings without a diagnosis of diabetes. There is no recent A1c result. She admits to polyphagia, polydipsia and polyuria.  Positive Depression Screening Teresa Wells has a very high depression screening and says her weight causes her to feel life isn't worth living approximately 50% of the time. She denies any suicidal or homicidal ideations. Teresa Wells is not on medications.  Depression Screen Teresa Wells's Food and Mood (modified PHQ-9) score was  Depression screen PHQ 2/9 10/03/2017  Decreased Interest 3  Down, Depressed, Hopeless 3  PHQ - 2 Score 6  Altered sleeping 3  Tired, decreased energy 3  Change in appetite 3  Feeling bad or failure about yourself  3  Trouble concentrating 1  Moving slowly or fidgety/restless 3  Suicidal thoughts 2  PHQ-9 Score 24  Difficult doing work/chores Extremely dIfficult    ALLERGIES: Allergies  Allergen Reactions  . Sulfasalazine Hives  . Oxycodone   . Penicillins Hives  . Sulfa Antibiotics Hives    MEDICATIONS: Current Outpatient Medications on File Prior to Visit  Medication Sig Dispense Refill  . cetirizine (ZYRTEC) 10 MG tablet  Take 10 mg by mouth daily. Reported on 10/03/2015    . guaiFENesin (MUCINEX) 600 MG 12 hr tablet Take 600 mg by mouth daily.    Marland Kitchen zolpidem (AMBIEN) 5 MG tablet Take 1 tablet (5 mg total) by mouth at bedtime as needed for sleep. 30 tablet 2   No current facility-administered medications on file prior to visit.     PAST MEDICAL  HISTORY: Past Medical History:  Diagnosis Date  . Acute meniscal tear of knee LEFT  . Allergy   . Anemia   . Anxiety   . Back pain   . Constipation   . Depression   . Insomnia   . Joint pain   . Leg edema     PAST SURGICAL HISTORY: Past Surgical History:  Procedure Laterality Date  . BILATERAL SALPINGOOPHORECTOMY  2009   LAPAROSCOPIC-- LEFT ADNEXAL MASS  . BREAST FIBROADENOMA SURGERY  2001   LEFT  . CARPAL TUNNEL RELEASE Left 08/27/2014   Procedure: LEFT CARPAL TUNNEL RELEASE;  Surgeon: Charlotte Crumb, MD;  Location: [facility];  Service: Orthopedics;  Laterality: Left;  . HYSTEROSCOPY WITH RESECTOSCOPE  2006   MYOMECTOMY  . INCISIONAL HERNIA REPAIR N/A 11/12/2013   Procedure: HERNIA REPAIR INCISIONAL;  Surgeon: Odis Hollingshead, MD;  Location: [facility];  Service: General;  Laterality: N/A;  . INSERTION OF MESH N/A 11/12/2013   Procedure: INSERTION OF MESH;  Surgeon: Odis Hollingshead, MD;  Location: [facility];  Service: General;  Laterality: N/A;  . KNEE ARTHROSCOPY  05/04/2011   Procedure: ARTHROSCOPY KNEE;  Surgeon: Johnn Hai;  Location: [facility];  Service: Orthopedics;  Laterality: Left;  . MENISCUS DEBRIDEMENT  05/04/2011   Procedure: DEBRIDEMENT OF MENISCUS;  Surgeon: Johnn Hai;  Location: [facility];  Service: Orthopedics;  Laterality: Left;  . MOLE REMOVAL Left 11/12/2013   Procedure: MOLE REMOVAL LEFT THIGH;  Surgeon: Odis Hollingshead, MD;  Location: [facility];  Service: General;  Laterality: Left;  . OPEN REDUCTION INTERNAL FIXATION (ORIF) DISTAL RADIAL FRACTURE Left 08/27/2014   Procedure: OPEN REDUCTION INTERNAL FIXATION (ORIF) LEFT DISTAL RADIAL FRACTURE;  Surgeon: Charlotte Crumb, MD;  Location: [place];  Service: Orthopedics;  Laterality: Left;    SOCIAL HISTORY: Social History   Tobacco Use  . Smoking status: Never Smoker  . Smokeless tobacco: Never Used  Substance Use Topics  . Alcohol  use: No    Frequency: Never    Comment: rare beer  . Drug use: No    FAMILY HISTORY: Family History  Problem Relation Age of Onset  . Diabetes Mother   . Heart disease Mother   . Hyperlipidemia Mother   . Depression Mother   . Anxiety disorder Mother   . Sleep apnea Mother   . Obesity Mother   . Diabetes Father   . Hypertension Father   . Stroke Father   . Heart disease Father   . Hyperlipidemia Father   . Heart disease Brother   . Hypertension Brother     ROS: Review of Systems  Constitutional: Positive for malaise/fatigue.  HENT: Positive for congestion (nasal stuffiness) and sinus pain.        Hoarseness  Eyes:       Wear Glasses or Contacts Floaters  Respiratory: Positive for shortness of breath.   Cardiovascular: Negative for orthopnea.       Positive for Shortness of Breath with Activity Calf/Leg Pain with Walking Leg Cramping   Gastrointestinal:  Positive for constipation.  Genitourinary: Positive for frequency.  Musculoskeletal: Positive for back pain.       Neck Stiffness Muscle or Joint Pain Muscle Stiffness  Skin:       Hair or Nail Changes  Neurological: Positive for weakness.  Endo/Heme/Allergies: Positive for polydipsia.       Excessive Hunger Heat/Cold Intolerance   Psychiatric/Behavioral: Positive for depression. The patient has insomnia.        Stress     PHYSICAL EXAM: Blood pressure 137/78, pulse 63, temperature 98 F (36.7 C), temperature source Oral, height 5\' 2"  (1.575 m), weight 183 lb (83 kg), SpO2 98 %. Body mass index is 33.47 kg/m. Physical Exam  Constitutional: She is oriented to person, place, and time. She appears well-developed and well-nourished.  HENT:  Head: Normocephalic and atraumatic.  Nose: Nose normal.  Eyes: EOM are normal. No scleral icterus.  Neck: Normal range of motion. Neck supple. No thyromegaly present.  Cardiovascular: Normal rate and regular rhythm.  Pulmonary/Chest: Effort normal. No respiratory  distress.  Abdominal: Soft. There is no tenderness.  + obesity  Musculoskeletal: Normal range of motion.  Range of Motion normal in all 4 extremities  Neurological: She is alert and oriented to person, place, and time. Coordination normal.  Skin: Skin is warm and dry.  Vitals reviewed.   RECENT LABS AND TESTS: BMET    Component Value Date/Time   NA 136 07/26/2017 1013   K 3.8 07/26/2017 1013   CL 99 07/26/2017 1013   CO2 27 07/26/2017 1013   GLUCOSE 137 (H) 07/26/2017 1013   BUN 5 (L) 07/26/2017 1013   CREATININE 0.79 07/26/2017 1013   CREATININE 0.74 09/03/2013 1227   CALCIUM 8.9 07/26/2017 1013   GFRNONAA >60 07/05/2017 0333   GFRAA >60 07/05/2017 0333   No results found for: HGBA1C No results found for: INSULIN CBC    Component Value Date/Time   WBC 16.1 (H) 07/26/2017 1013   RBC 4.55 07/26/2017 1013   HGB 13.5 07/26/2017 1013   HCT 40.8 07/26/2017 1013   PLT 322.0 07/26/2017 1013   MCV 89.7 07/26/2017 1013   MCH 29.6 07/05/2017 0942   MCHC 33.0 07/26/2017 1013   RDW 15.1 07/26/2017 1013   LYMPHSABS 1.7 07/26/2017 1013   MONOABS 0.9 07/26/2017 1013   EOSABS 0.7 07/26/2017 1013   BASOSABS 0.1 07/26/2017 1013   Iron/TIBC/Ferritin/ %Sat No results found for: IRON, TIBC, FERRITIN, IRONPCTSAT Lipid Panel     Component Value Date/Time   CHOL 231 (H) 10/04/2015 0730   TRIG 84.0 10/04/2015 0730   HDL 57.00 10/04/2015 0730   CHOLHDL 4 10/04/2015 0730   VLDL 16.8 10/04/2015 0730   LDLCALC 157 (H) 10/04/2015 0730   Hepatic Function Panel     Component Value Date/Time   PROT 7.0 07/02/2017 1830   ALBUMIN 2.4 (L) 07/02/2017 1830   AST 35 07/02/2017 1830   ALT 21 07/02/2017 1830   ALKPHOS 190 (H) 07/02/2017 1830   BILITOT 1.0 07/02/2017 1830   BILIDIR 0.3 07/02/2017 1830   IBILI 0.7 07/02/2017 1830      Component Value Date/Time   TSH 4.307 07/02/2017 1830   TSH 3.04 10/04/2015 0730   TSH 3.927 12/23/2013 1209   TSH 2.844 09/11/2011 1118    ECG  shows  NSR with a rate of 62 BPM INDIRECT CALORIMETER done today shows a VO2 of 235 and a REE of 1634.  Her calculated basal metabolic rate is 3790 thus her basal metabolic rate  is better than expected.    ASSESSMENT AND PLAN: Other fatigue - Plan: EKG 12-Lead, CBC with Differential/Platelet, Comprehensive metabolic panel, Lipid panel, VITAMIN D 25 Hydroxy (Vit-D Deficiency, Fractures), Vitamin B12, Folate, T3, T4, free, TSH  Shortness of breath on exertion  Positive depression screening  Hyperglycemia - Plan: Hemoglobin A1c, Insulin, random  Depression screening  Class 1 obesity with serious comorbidity and body mass index (BMI) of 33.0 to 33.9 in adult, unspecified obesity type  PLAN: Fatigue Tayjah was informed that her fatigue may be related to obesity, depression or many other causes. Labs will be ordered, and in the meanwhile Teresa Wells has agreed to work on diet, exercise and weight loss to help with fatigue. Proper sleep hygiene was discussed including the need for 7-8 hours of quality sleep each night. A sleep study was not ordered based on symptoms and Epworth score.  Dyspnea on exertion Teresa Wells's shortness of breath appears to be obesity related and exercise induced. She has agreed to work on weight loss and gradually increase exercise to treat her exercise induced shortness of breath. If Teresa Wells follows our instructions and loses weight without improvement of her shortness of breath, we will plan to refer to pulmonology. We will monitor this condition regularly. Teresa Wells agrees to this plan.  Hyperglycemia Fasting labs will be obtained and results with be discussed with Teresa Wells in 2 weeks at her follow up visit. In the meanwhile Teresa Wells was started on a lower simple carbohydrate diet and will work on weight loss efforts.  Positive Depression Screening Teresa Wells had a strongly positive depression screening. Depression is commonly associated with obesity and often results in emotional eating  behaviors. We will monitor this closely and work on CBT to help improve the non-hunger eating patterns. We will refer to Teresa Wells for evaluation.  Obesity Teresa Wells is currently in the action stage of change and her goal is to continue with weight loss efforts. I recommend Teresa Wells begin the structured treatment plan as follows:  She has agreed to follow the Category 2 plan +100 calories Derinda has been instructed to eventually work up to a goal of 150 minutes of combined cardio and strengthening exercise per week for weight loss and overall health benefits. We discussed the following Behavioral Modification Strategies today: increasing lean protein intake, decreasing simple carbohydrates  and work on meal planning and easy cooking plans   She was informed of the importance of frequent follow up visits to maximize her success with intensive lifestyle modifications for her multiple health conditions. She was informed we would discuss her lab results at her next visit unless there is a critical issue that needs to be addressed sooner. Reham agreed to keep her next visit at the agreed upon time to discuss these results.    OBESITY BEHAVIORAL INTERVENTION VISIT  Today's visit was # 1 out of 22.  Starting weight: 183 lbs Starting date: 10/03/17 Today's weight : 183 lbs  Today's date: 10/03/2017 Total lbs lost to date: 0 (Patients must lose 7 lbs in the first 6 months to continue with counseling)   ASK: We discussed the diagnosis of obesity with Teresa Wells today and Shaneika agreed to give [place] permission to discuss obesity behavioral modification therapy today.  ASSESS: Mylissa has the diagnosis of obesity and her BMI today is 33.46 Sherrise is in the action stage of change   ADVISE: Toneka was educated on the multiple health risks of obesity as well as the benefit of weight loss to improve her  health. She was advised of the need for long term treatment and the importance of lifestyle  modifications.  AGREE: Multiple dietary modification options and treatment options were discussed and  Jordis agreed to the above obesity treatment plan.   I, Doreene Nest, am acting as transcriptionist for  Dennard Nip, MD  I have reviewed the above documentation for accuracy and completeness, and I agree with the above. -Dennard Nip, MD

## 2017-10-04 LAB — COMPREHENSIVE METABOLIC PANEL
ALT: 27 IU/L (ref 0–32)
AST: 27 IU/L (ref 0–40)
Albumin/Globulin Ratio: 1.8 (ref 1.2–2.2)
Albumin: 4.4 g/dL (ref 3.5–5.5)
Alkaline Phosphatase: 101 IU/L (ref 39–117)
BILIRUBIN TOTAL: 0.6 mg/dL (ref 0.0–1.2)
BUN/Creatinine Ratio: 16 (ref 9–23)
BUN: 12 mg/dL (ref 6–24)
CALCIUM: 9.1 mg/dL (ref 8.7–10.2)
CHLORIDE: 101 mmol/L (ref 96–106)
CO2: 24 mmol/L (ref 20–29)
Creatinine, Ser: 0.73 mg/dL (ref 0.57–1.00)
GFR calc non Af Amer: 91 mL/min/{1.73_m2} (ref >59)
GFR, EST AFRICAN AMERICAN: 105 mL/min/{1.73_m2} (ref >59)
Globulin, Total: 2.5 g/dL (ref 1.5–4.5)
Glucose: 82 mg/dL (ref 65–99)
Potassium: 4.2 mmol/L (ref 3.5–5.2)
Sodium: 139 mmol/L (ref 134–144)
TOTAL PROTEIN: 6.9 g/dL (ref 6.0–8.5)

## 2017-10-04 LAB — CBC WITH DIFFERENTIAL/PLATELET
BASOS ABS: 0.1 10*3/uL (ref 0.0–0.2)
Basos: 1 %
EOS (ABSOLUTE): 0.2 10*3/uL (ref 0.0–0.4)
Eos: 2 %
Hematocrit: 44.1 % (ref 34.0–46.6)
Hemoglobin: 14.8 g/dL (ref 11.1–15.9)
IMMATURE GRANS (ABS): 0 10*3/uL (ref 0.0–0.1)
IMMATURE GRANULOCYTES: 0 %
LYMPHS: 32 %
Lymphocytes Absolute: 2.6 10*3/uL (ref 0.7–3.1)
MCH: 30.1 pg (ref 26.6–33.0)
MCHC: 33.6 g/dL (ref 31.5–35.7)
MCV: 90 fL (ref 79–97)
Monocytes Absolute: 0.8 10*3/uL (ref 0.1–0.9)
Monocytes: 9 %
NEUTROS PCT: 56 %
Neutrophils Absolute: 4.6 10*3/uL (ref 1.4–7.0)
PLATELETS: 315 10*3/uL (ref 150–450)
RBC: 4.91 x10E6/uL (ref 3.77–5.28)
RDW: 14.9 % (ref 12.3–15.4)
WBC: 8.2 10*3/uL (ref 3.4–10.8)

## 2017-10-04 LAB — FOLATE: FOLATE: 4.4 ng/mL (ref >3.0)

## 2017-10-04 LAB — LIPID PANEL
CHOLESTEROL TOTAL: 267 mg/dL — AB (ref 100–199)
Chol/HDL Ratio: 3.7 ratio (ref 0.0–4.4)
HDL: 73 mg/dL (ref >39)
LDL Calculated: 171 mg/dL — ABNORMAL HIGH (ref 0–99)
Triglycerides: 115 mg/dL (ref 0–149)
VLDL CHOLESTEROL CAL: 23 mg/dL (ref 5–40)

## 2017-10-04 LAB — VITAMIN D 25 HYDROXY (VIT D DEFICIENCY, FRACTURES): Vit D, 25-Hydroxy: 34.4 ng/mL (ref 30.0–100.0)

## 2017-10-04 LAB — HEMOGLOBIN A1C
ESTIMATED AVERAGE GLUCOSE: 103 mg/dL
Hgb A1c MFr Bld: 5.2 % (ref 4.8–5.6)

## 2017-10-04 LAB — T4, FREE: FREE T4: 0.97 ng/dL (ref 0.82–1.77)

## 2017-10-04 LAB — TSH: TSH: 4.36 u[IU]/mL (ref 0.450–4.500)

## 2017-10-04 LAB — INSULIN, RANDOM: INSULIN: 8.4 u[IU]/mL (ref 2.6–24.9)

## 2017-10-04 LAB — T3: T3 TOTAL: 129 ng/dL (ref 71–180)

## 2017-10-04 LAB — VITAMIN B12: VITAMIN B 12: 438 pg/mL (ref 232–1245)

## 2017-10-14 DIAGNOSIS — Z01419 Encounter for gynecological examination (general) (routine) without abnormal findings: Secondary | ICD-10-CM | POA: Diagnosis not present

## 2017-10-14 DIAGNOSIS — Z1231 Encounter for screening mammogram for malignant neoplasm of breast: Secondary | ICD-10-CM | POA: Diagnosis not present

## 2017-10-14 DIAGNOSIS — Z124 Encounter for screening for malignant neoplasm of cervix: Secondary | ICD-10-CM | POA: Diagnosis not present

## 2017-10-15 ENCOUNTER — Other Ambulatory Visit: Payer: Self-pay | Admitting: Family Medicine

## 2017-10-15 ENCOUNTER — Telehealth: Payer: Self-pay | Admitting: Adult Health

## 2017-10-15 NOTE — Telephone Encounter (Signed)
Due ov for cpe in November. No further refills w/o OV. cla

## 2017-10-15 NOTE — Telephone Encounter (Signed)
Called [facility] and spoke with Tammy stating the instructions from 08/28/17 when pt called trying to taper off prednisone and instructions for pt to take 20mg  x3days,10mg x3days,5mg x3days and then stop due to pt having side effects and trying to get off of med.  Tammy (pharmacist) stated she had received a script from 5/20 and wanted to know if that script needed to be filled for pt.  I stated to her not to fill that script.  Tammy expressed understanding. Nothing further needed at this time.

## 2017-10-15 NOTE — Telephone Encounter (Signed)
Last OV 09/03/17, No future OV  Last filled 07/10/17, # 30 with 2 refills

## 2017-10-21 ENCOUNTER — Ambulatory Visit (INDEPENDENT_AMBULATORY_CARE_PROVIDER_SITE_OTHER): Payer: Self-pay | Admitting: Psychology

## 2017-10-21 ENCOUNTER — Ambulatory Visit (INDEPENDENT_AMBULATORY_CARE_PROVIDER_SITE_OTHER): Payer: 59 | Admitting: Family Medicine

## 2017-10-21 VITALS — BP 132/71 | HR 67 | Temp 98.2°F | Ht 62.0 in | Wt 176.0 lb

## 2017-10-21 DIAGNOSIS — E7849 Other hyperlipidemia: Secondary | ICD-10-CM

## 2017-10-21 DIAGNOSIS — Z9189 Other specified personal risk factors, not elsewhere classified: Secondary | ICD-10-CM

## 2017-10-21 DIAGNOSIS — Z6832 Body mass index (BMI) 32.0-32.9, adult: Secondary | ICD-10-CM

## 2017-10-21 DIAGNOSIS — E669 Obesity, unspecified: Secondary | ICD-10-CM

## 2017-10-21 DIAGNOSIS — F321 Major depressive disorder, single episode, moderate: Secondary | ICD-10-CM

## 2017-10-21 DIAGNOSIS — E559 Vitamin D deficiency, unspecified: Secondary | ICD-10-CM

## 2017-10-21 MED ORDER — VITAMIN D (ERGOCALCIFEROL) 1.25 MG (50000 UNIT) PO CAPS: 50000.0000 [IU] | ORAL_CAPSULE | ORAL | 0 refills | Status: DC
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
## 2017-10-21 NOTE — Progress Notes (Signed)
Office: [phone]  /  Fax: [phone]   HPI:   Chief Complaint: OBESITY Teresa Wells is here to discuss her progress with her obesity treatment plan. She is on the Category 2 plan +100 calories and is following her eating plan approximately 95 % of the time. She states she is exercising 0 minutes 0 times per week. Teresa Wells did very well with diet, exercise and weight loss on her category 2 plan. Hunger was mostly controlled. She did well with .planning meals ahead of time. Her weight is 176 lb (79.8 kg) today and has had a weight loss of 7 pounds over a period of 2 weeks since her last visit. She has lost 7 lbs since starting treatment with [place].  Vitamin D deficiency (new) Teresa Wells has a new diagnosis of vitamin D deficiency. She is not currently taking vit D and admits fatigue but denies nausea, vomiting or muscle weakness.  Hyperlipidemia Teresa Wells has hyperlipidemia. Her LDL is elevated at 171 and she has a positive family history of coronary artery disease. She is not on a statin and she would like to control her cholesterol levels with intensive lifestyle modification including a low saturated fat diet, exercise and weight loss. She denies any chest pain, claudication or myalgias.  At risk for diabetes Teresa Wells is at higher than average risk for developing diabetes due to her obesity. She currently denies polyuria or polydipsia.  ALLERGIES: Allergies  Allergen Reactions  . Sulfasalazine Hives  . Oxycodone   . Penicillins Hives  . Sulfa Antibiotics Hives    MEDICATIONS: Current Outpatient Medications on File Prior to Visit  Medication Sig Dispense Refill  . cetirizine (ZYRTEC) 10 MG tablet Take 10 mg by mouth daily. Reported on 10/03/2015    . guaiFENesin (MUCINEX) 600 MG 12 hr tablet Take 600 mg by mouth daily.    Teresa Wells Kitchen zolpidem (AMBIEN) 5 MG tablet TAKE 1 TABLET (5 MG TOTAL) BY MOUTH AT BEDTIME AS NEEDED FOR SLEEP. 30 tablet 5   No current facility-administered medications on file prior  to visit.     PAST MEDICAL HISTORY: Past Medical History:  Diagnosis Date  . Acute meniscal tear of knee LEFT  . Allergy   . Anemia   . Anxiety   . Back pain   . Constipation   . Depression   . Insomnia   . Joint pain   . Leg edema     PAST SURGICAL HISTORY: Past Surgical History:  Procedure Laterality Date  . BILATERAL SALPINGOOPHORECTOMY  2009   LAPAROSCOPIC-- LEFT ADNEXAL MASS  . BREAST FIBROADENOMA SURGERY  2001   LEFT  . CARPAL TUNNEL RELEASE Left 08/27/2014   Procedure: LEFT CARPAL TUNNEL RELEASE;  Surgeon: Charlotte Crumb, MD;  Location: [place];  Service: Orthopedics;  Laterality: Left;  . HYSTEROSCOPY WITH RESECTOSCOPE  2006   MYOMECTOMY  . INCISIONAL HERNIA REPAIR N/A 11/12/2013   Procedure: HERNIA REPAIR INCISIONAL;  Surgeon: Odis Hollingshead, MD;  Location: [facility];  Service: General;  Laterality: N/A;  . INSERTION OF MESH N/A 11/12/2013   Procedure: INSERTION OF MESH;  Surgeon: Odis Hollingshead, MD;  Location: [facility];  Service: General;  Laterality: N/A;  . KNEE ARTHROSCOPY  05/04/2011   Procedure: ARTHROSCOPY KNEE;  Surgeon: Johnn Hai;  Location: [place];  Service: Orthopedics;  Laterality: Left;  . MENISCUS DEBRIDEMENT  05/04/2011   Procedure: DEBRIDEMENT OF MENISCUS;  Surgeon: Johnn Hai;  Location: [place];  Service: Orthopedics;  Laterality: Left;  . MOLE REMOVAL Left 11/12/2013   Procedure: MOLE REMOVAL LEFT THIGH;  Surgeon: Odis Hollingshead, MD;  Location: [facility];  Service: General;  Laterality: Left;  . OPEN REDUCTION INTERNAL FIXATION (ORIF) DISTAL RADIAL FRACTURE Left 08/27/2014   Procedure: OPEN REDUCTION INTERNAL FIXATION (ORIF) LEFT DISTAL RADIAL FRACTURE;  Surgeon: Charlotte Crumb, MD;  Location: [place];  Service: Orthopedics;  Laterality: Left;    SOCIAL HISTORY: Social History   Tobacco Use  . Smoking status: Never Smoker  . Smokeless tobacco: Never Used    Substance Use Topics  . Alcohol use: No    Frequency: Never    Comment: rare beer  . Drug use: No    FAMILY HISTORY: Family History  Problem Relation Age of Onset  . Diabetes Mother   . Heart disease Mother   . Hyperlipidemia Mother   . Depression Mother   . Anxiety disorder Mother   . Sleep apnea Mother   . Obesity Mother   . Diabetes Father   . Hypertension Father   . Stroke Father   . Heart disease Father   . Hyperlipidemia Father   . Heart disease Brother   . Hypertension Brother     ROS: Review of Systems  Constitutional: Positive for malaise/fatigue and weight loss.  Cardiovascular: Negative for chest pain and claudication.  Gastrointestinal: Negative for nausea and vomiting.  Genitourinary: Negative for frequency.  Musculoskeletal: Negative for myalgias.       Negative for muscle weakness  Endo/Heme/Allergies: Negative for polydipsia.    PHYSICAL EXAM: Blood pressure 132/71, pulse 67, temperature 98.2 F (36.8 C), temperature source Oral, height 5\' 2"  (1.575 m), weight 176 lb (79.8 kg), SpO2 98 %. Body mass index is 32.19 kg/m. Physical Exam  Constitutional: She is oriented to person, place, and time. She appears well-developed and well-nourished.  Cardiovascular: Normal rate.  Pulmonary/Chest: Effort normal.  Musculoskeletal: Normal range of motion.  Neurological: She is oriented to person, place, and time.  Skin: Skin is warm and dry.  Psychiatric: She has a normal mood and affect. Her behavior is normal.  Vitals reviewed.   RECENT LABS AND TESTS: BMET    Component Value Date/Time   NA 139 10/03/2017 0930   K 4.2 10/03/2017 0930   CL 101 10/03/2017 0930   CO2 24 10/03/2017 0930   GLUCOSE 82 10/03/2017 0930   GLUCOSE 137 (H) 07/26/2017 1013   BUN 12 10/03/2017 0930   CREATININE 0.73 10/03/2017 0930   CREATININE 0.74 09/03/2013 1227   CALCIUM 9.1 10/03/2017 0930   GFRNONAA 91 10/03/2017 0930   GFRAA 105 10/03/2017 0930   Lab Results   Component Value Date   HGBA1C 5.2 10/03/2017   Lab Results  Component Value Date   INSULIN 8.4 10/03/2017   CBC    Component Value Date/Time   WBC 8.2 10/03/2017 0930   WBC 16.1 (H) 07/26/2017 1013   RBC 4.91 10/03/2017 0930   RBC 4.55 07/26/2017 1013   HGB 14.8 10/03/2017 0930   HCT 44.1 10/03/2017 0930   PLT 315 10/03/2017 0930   MCV 90 10/03/2017 0930   MCH 30.1 10/03/2017 0930   MCH 29.6 07/05/2017 0942   MCHC 33.6 10/03/2017 0930   MCHC 33.0 07/26/2017 1013   RDW 14.9 10/03/2017 0930   LYMPHSABS 2.6 10/03/2017 0930   MONOABS 0.9 07/26/2017 1013   EOSABS 0.2 10/03/2017 0930   BASOSABS 0.1 10/03/2017 0930   Iron/TIBC/Ferritin/ %Sat No results found for: IRON,  TIBC, FERRITIN, IRONPCTSAT Lipid Panel     Component Value Date/Time   CHOL 267 (H) 10/03/2017 0930   TRIG 115 10/03/2017 0930   HDL 73 10/03/2017 0930   CHOLHDL 3.7 10/03/2017 0930   CHOLHDL 4 10/04/2015 0730   VLDL 16.8 10/04/2015 0730   LDLCALC 171 (H) 10/03/2017 0930   Hepatic Function Panel     Component Value Date/Time   PROT 6.9 10/03/2017 0930   ALBUMIN 4.4 10/03/2017 0930   AST 27 10/03/2017 0930   ALT 27 10/03/2017 0930   ALKPHOS 101 10/03/2017 0930   BILITOT 0.6 10/03/2017 0930   BILIDIR 0.3 07/02/2017 1830   IBILI 0.7 07/02/2017 1830      Component Value Date/Time   TSH 4.360 10/03/2017 0930   TSH 4.307 07/02/2017 1830   TSH 3.04 10/04/2015 0730   TSH 3.927 12/23/2013 1209   Results for ALYNNA, HARGROVE (MRN [id number]) as of 10/21/2017 15:41  Ref. Range 10/03/2017 09:30  Vitamin D, 25-Hydroxy Latest Ref Range: 30.0 - 100.0 ng/mL 34.4   ASSESSMENT AND PLAN: Vitamin D deficiency - Plan: Vitamin D, Ergocalciferol, (DRISDOL) 50000 units CAPS capsule  Other hyperlipidemia  At risk for diabetes mellitus  Class 1 obesity with serious comorbidity and body mass index (BMI) of 32.0 to 32.9 in adult, unspecified obesity type  PLAN:  Vitamin D Deficiency (new) Teresa Wells was informed  that low vitamin D levels contributes to fatigue and are associated with obesity, breast, and colon cancer. She agrees to start prescription Vit D @50 ,000 IU every week #4 with no refills and will follow up for routine testing of vitamin D, at least 2-3 times per year. She was informed of the risk of over-replacement of vitamin D and agrees to not increase her dose unless she discusses this with [place] first. Teresa Wells agrees to follow up as directed.  Hyperlipidemia Teresa Wells was informed of the American Heart Association Guidelines emphasizing intensive lifestyle modifications as the first line treatment for hyperlipidemia. We discussed many lifestyle modifications today in depth, and Teresa Wells will continue to work on decreasing saturated fats such as fatty red meat, butter and many fried foods. She will also increase vegetables and lean protein in her diet and continue to work on exercise and weight loss efforts. We will recheck labs in 3 months and we may need to start a statin at that time.  Diabetes risk counseling Teresa Wells was given extended (30 minutes) diabetes prevention counseling today. She is 57 y.o. female and has risk factors for diabetes including obesity. We discussed intensive lifestyle modifications today with an emphasis on weight loss as well as increasing exercise and decreasing simple carbohydrates in her diet.  Obesity Teresa Wells is currently in the action stage of change. As such, her goal is to continue with weight loss efforts She has agreed to follow the Category 2 plan +100 calories. Increase calories by 200 calories with limits. Teresa Wells has been instructed to work up to a goal of 150 minutes of combined cardio and strengthening exercise per week for weight loss and overall health benefits. We discussed the following Behavioral Modification Strategies today: increasing lean protein intake and decreasing simple carbohydrates   Teresa Wells has agreed to follow up with our clinic in 2 to 3 weeks.  She was informed of the importance of frequent follow up visits to maximize her success with intensive lifestyle modifications for her multiple health conditions.   OBESITY BEHAVIORAL INTERVENTION VISIT  Today's visit was # 2 out of 22.  Starting weight:  183 lbs Starting date: 10/03/17 Today's weight : 176 lbs Today's date: 10/21/2017 Total lbs lost to date: 7 (Patients must lose 7 lbs in the first 6 months to continue with counseling)   ASK: We discussed the diagnosis of obesity with Teresa Wells today and Teresa Wells agreed to give [place] permission to discuss obesity behavioral modification therapy today.  ASSESS: Teresa Wells has the diagnosis of obesity and her BMI today is 32.18 Teresa Wells is in the action stage of change   ADVISE: Teresa Wells was educated on the multiple health risks of obesity as well as the benefit of weight loss to improve her health. She was advised of the need for long term treatment and the importance of lifestyle modifications.  AGREE: Multiple dietary modification options and treatment options were discussed and  Teresa Wells agreed to the above obesity treatment plan.  I, Doreene Nest, am acting as transcriptionist for Dennard Nip, MD  I have reviewed the above documentation for accuracy and completeness, and I agree with the above. -Dennard Nip, MD

## 2017-11-11 ENCOUNTER — Ambulatory Visit (INDEPENDENT_AMBULATORY_CARE_PROVIDER_SITE_OTHER): Payer: 59 | Admitting: Family Medicine

## 2017-11-11 VITALS — BP 128/67 | HR 64 | Temp 98.1°F | Ht 62.0 in | Wt 171.0 lb

## 2017-11-11 DIAGNOSIS — Z9189 Other specified personal risk factors, not elsewhere classified: Secondary | ICD-10-CM

## 2017-11-11 DIAGNOSIS — E559 Vitamin D deficiency, unspecified: Secondary | ICD-10-CM

## 2017-11-11 DIAGNOSIS — E669 Obesity, unspecified: Secondary | ICD-10-CM

## 2017-11-11 DIAGNOSIS — Z6831 Body mass index (BMI) 31.0-31.9, adult: Secondary | ICD-10-CM | POA: Diagnosis not present

## 2017-11-11 MED ORDER — VITAMIN D (ERGOCALCIFEROL) 1.25 MG (50000 UNIT) PO CAPS: 50000.0000 [IU] | ORAL_CAPSULE | ORAL | 0 refills | Status: DC

## 2017-11-11 NOTE — Progress Notes (Signed)
Office: [phone]  /  Fax: [phone]   HPI:   Chief Complaint: OBESITY Teresa Wells is here to discuss her progress with her obesity treatment plan. She is on the Category 2 plan + 100 calories, increase calories by 200 calories with limits and is following her eating plan approximately 90-95 % of the time. She states she is walking for 20 minutes 2 times per week. Teresa Wells continues to do well with weight loss. She is working on eating all her food and splitting her meals which works better for her.  Her weight is 171 lb (77.6 kg) today and has had a weight loss of 5 pounds over a period of 3 weeks since her last visit. She has lost 12 lbs since starting treatment with [place].  Vitamin D Deficiency Teresa Wells has a diagnosis of vitamin D deficiency. She is stable on prescription Vit D, not yet at goal. She notes fatigue is improving and denies nausea, vomiting or muscle weakness.  At risk for osteopenia and osteoporosis Teresa Wells is at higher risk of osteopenia and osteoporosis due to vitamin D deficiency.   ALLERGIES: Allergies  Allergen Reactions  . Sulfasalazine Hives  . Oxycodone   . Penicillins Hives  . Sulfa Antibiotics Hives    MEDICATIONS: Current Outpatient Medications on File Prior to Visit  Medication Sig Dispense Refill  . cetirizine (ZYRTEC) 10 MG tablet Take 10 mg by mouth daily. Reported on 10/03/2015    . guaiFENesin (MUCINEX) 600 MG 12 hr tablet Take 600 mg by mouth daily.    Marland Kitchen zolpidem (AMBIEN) 5 MG tablet TAKE 1 TABLET (5 MG TOTAL) BY MOUTH AT BEDTIME AS NEEDED FOR SLEEP. 30 tablet 5   No current facility-administered medications on file prior to visit.     PAST MEDICAL HISTORY: Past Medical History:  Diagnosis Date  . Acute meniscal tear of knee LEFT  . Allergy   . Anemia   . Anxiety   . Back pain   . Constipation   . Depression   . Insomnia   . Joint pain   . Leg edema     PAST SURGICAL HISTORY: Past Surgical History:  Procedure Laterality Date  .  BILATERAL SALPINGOOPHORECTOMY  2009   LAPAROSCOPIC-- LEFT ADNEXAL MASS  . BREAST FIBROADENOMA SURGERY  2001   LEFT  . CARPAL TUNNEL RELEASE Left 08/27/2014   Procedure: LEFT CARPAL TUNNEL RELEASE;  Surgeon: Charlotte Crumb, MD;  Location: [place];  Service: Orthopedics;  Laterality: Left;  . HYSTEROSCOPY WITH RESECTOSCOPE  2006   MYOMECTOMY  . INCISIONAL HERNIA REPAIR N/A 11/12/2013   Procedure: HERNIA REPAIR INCISIONAL;  Surgeon: Odis Hollingshead, MD;  Location[facility];  Service: General;  Laterality: N/A;  . INSERTION OF MESH N/A 11/12/2013   Procedure: INSERTION OF MESH;  Surgeon: Odis Hollingshead, MD;  Location[facility];  Service: General;  Laterality: N/A;  . KNEE ARTHROSCOPY  05/04/2011   Procedure: ARTHROSCOPY KNEE;  Surgeon: Johnn Hai;  Location: [place];  Service: Orthopedics;  Laterality: Left;  . MENISCUS DEBRIDEMENT  05/04/2011   Procedure: DEBRIDEMENT OF MENISCUS;  Surgeon: Johnn Hai;  Location: [place];  Service: Orthopedics;  Laterality: Left;  . MOLE REMOVAL Left 11/12/2013   Procedure: MOLE REMOVAL LEFT THIGH;  Surgeon: Odis Hollingshead, MD;  Location: [facility];  Service: General;  Laterality: Left;  . OPEN REDUCTION INTERNAL FIXATION (ORIF) DISTAL RADIAL FRACTURE Left 08/27/2014   Procedure: OPEN REDUCTION INTERNAL FIXATION (ORIF)  LEFT DISTAL RADIAL FRACTURE;  Surgeon: Charlotte Crumb, MD;  Location: [place];  Service: Orthopedics;  Laterality: Left;    SOCIAL HISTORY: Social History   Tobacco Use  . Smoking status: Never Smoker  . Smokeless tobacco: Never Used  Substance Use Topics  . Alcohol use: No    Frequency: Never    Comment: rare beer  . Drug use: No    FAMILY HISTORY: Family History  Problem Relation Age of Onset  . Diabetes Mother   . Heart disease Mother   . Hyperlipidemia Mother   . Depression Mother   . Anxiety disorder Mother   . Sleep apnea Mother   . Obesity  Mother   . Diabetes Father   . Hypertension Father   . Stroke Father   . Heart disease Father   . Hyperlipidemia Father   . Heart disease Brother   . Hypertension Brother     ROS: Review of Systems  Constitutional: Positive for malaise/fatigue and weight loss.  Gastrointestinal: Negative for nausea and vomiting.  Musculoskeletal:       Negative muscle weakness    PHYSICAL EXAM: Blood pressure 128/67, pulse 64, temperature 98.1 F (36.7 C), temperature source Oral, height 5\' 2"  (1.575 m), weight 171 lb (77.6 kg), SpO2 98 %. Body mass index is 31.28 kg/m. Physical Exam  Constitutional: She is oriented to person, place, and time. She appears well-developed and well-nourished.  Cardiovascular: Normal rate.  Pulmonary/Chest: Effort normal.  Musculoskeletal: Normal range of motion.  Neurological: She is oriented to person, place, and time.  Skin: Skin is warm and dry.  Psychiatric: She has a normal mood and affect. Her behavior is normal.  Vitals reviewed.   RECENT LABS AND TESTS: BMET    Component Value Date/Time   NA 139 10/03/2017 0930   K 4.2 10/03/2017 0930   CL 101 10/03/2017 0930   CO2 24 10/03/2017 0930   GLUCOSE 82 10/03/2017 0930   GLUCOSE 137 (H) 07/26/2017 1013   BUN 12 10/03/2017 0930   CREATININE 0.73 10/03/2017 0930   CREATININE 0.74 09/03/2013 1227   CALCIUM 9.1 10/03/2017 0930   GFRNONAA 91 10/03/2017 0930   GFRAA 105 10/03/2017 0930   Lab Results  Component Value Date   HGBA1C 5.2 10/03/2017   Lab Results  Component Value Date   INSULIN 8.4 10/03/2017   CBC    Component Value Date/Time   WBC 8.2 10/03/2017 0930   WBC 16.1 (H) 07/26/2017 1013   RBC 4.91 10/03/2017 0930   RBC 4.55 07/26/2017 1013   HGB 14.8 10/03/2017 0930   HCT 44.1 10/03/2017 0930   PLT 315 10/03/2017 0930   MCV 90 10/03/2017 0930   MCH 30.1 10/03/2017 0930   MCH 29.6 07/05/2017 0942   MCHC 33.6 10/03/2017 0930   MCHC 33.0 07/26/2017 1013   RDW 14.9 10/03/2017 0930    LYMPHSABS 2.6 10/03/2017 0930   MONOABS 0.9 07/26/2017 1013   EOSABS 0.2 10/03/2017 0930   BASOSABS 0.1 10/03/2017 0930   Iron/TIBC/Ferritin/ %Sat No results found for: IRON, TIBC, FERRITIN, IRONPCTSAT Lipid Panel     Component Value Date/Time   CHOL 267 (H) 10/03/2017 0930   TRIG 115 10/03/2017 0930   HDL 73 10/03/2017 0930   CHOLHDL 3.7 10/03/2017 0930   CHOLHDL 4 10/04/2015 0730   VLDL 16.8 10/04/2015 0730   LDLCALC 171 (H) 10/03/2017 0930   Hepatic Function Panel     Component Value Date/Time   PROT 6.9 10/03/2017 0930  ALBUMIN 4.4 10/03/2017 0930   AST 27 10/03/2017 0930   ALT 27 10/03/2017 0930   ALKPHOS 101 10/03/2017 0930   BILITOT 0.6 10/03/2017 0930   BILIDIR 0.3 07/02/2017 1830   IBILI 0.7 07/02/2017 1830      Component Value Date/Time   TSH 4.360 10/03/2017 0930   TSH 4.307 07/02/2017 1830   TSH 3.04 10/04/2015 0730   TSH 3.927 12/23/2013 1209  Results for TACI, STERLING (MRN [id number]) as of 11/11/2017 12:03  Ref. Range 10/03/2017 09:30  Vitamin D, 25-Hydroxy Latest Ref Range: 30.0 - 100.0 ng/mL 34.4    ASSESSMENT AND PLAN: Vitamin D deficiency - Plan: Vitamin D, Ergocalciferol, (DRISDOL) 50000 units CAPS capsule  At risk for osteoporosis  Class 1 obesity with serious comorbidity and body mass index (BMI) of 31.0 to 31.9 in adult, unspecified obesity type  PLAN:  Vitamin D Deficiency Teresa Wells was informed that low vitamin D levels contributes to fatigue and are associated with obesity, breast, and colon cancer. Teresa Wells agrees to continue taking prescription Vit D @50 ,000 IU every week #4 and we will refill for 1 month. She will follow up for routine testing of vitamin D, at least 2-3 times per year. She was informed of the risk of over-replacement of vitamin D and agrees to not increase her dose unless she discusses this with [place] first. We will recheck labs in 2 months and Teresa Wells agrees to follow up with our clinic in 3 weeks.  At risk for osteopenia  and osteoporosis Teresa Wells is at risk for osteopenia and osteoporsis due to her vitamin D deficiency. She was encouraged to take her vitamin D and follow her higher calcium diet and increase strengthening exercise to help strengthen her bones and decrease her risk of osteopenia and osteoporosis.  Obesity Teresa Wells is currently in the action stage of change. As such, her goal is to continue with weight loss efforts She has agreed to follow the Category 2 plan + 100 calories Teresa Wells has been instructed to work up to a goal of 150 minutes of combined cardio and strengthening exercise per week for weight loss and overall health benefits. We discussed the following Behavioral Modification Strategies today: increase H20 intake   Teresa Wells has agreed to follow up with our clinic in 3 weeks. She was informed of the importance of frequent follow up visits to maximize her success with intensive lifestyle modifications for her multiple health conditions.   OBESITY BEHAVIORAL INTERVENTION VISIT  Today's visit was # 4 out of 22.  Starting weight: 183 lbs Starting date: 10/03/17 Today's weight : 171 lbs Today's date: 11/11/2017 Total lbs lost to date: 12    ASK: We discussed the diagnosis of obesity with Teresa Wells today and Teresa Wells agreed to give [place] permission to discuss obesity behavioral modification therapy today.  ASSESS: Teresa Wells has the diagnosis of obesity and her BMI today is 31.27 Teresa Wells is in the action stage of change   ADVISE: Teresa Wells was educated on the multiple health risks of obesity as well as the benefit of weight loss to improve her health. She was advised of the need for long term treatment and the importance of lifestyle modifications.  AGREE: Multiple dietary modification options and treatment options were discussed and  Teresa Wells agreed to the above obesity treatment plan.  I, Trixie Dredge, am acting as transcriptionist for Dennard Nip, MD  I have reviewed the above  documentation for accuracy and completeness, and I agree with the above. -Dennard Nip, MD

## 2017-11-13 ENCOUNTER — Encounter (INDEPENDENT_AMBULATORY_CARE_PROVIDER_SITE_OTHER): Payer: Self-pay

## 2017-11-13 ENCOUNTER — Ambulatory Visit (INDEPENDENT_AMBULATORY_CARE_PROVIDER_SITE_OTHER): Payer: Self-pay | Admitting: Family Medicine

## 2017-11-26 ENCOUNTER — Ambulatory Visit (INDEPENDENT_AMBULATORY_CARE_PROVIDER_SITE_OTHER): Payer: 59 | Admitting: Family Medicine

## 2017-11-26 VITALS — BP 124/69 | HR 65 | Temp 98.1°F | Ht 62.0 in | Wt 170.0 lb

## 2017-11-26 DIAGNOSIS — E559 Vitamin D deficiency, unspecified: Secondary | ICD-10-CM

## 2017-11-26 DIAGNOSIS — E669 Obesity, unspecified: Secondary | ICD-10-CM | POA: Diagnosis not present

## 2017-11-26 DIAGNOSIS — Z6831 Body mass index (BMI) 31.0-31.9, adult: Secondary | ICD-10-CM

## 2017-11-26 DIAGNOSIS — Z9189 Other specified personal risk factors, not elsewhere classified: Secondary | ICD-10-CM

## 2017-11-26 DIAGNOSIS — E66811 Obesity, class 1: Secondary | ICD-10-CM

## 2017-11-26 MED ORDER — VITAMIN D (ERGOCALCIFEROL) 1.25 MG (50000 UNIT) PO CAPS: 50000.0000 [IU] | ORAL_CAPSULE | ORAL | 0 refills | Status: DC

## 2017-11-26 NOTE — Progress Notes (Signed)
Office: [phone]  /  Fax: [phone]   HPI:   Chief Complaint: OBESITY Teresa Wells is here to discuss her progress with her obesity treatment plan. She is on the Category 2 plan + 100 calories and is following her eating plan approximately 90-95 % of the time. She states she walks all day long 5 times per week. [name] has increased traveling and eating out but working hard on portion control and increasing vegetables and protein. Hunger is controlled.  Her weight is 170 lb (77.1 kg) today and has had a weight loss of 1 pound over a period of 2 weeks since her last visit. She has lost 13 lbs since starting treatment with [place].  Vitamin D Deficiency Teresa Wells has a diagnosis of vitamin D deficiency. She is doing well on Vit D, not yet at goal but feels her fatigue is starting to improve. She denies nausea, vomiting or muscle weakness.  At risk for osteopenia and osteoporosis Teresa Wells is at higher risk of osteopenia and osteoporosis due to vitamin D deficiency.   ALLERGIES: Allergies  Allergen Reactions  . Sulfasalazine Hives  . Oxycodone   . Penicillins Hives  . Sulfa Antibiotics Hives    MEDICATIONS: Current Outpatient Medications on File Prior to Visit  Medication Sig Dispense Refill  . cetirizine (ZYRTEC) 10 MG tablet Take 10 mg by mouth daily. Reported on 10/03/2015    . guaiFENesin (MUCINEX) 600 MG 12 hr tablet Take 600 mg by mouth daily.    Marland Kitchen zolpidem (AMBIEN) 5 MG tablet TAKE 1 TABLET (5 MG TOTAL) BY MOUTH AT BEDTIME AS NEEDED FOR SLEEP. 30 tablet 5   No current facility-administered medications on file prior to visit.     PAST MEDICAL HISTORY: Past Medical History:  Diagnosis Date  . Acute meniscal tear of knee LEFT  . Allergy   . Anemia   . Anxiety   . Back pain   . Constipation   . Depression   . Insomnia   . Joint pain   . Leg edema     PAST SURGICAL HISTORY: Past Surgical History:  Procedure Laterality Date  . BILATERAL SALPINGOOPHORECTOMY  2009   LAPAROSCOPIC-- LEFT ADNEXAL MASS  . BREAST FIBROADENOMA SURGERY  2001   LEFT  . CARPAL TUNNEL RELEASE Left 08/27/2014   Procedure: LEFT CARPAL TUNNEL RELEASE;  Surgeon: Charlotte Crumb, MD;  Location: [place];  Service: Orthopedics;  Laterality: Left;  . HYSTEROSCOPY WITH RESECTOSCOPE  2006   MYOMECTOMY  . INCISIONAL HERNIA REPAIR N/A 11/12/2013   Procedure: HERNIA REPAIR INCISIONAL;  Surgeon: Odis Hollingshead, MD;  [facility];  Service: General;  Laterality: N/A;  . INSERTION OF MESH N/A 11/12/2013   Procedure: INSERTION OF MESH;  Surgeon: Odis Hollingshead, MD;  [facility];  Service: General;  Laterality: N/A;  . KNEE ARTHROSCOPY  05/04/2011   Procedure: ARTHROSCOPY KNEE;  Surgeon: Johnn Hai;  Location: [place];  Service: Orthopedics;  Laterality: Left;  . MENISCUS DEBRIDEMENT  05/04/2011   Procedure: DEBRIDEMENT OF MENISCUS;  Surgeon: Johnn Hai;  Location: [place];  Service: Orthopedics;  Laterality: Left;  . MOLE REMOVAL Left 11/12/2013   Procedure: MOLE REMOVAL LEFT THIGH;  Surgeon: Odis Hollingshead, MD;  [facility];  Service: General;  Laterality: Left;  . OPEN REDUCTION INTERNAL FIXATION (ORIF) DISTAL RADIAL FRACTURE Left 08/27/2014   Procedure: OPEN REDUCTION INTERNAL FIXATION (ORIF) LEFT DISTAL RADIAL FRACTURE;  Surgeon: Charlotte Crumb, MD;  Location: [place];  Service: Orthopedics;  Laterality: Left;    SOCIAL HISTORY: Social History   Tobacco Use  . Smoking status: Never Smoker  . Smokeless tobacco: Never Used  Substance Use Topics  . Alcohol use: No    Frequency: Never    Comment: rare beer  . Drug use: No    FAMILY HISTORY: Family History  Problem Relation Age of Onset  . Diabetes Mother   . Heart disease Mother   . Hyperlipidemia Mother   . Depression Mother   . Anxiety disorder Mother   . Sleep apnea Mother   . Obesity Mother   . Diabetes Father   .  Hypertension Father   . Stroke Father   . Heart disease Father   . Hyperlipidemia Father   . Heart disease Brother   . Hypertension Brother     ROS: Review of Systems  Constitutional: Positive for malaise/fatigue and weight loss.  Gastrointestinal: Negative for nausea and vomiting.  Musculoskeletal:       Negative muscle weakness    PHYSICAL EXAM: Blood pressure 124/69, pulse 65, temperature 98.1 F (36.7 C), temperature source Oral, height 5\' 2"  (1.575 m), weight 170 lb (77.1 kg), SpO2 98 %. Body mass index is 31.09 kg/m. Physical Exam  Constitutional: She is oriented to person, place, and time. She appears well-developed and well-nourished.  Cardiovascular: Normal rate.  Pulmonary/Chest: Effort normal.  Musculoskeletal: Normal range of motion.  Neurological: She is oriented to person, place, and time.  Skin: Skin is warm and dry.  Psychiatric: She has a normal mood and affect. Her behavior is normal.  Vitals reviewed.   RECENT LABS AND TESTS: BMET    Component Value Date/Time   NA 139 10/03/2017 0930   K 4.2 10/03/2017 0930   CL 101 10/03/2017 0930   CO2 24 10/03/2017 0930   GLUCOSE 82 10/03/2017 0930   GLUCOSE 137 (H) 07/26/2017 1013   BUN 12 10/03/2017 0930   CREATININE 0.73 10/03/2017 0930   CREATININE 0.74 09/03/2013 1227   CALCIUM 9.1 10/03/2017 0930   GFRNONAA 91 10/03/2017 0930   GFRAA 105 10/03/2017 0930   Lab Results  Component Value Date   HGBA1C 5.2 10/03/2017   Lab Results  Component Value Date   INSULIN 8.4 10/03/2017   CBC    Component Value Date/Time   WBC 8.2 10/03/2017 0930   WBC 16.1 (H) 07/26/2017 1013   RBC 4.91 10/03/2017 0930   RBC 4.55 07/26/2017 1013   HGB 14.8 10/03/2017 0930   HCT 44.1 10/03/2017 0930   PLT 315 10/03/2017 0930   MCV 90 10/03/2017 0930   MCH 30.1 10/03/2017 0930   MCH 29.6 07/05/2017 0942   MCHC 33.6 10/03/2017 0930   MCHC 33.0 07/26/2017 1013   RDW 14.9 10/03/2017 0930   LYMPHSABS 2.6 10/03/2017 0930    MONOABS 0.9 07/26/2017 1013   EOSABS 0.2 10/03/2017 0930   BASOSABS 0.1 10/03/2017 0930   Iron/TIBC/Ferritin/ %Sat No results found for: IRON, TIBC, FERRITIN, IRONPCTSAT Lipid Panel     Component Value Date/Time   CHOL 267 (H) 10/03/2017 0930   TRIG 115 10/03/2017 0930   HDL 73 10/03/2017 0930   CHOLHDL 3.7 10/03/2017 0930   CHOLHDL 4 10/04/2015 0730   VLDL 16.8 10/04/2015 0730   LDLCALC 171 (H) 10/03/2017 0930   Hepatic Function Panel     Component Value Date/Time   PROT 6.9 10/03/2017 0930   ALBUMIN 4.4 10/03/2017 0930   AST 27  10/03/2017 0930   ALT 27 10/03/2017 0930   ALKPHOS 101 10/03/2017 0930   BILITOT 0.6 10/03/2017 0930   BILIDIR 0.3 07/02/2017 1830   IBILI 0.7 07/02/2017 1830      Component Value Date/Time   TSH 4.360 10/03/2017 0930   TSH 4.307 07/02/2017 1830   TSH 3.04 10/04/2015 0730   TSH 3.927 12/23/2013 1209  Results for HANI, PATNODE (MRN [id number]) as of 11/27/2017 07:56  Ref. Range 10/03/2017 09:30  Vitamin D, 25-Hydroxy Latest Ref Range: 30.0 - 100.0 ng/mL 34.4    ASSESSMENT AND PLAN: Vitamin D deficiency - Plan: Vitamin D, Ergocalciferol, (DRISDOL) 50000 units CAPS capsule  At risk for osteoporosis  Class 1 obesity with serious comorbidity and body mass index (BMI) of 31.0 to 31.9 in adult, unspecified obesity type  PLAN:  Vitamin D Deficiency Sophie was informed that low vitamin D levels contributes to fatigue and are associated with obesity, breast, and colon cancer. Leiliana agrees to continue taking prescription Vit D @50 ,000 IU every week #4 and we will refill for 1 month. She will follow up for routine testing of vitamin D, at least 2-3 times per year. She was informed of the risk of over-replacement of vitamin D and agrees to not increase her dose unless she discusses this with [place] first. Oluwateniola agrees to follow up with our clinic in 2 to 3 weeks.  At risk for osteopenia and osteoporosis Clella is at risk for osteopenia and  osteoporsis due to her vitamin D deficiency. She was encouraged to take her vitamin D and follow her higher calcium diet and increase strengthening exercise to help strengthen her bones and decrease her risk of osteopenia and osteoporosis.  Obesity Ailene is currently in the action stage of change. As such, her goal is to continue with weight loss efforts She has agreed to follow the Category 2 plan Maurisa has been instructed to work up to a goal of 150 minutes of combined cardio and strengthening exercise per week or track steps to get daily average for weight loss and overall health benefits. We discussed the following Behavioral Modification Strategies today: increasing lean protein intake, travel eating strategies, and decreasing simple carbohydrates    Rielyn has agreed to follow up with our clinic in 2 to 3 weeks. She was informed of the importance of frequent follow up visits to maximize her success with intensive lifestyle modifications for her multiple health conditions.   OBESITY BEHAVIORAL INTERVENTION VISIT  Today's visit was # 5 out of 22.  Starting weight: 183 lbs Starting date: 10/03/17 Today's weight : 170 lbs  Today's date: 11/26/2017 Total lbs lost to date: 13    ASK: We discussed the diagnosis of obesity with Joesphine Bare today and Loriel agreed to give [place] permission to discuss obesity behavioral modification therapy today.  ASSESS: Lakesia has the diagnosis of obesity and her BMI today is 31.09 Adell is in the action stage of change   ADVISE: Jaunice was educated on the multiple health risks of obesity as well as the benefit of weight loss to improve her health. She was advised of the need for long term treatment and the importance of lifestyle modifications.  AGREE: Multiple dietary modification options and treatment options were discussed and  Zannie agreed to the above obesity treatment plan.  I, Trixie Dredge, am acting as transcriptionist for Dennard Nip, MD  I have reviewed the above documentation for accuracy and completeness, and I agree with the above. -Karista Aispuro Leafy Ro,  MD   

## 2017-12-19 ENCOUNTER — Encounter (INDEPENDENT_AMBULATORY_CARE_PROVIDER_SITE_OTHER): Payer: Self-pay

## 2017-12-19 ENCOUNTER — Ambulatory Visit: Payer: 59 | Admitting: Physician Assistant

## 2017-12-19 ENCOUNTER — Ambulatory Visit (INDEPENDENT_AMBULATORY_CARE_PROVIDER_SITE_OTHER): Payer: 59 | Admitting: Family Medicine

## 2017-12-19 ENCOUNTER — Encounter: Payer: Self-pay | Admitting: Physician Assistant

## 2017-12-19 ENCOUNTER — Other Ambulatory Visit: Payer: Self-pay

## 2017-12-19 VITALS — BP 110/70 | HR 82 | Temp 98.5°F | Resp 14 | Ht 62.0 in | Wt 172.0 lb

## 2017-12-19 DIAGNOSIS — J209 Acute bronchitis, unspecified: Secondary | ICD-10-CM

## 2017-12-19 MED ORDER — AZITHROMYCIN 250 MG PO TABS: ORAL_TABLET | ORAL | 0 refills | Status: DC

## 2017-12-19 NOTE — Progress Notes (Signed)
Subjective:     KRYSTALL KRUCKENBERG is a 57 y.o. female who presents for evaluation of symptoms of a URI. Symptoms include congestion, cough described as productive of yellow sputum, fever (subjective), nasal congestion, sinus pressure and sore throat. Onset of symptoms was 7 days ago, and has been rapidly worsening since that time. Treatment to date: antihistamines, cough suppressants and decongestants.  The following portions of the patient's history were reviewed and updated as appropriate: allergies, current medications, past family history, past medical history, past social history, past surgical history and problem list.  Review of Systems Pertinent items are noted in HPI.   Objective:    BP 110/70   Pulse 82   Temp 98.5 F (36.9 C) (Oral)   Resp 14   Ht 5\' 2"  (1.575 m)   Wt 172 lb (78 kg)   SpO2 98%   BMI 31.46 kg/m  General appearance: alert, cooperative, appears stated age and no distress Ears: normal TM's and external ear canals both ears Nose: Nares normal. Septum midline. Mucosa normal. No drainage or sinus tenderness. Throat: lips, mucosa, and tongue normal; teeth and gums normal Lungs: clear to auscultation bilaterally Heart: regular rate and rhythm, S1, S2 normal, no murmur, click, rub or gallop Lymph nodes: Cervical, supraclavicular, and axillary nodes normal.   Assessment:   Acute bacterial bronchitis  Plan:    Discussed diagnosis and treatment of URI. Suggested symptomatic OTC remedies. Start Mucinex-DM. Nasal saline spray for congestion. Zithromax per orders. Follow up as needed.

## 2017-12-19 NOTE — Patient Instructions (Signed)
Take antibiotic (Azithromycin) as directed.  Increase fluids.  Get plenty of rest. Use Mucinex for congestion. Take a daily probiotic (I recommend Align or Culturelle, but even Activia Yogurt may be beneficial).  A humidifier placed in the bedroom may offer some relief for a dry, scratchy throat of nasal irritation.  Read information below on acute bronchitis. Please call or return to clinic if symptoms are not improving.  Acute Bronchitis Bronchitis is when the airways that extend from the windpipe into the lungs get red, puffy, and painful (inflamed). Bronchitis often causes thick spit (mucus) to develop. This leads to a cough. A cough is the most common symptom of bronchitis. In acute bronchitis, the condition usually begins suddenly and goes away over time (usually in 2 weeks). Smoking, allergies, and asthma can make bronchitis worse. Repeated episodes of bronchitis may cause more lung problems.  HOME CARE  Rest.  Drink enough fluids to keep your pee (urine) clear or pale yellow (unless you need to limit fluids as told by your doctor).  Only take over-the-counter or prescription medicines as told by your doctor.  Avoid smoking and secondhand smoke. These can make bronchitis worse. If you are a smoker, think about using nicotine gum or skin patches. Quitting smoking will help your lungs heal faster.  Reduce the chance of getting bronchitis again by:  Washing your hands often.  Avoiding people with cold symptoms.  Trying not to touch your hands to your mouth, nose, or eyes.  Follow up with your doctor as told.  GET HELP IF: Your symptoms do not improve after 1 week of treatment. Symptoms include:  Cough.  Fever.  Coughing up thick spit.  Body aches.  Chest congestion.  Chills.  Shortness of breath.  Sore throat.  GET HELP RIGHT AWAY IF:   You have an increased fever.  You have chills.  You have severe shortness of breath.  You have bloody thick spit  (sputum).  You throw up (vomit) often.  You lose too much body fluid (dehydration).  You have a severe headache.  You faint.  MAKE SURE YOU:   Understand these instructions.  Will watch your condition.  Will get help right away if you are not doing well or get worse. Document Released: 10/03/2007 Document Revised: 12/17/2012 Document Reviewed: 10/07/2012 [facility] Patient Information 2015 [place], [place]. This information is not intended to replace advice given to you by your health care provider. Make sure you discuss any questions you have with your health care provider.

## 2017-12-20 ENCOUNTER — Ambulatory Visit: Payer: 59 | Admitting: Physician Assistant

## 2017-12-25 ENCOUNTER — Telehealth: Payer: Self-pay | Admitting: Emergency Medicine

## 2017-12-25 NOTE — Telephone Encounter (Signed)
Reason for CRM: patient is calling and was seen on 12/19/17 for acute bronchitis. She states that she has finished the antibiotic but still does not feel like it has gone away. She is afraid of getting pneumonia again and would like for Dr. Jonni Sanger to give her a call. She thinks she may need another round of antibiotics.  Please advise.

## 2017-12-25 NOTE — Telephone Encounter (Signed)
Please call patient: i'd like to see her in the office tomorrow if possible. Thanks.

## 2017-12-26 ENCOUNTER — Ambulatory Visit: Payer: 59 | Admitting: Family Medicine

## 2017-12-26 ENCOUNTER — Ambulatory Visit (INDEPENDENT_AMBULATORY_CARE_PROVIDER_SITE_OTHER): Payer: 59

## 2017-12-26 ENCOUNTER — Other Ambulatory Visit: Payer: Self-pay

## 2017-12-26 ENCOUNTER — Encounter: Payer: Self-pay | Admitting: Family Medicine

## 2017-12-26 VITALS — BP 130/70 | HR 86 | Temp 98.5°F | Ht 62.0 in | Wt 172.0 lb

## 2017-12-26 DIAGNOSIS — R05 Cough: Secondary | ICD-10-CM

## 2017-12-26 DIAGNOSIS — J209 Acute bronchitis, unspecified: Secondary | ICD-10-CM

## 2017-12-26 DIAGNOSIS — R058 Other specified cough: Secondary | ICD-10-CM

## 2017-12-26 MED ORDER — PREDNISONE 20 MG PO TABS: ORAL_TABLET | ORAL | 0 refills | Status: DC

## 2017-12-26 NOTE — Telephone Encounter (Signed)
Pt has been schedule for 12-26-17 at 930 am

## 2017-12-26 NOTE — Progress Notes (Signed)
Subjective  CC:  Chief Complaint  Patient presents with  . Cough    productive cough, green mucus, chest congesiton, doesnt feel like she is getting better    HPI: Teresa Wells is a 57 y.o. female who presents to the office today to address the problems listed above in the chief complaint.  Clear returns due to persistent productive cough.  Evaluated and treated August 22 for acute bronchitis with Z-Pak.  She is feeling slightly better however worried because her cough remains productive.  She has history of severe pneumonia requiring hospitalization.  Fortunately, follow-up with pulmonary has not revealed any chronic lung disease and her most recent chest x-ray in April was normal.  She denies wheezing, shortness of breath, chest pain, fevers, chills or decreased appetite.  Assessment  1. Productive cough   2. Acute bronchitis, unspecified organism      Plan   Bronchitis with productive cough: Clinical exam is reassuring.  Chest x-ray to be sure lungs remain clear given history of progressive respiratory failure.  Prednisone burst.  Mucinex DM twice a day.  No further antibiotics at this time unless symptoms worsen.  She will check in with me in a few days.  Follow up: Return if symptoms worsen or fail to improve.   Orders Placed This Encounter  Procedures  . DG Chest 2 View   Meds ordered this encounter  Medications  . predniSONE (DELTASONE) 20 MG tablet    Sig: Take 3 tabs daily for 5 days    Dispense:  15 tablet    Refill:  0      I reviewed the patients updated PMH, FH, and SocHx.    Patient Active Problem List   Diagnosis Date Noted  . Pneumonitis 07/26/2017  . Chronic respiratory failure with hypoxia ([place]) 07/26/2017  . Obesity with body mass index 30 or greater 07/10/2017  . 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 Acute hypoxemic respiratory failure ([place]) 07/02/2017  . Insomnia 10/03/2015  . Seasonal allergies 05/29/2012   Current Meds  Medication  Sig  . albuterol (PROVENTIL HFA;VENTOLIN HFA) 108 (90 Base) MCG/ACT inhaler INHALE 2 PUFFS INTO THE LUNGS EVERY 4 (FOUR) HOURS AS NEEDED FOR WHEEZING OR SHORTNESS OF BREATH.  . cetirizine (ZYRTEC) 10 MG tablet Take 10 mg by mouth daily. Reported on 10/03/2015  . fluticasone (FLONASE) 50 MCG/ACT nasal spray Place 2 sprays into both nostrils daily.  . Vitamin D, Ergocalciferol, (DRISDOL) 50000 units CAPS capsule Take 50,000 Units by mouth every 7 (seven) days.  Marland Kitchen zolpidem (AMBIEN) 5 MG tablet TAKE 1 TABLET (5 MG TOTAL) BY MOUTH AT BEDTIME AS NEEDED FOR SLEEP.    Allergies: Patient is allergic to sulfasalazine; oxycodone; penicillins; and sulfa antibiotics. Family History: Patient family history includes Anxiety disorder in her mother; Depression in her mother; Diabetes in her father and mother; Heart disease in her brother, father, and mother; Hyperlipidemia in her father and mother; Hypertension in her brother and father; Obesity in her mother; Sleep apnea in her mother; Stroke in her father. Social History:  Patient  reports that she has never smoked. She has never used smokeless tobacco. She reports that she does not drink alcohol or use drugs.  Review of Systems: Constitutional: Negative for fever malaise or anorexia Cardiovascular: negative for chest pain Respiratory: negative for SOB +persistent cough Gastrointestinal: negative for abdominal pain  Objective  Vitals: BP 130/70   Pulse 86   Temp 98.5 F (36.9 C)   Ht 5\' 2"  (  1.575 m)   Wt 172 lb (78 kg)   SpO2 95%   BMI 31.46 kg/m  General: no acute distress , A&Ox3, no retractions, no respiratory distress HEENT: PEERL, conjunctiva normal, Oropharynx moist,neck is supple Cardiovascular:  RRR without murmur or gallop.  Respiratory:  Good breath sounds bilaterally, CTAB with normal respiratory effort Skin:  Warm, no rashes     Commons side effects, risks, benefits, and alternatives for medications and treatment plan prescribed  today were discussed, and the patient expressed understanding of the given instructions. Patient is instructed to call or message via MyChart if he/she has any questions or concerns regarding our treatment plan. No barriers to understanding were identified. We discussed Red Flag symptoms and signs in detail. Patient expressed understanding regarding what to do in case of urgent or emergency type symptoms.   Medication list was reconciled, printed and provided to the patient in AVS. Patient instructions and summary information was reviewed with the patient as documented in the AVS. This note was prepared with assistance of Dragon voice recognition software. Occasional wrong-word or sound-a-like substitutions may have occurred due to the inherent limitations of voice recognition software

## 2017-12-26 NOTE — Patient Instructions (Signed)
Please follow up if symptoms do not improve or as needed.   Please go to our [facility] office to get your xrays done. You can walk in M-F between 8am and 5pm. Tell them you are there for xrays ordered by me. They will send me the results, then I will let you know the results with instructions.   Address: [place], [place], [place]  (office sits at [address] at [address] intersection; from here, turn left onto [place] [address]), take to [address], turn right and go for a mile or so, office will be on left across form [employer] )

## 2018-01-02 ENCOUNTER — Ambulatory Visit (INDEPENDENT_AMBULATORY_CARE_PROVIDER_SITE_OTHER): Payer: 59 | Admitting: Family Medicine

## 2018-01-02 VITALS — BP 146/78 | HR 66 | Temp 97.7°F | Ht 62.0 in | Wt 169.0 lb

## 2018-01-02 DIAGNOSIS — R030 Elevated blood-pressure reading, without diagnosis of hypertension: Secondary | ICD-10-CM

## 2018-01-02 DIAGNOSIS — E559 Vitamin D deficiency, unspecified: Secondary | ICD-10-CM | POA: Diagnosis not present

## 2018-01-02 DIAGNOSIS — Z9189 Other specified personal risk factors, not elsewhere classified: Secondary | ICD-10-CM

## 2018-01-02 DIAGNOSIS — E669 Obesity, unspecified: Secondary | ICD-10-CM | POA: Diagnosis not present

## 2018-01-02 DIAGNOSIS — Z6831 Body mass index (BMI) 31.0-31.9, adult: Secondary | ICD-10-CM

## 2018-01-02 MED ORDER — VITAMIN D (ERGOCALCIFEROL) 1.25 MG (50000 UNIT) PO CAPS: 50000.0000 [IU] | ORAL_CAPSULE | ORAL | 0 refills | Status: DC

## 2018-01-03 NOTE — Progress Notes (Signed)
Office: [phone]  /  Fax: [phone]   HPI:   Chief Complaint: OBESITY Teresa Wells is here to discuss her progress with her obesity treatment plan. She is on the  follow the Category 2 plan and is following her eating plan approximately 95-96 % of the time. She states she is exercising by walking for 30 minutes 3 times per week. Teresa Wells continues to do well with weight loss on her Cat 2 plan. She is walking during work. Hunger is controlled but she noticed increased cravings while on prednisone for bronchitis.  Her weight is 169 lb (76.7 kg) today and has had a weight loss of 1 pounds over a period of 1 week since her last visit. She has lost 14  lbs since starting treatment with [place].  Vitamin D deficiency Teresa Wells has a diagnosis of vitamin D deficiency. She is currently taking vit D but still not yet at goal. She denies nausea, vomiting or muscle weakness.   Ref. Range 10/03/2017 09:30  Vitamin D, 25-Hydroxy Latest Ref Range: 30.0 - 100.0 ng/mL 34.4   High Blood Pressure Teresa Wells had an elevated blood pressure reading in the office today. She does not have a history of hypertension. Her blood pressure is normally controlled, but she has a stressful day at work. Denies chest pain and headache.   At risk for cardiovascular disease Teresa Wells is at a higher than average risk for cardiovascular disease due to obesity. She currently denies any chest pain.  ALLERGIES: Allergies  Allergen Reactions  . Sulfasalazine Hives  . Oxycodone   . Penicillins Hives  . Sulfa Antibiotics Hives    MEDICATIONS: Current Outpatient Medications on File Prior to Visit  Medication Sig Dispense Refill  . albuterol (PROVENTIL HFA;VENTOLIN HFA) 108 (90 Base) MCG/ACT inhaler INHALE 2 PUFFS INTO THE LUNGS EVERY 4 (FOUR) HOURS AS NEEDED FOR WHEEZING OR SHORTNESS OF BREATH.  5  . cetirizine (ZYRTEC) 10 MG tablet Take 10 mg by mouth daily. Reported on 10/03/2015    . fluticasone (FLONASE) 50 MCG/ACT nasal spray Place 2  sprays into both nostrils daily.    Marland Kitchen zolpidem (AMBIEN) 5 MG tablet TAKE 1 TABLET (5 MG TOTAL) BY MOUTH AT BEDTIME AS NEEDED FOR SLEEP. 30 tablet 5   No current facility-administered medications on file prior to visit.     PAST MEDICAL HISTORY: Past Medical History:  Diagnosis Date  . Acute meniscal tear of knee LEFT  . Allergy   . Anemia   . Anxiety   . Back pain   . Constipation   . Depression   . Insomnia   . Joint pain   . Leg edema     PAST SURGICAL HISTORY: Past Surgical History:  Procedure Laterality Date  . BILATERAL SALPINGOOPHORECTOMY  2009   LAPAROSCOPIC-- LEFT ADNEXAL MASS  . BREAST FIBROADENOMA SURGERY  2001   LEFT  . CARPAL TUNNEL RELEASE Left 08/27/2014   Procedure: LEFT CARPAL TUNNEL RELEASE;  Surgeon: Charlotte Crumb, MD;  Location: [facility];  Service: Orthopedics;  Laterality: Left;  . HYSTEROSCOPY WITH RESECTOSCOPE  2006   MYOMECTOMY  . INCISIONAL HERNIA REPAIR N/A 11/12/2013   Procedure: HERNIA REPAIR INCISIONAL;  Surgeon: Odis Hollingshead, MD;  Location: [facility];  Service: General;  Laterality: N/A;  . INSERTION OF MESH N/A 11/12/2013   Procedure: INSERTION OF MESH;  Surgeon: Odis Hollingshead, MD;  Location: [facility];  Service: General;  Laterality: N/A;  . KNEE ARTHROSCOPY  05/04/2011   Procedure: ARTHROSCOPY KNEE;  Surgeon: Johnn Hai;  Location: [facility];  Service: Orthopedics;  Laterality: Left;  . MENISCUS DEBRIDEMENT  05/04/2011   Procedure: DEBRIDEMENT OF MENISCUS;  Surgeon: Johnn Hai;  Location: [facility];  Service: Orthopedics;  Laterality: Left;  . MOLE REMOVAL Left 11/12/2013   Procedure: MOLE REMOVAL LEFT THIGH;  Surgeon: Odis Hollingshead, MD;  Location: [facility];  Service: General;  Laterality: Left;  . OPEN REDUCTION INTERNAL FIXATION (ORIF) DISTAL RADIAL FRACTURE Left 08/27/2014   Procedure: OPEN REDUCTION INTERNAL FIXATION (ORIF) LEFT DISTAL RADIAL FRACTURE;  Surgeon: Charlotte Crumb,  MD;  Location: [facility];  Service: Orthopedics;  Laterality: Left;    SOCIAL HISTORY: Social History   Tobacco Use  . Smoking status: Never Smoker  . Smokeless tobacco: Never Used  Substance Use Topics  . Alcohol use: No    Frequency: Never    Comment: rare beer  . Drug use: No    FAMILY HISTORY: Family History  Problem Relation Age of Onset  . Diabetes Mother   . Heart disease Mother   . Hyperlipidemia Mother   . Depression Mother   . Anxiety disorder Mother   . Sleep apnea Mother   . Obesity Mother   . Diabetes Father   . Hypertension Father   . Stroke Father   . Heart disease Father   . Hyperlipidemia Father   . Heart disease Brother   . Hypertension Brother     ROS: Review of Systems  Constitutional: Positive for weight loss.  Cardiovascular: Negative for chest pain.  Gastrointestinal: Negative for nausea and vomiting.  Musculoskeletal:       Negative for muscle weakness  Neurological: Negative for headaches.    PHYSICAL EXAM: Blood pressure (!) 146/78, pulse 66, temperature 97.7 F (36.5 C), temperature source Oral, height 5\' 2"  (1.575 m), weight 169 lb (76.7 kg), SpO2 100 %. Body mass index is 30.91 kg/m. Physical Exam  Constitutional: She is oriented to person, place, and time. She appears well-developed and well-nourished.  HENT:  Head: Normocephalic.  Eyes: Pupils are equal, round, and reactive to light. EOM are normal.  Neck: Normal range of motion.  Cardiovascular: Normal rate.  Pulmonary/Chest: Effort normal.  Musculoskeletal: Normal range of motion.  Neurological: She is alert and oriented to person, place, and time.  Skin: Skin is warm and dry.  Psychiatric: She has a normal mood and affect. Her behavior is normal.  Vitals reviewed.   RECENT LABS AND TESTS: BMET    Component Value Date/Time   NA 139 10/03/2017 0930   K 4.2 10/03/2017 0930   CL 101 10/03/2017 0930   CO2 24 10/03/2017 0930   GLUCOSE 82 10/03/2017  0930   GLUCOSE 137 (H) 07/26/2017 1013   BUN 12 10/03/2017 0930   CREATININE 0.73 10/03/2017 0930   CREATININE 0.74 09/03/2013 1227   CALCIUM 9.1 10/03/2017 0930   GFRNONAA 91 10/03/2017 0930   GFRAA 105 10/03/2017 0930   Lab Results  Component Value Date   HGBA1C 5.2 10/03/2017   Lab Results  Component Value Date   INSULIN 8.4 10/03/2017   CBC    Component Value Date/Time   WBC 8.2 10/03/2017 0930   WBC 16.1 (H) 07/26/2017 1013   RBC 4.91 10/03/2017 0930   RBC 4.55 07/26/2017 1013   HGB 14.8 10/03/2017 0930   HCT 44.1 10/03/2017 0930   PLT 315 10/03/2017 0930   MCV 90 10/03/2017 0930   MCH 30.1 10/03/2017 0930  MCH 29.6 07/05/2017 0942   MCHC 33.6 10/03/2017 0930   MCHC 33.0 07/26/2017 1013   RDW 14.9 10/03/2017 0930   LYMPHSABS 2.6 10/03/2017 0930   MONOABS 0.9 07/26/2017 1013   EOSABS 0.2 10/03/2017 0930   BASOSABS 0.1 10/03/2017 0930   Iron/TIBC/Ferritin/ %Sat No results found for: IRON, TIBC, FERRITIN, IRONPCTSAT Lipid Panel     Component Value Date/Time   CHOL 267 (H) 10/03/2017 0930   TRIG 115 10/03/2017 0930   HDL 73 10/03/2017 0930   CHOLHDL 3.7 10/03/2017 0930   CHOLHDL 4 10/04/2015 0730   VLDL 16.8 10/04/2015 0730   LDLCALC 171 (H) 10/03/2017 0930   Hepatic Function Panel     Component Value Date/Time   PROT 6.9 10/03/2017 0930   ALBUMIN 4.4 10/03/2017 0930   AST 27 10/03/2017 0930   ALT 27 10/03/2017 0930   ALKPHOS 101 10/03/2017 0930   BILITOT 0.6 10/03/2017 0930   BILIDIR 0.3 07/02/2017 1830   IBILI 0.7 07/02/2017 1830      Component Value Date/Time   TSH 4.360 10/03/2017 0930   TSH 4.307 07/02/2017 1830   TSH 3.04 10/04/2015 0730   TSH 3.927 12/23/2013 1209    Ref. Range 10/03/2017 09:30  Vitamin D, 25-Hydroxy Latest Ref Range: 30.0 - 100.0 ng/mL 34.4    ASSESSMENT AND PLAN: Vitamin D deficiency - Plan: Vitamin D, Ergocalciferol, (DRISDOL) 50000 units CAPS capsule  Elevated blood pressure reading  At risk for heart  disease  Class 1 obesity with serious comorbidity and body mass index (BMI) of 31.0 to 31.9 in adult, unspecified obesity type  PLAN: Vitamin D Deficiency Teresa Wells was informed that low vitamin D levels contributes to fatigue and are associated with obesity, breast, and colon cancer. She agrees to continue to take prescription Vit D @50 ,000 IU every week #4 with no refills and will follow up for routine testing of vitamin D, at least 2-3 times per year. She was informed of the risk of over-replacement of vitamin D and agrees to not increase her dose unless she discusses this with [place] first.  We will check labs at the next visit. She agreed to follow up with our clinic as directed.   High Blood Pressure Teresa Wells was informed to continue with diet and exercise. We will recheck blood pressure in 2-3 weeks.   Cardiovascular risk counseling Teresa Wells was given extended (15 minutes) coronary artery disease prevention counseling today. She is 57 y.o. female and has risk factors for heart disease including obesity. We discussed intensive lifestyle modifications today with an emphasis on specific weight loss instructions and strategies. Pt was also informed of the importance of increasing exercise and decreasing saturated fats to help prevent heart disease.  Obesity Teresa Wells is currently in the action stage of change. As such, her goal is to continue with weight loss efforts She has agreed to follow the Category 2 plan Teresa Wells has been instructed to work up to a goal of 150 minutes of combined cardio and strengthening exercise per week for weight loss and overall health benefits. We discussed the following Behavioral Modification Strategies today: increasing lean protein intake, decreasing simple carbohydrates , work on meal planning and easy cooking plans and emotional eating strategies   Teresa Wells has agreed to follow up with our clinic in 2-3 weeks. She was informed of the importance of frequent follow up visits  to maximize her success with intensive lifestyle modifications for her multiple health conditions.   OBESITY BEHAVIORAL INTERVENTION VISIT  Today's visit was #  5   Starting weight: 183 lb Starting date: 10/03/17 Today's weight : 169 lb Today's date: 01/02/18 Total lbs lost to date: 14 lb    ASK: We discussed the diagnosis of obesity with Teresa Wells today and Teresa Wells agreed to give [place] permission to discuss obesity behavioral modification therapy today.  ASSESS: Teresa Wells has the diagnosis of obesity and her BMI today is 30.9 Teresa Wells is in the action stage of change   ADVISE: Teresa Wells was educated on the multiple health risks of obesity as well as the benefit of weight loss to improve her health. She was advised of the need for long term treatment and the importance of lifestyle modifications to improve her current health and to decrease her risk of future health problems.  AGREE: Multiple dietary modification options and treatment options were discussed and  Teresa Wells agreed to follow the recommendations documented in the above note.  ARRANGE: Teresa Wells was educated on the importance of frequent visits to treat obesity as outlined per CMS and USPSTF guidelines and agreed to schedule her next follow up appointment today.  I, Renee Ramus, am acting as transcriptionist for Dennard Nip, MD  I have reviewed the above documentation for accuracy and completeness, and I agree with the above. -Dennard Nip, MD

## 2018-01-23 ENCOUNTER — Ambulatory Visit (INDEPENDENT_AMBULATORY_CARE_PROVIDER_SITE_OTHER): Payer: Self-pay | Admitting: Family Medicine

## 2018-02-06 ENCOUNTER — Ambulatory Visit (INDEPENDENT_AMBULATORY_CARE_PROVIDER_SITE_OTHER): Payer: Self-pay | Admitting: Family Medicine

## 2018-02-13 ENCOUNTER — Telehealth: Payer: Self-pay | Admitting: Family Medicine

## 2018-02-13 ENCOUNTER — Telehealth: Payer: Self-pay | Admitting: *Deleted

## 2018-02-13 NOTE — Telephone Encounter (Signed)
Previous phone note has been created and routed to PCP to advise.

## 2018-02-13 NOTE — Telephone Encounter (Signed)
Copied from [place] [id number]. Topic: General - Other >> Feb 13, 2018  3:46 PM Cecelia Byars, NT wrote: Reason for CRM: The patient called and would like to get a flu shot and  also pneumonia at the same time

## 2018-02-13 NOTE — Telephone Encounter (Signed)
Patient wanting to know if she can come in for a pneumonia vaccine.   States that Dr. Jonni Sanger told her she could just come in and get it.   Routing to PCP for verification on which one patient needs and I will call her back to get her scheduled.

## 2018-02-16 NOTE — Telephone Encounter (Signed)
Patient is due for complete physical with pap. Please ask her to schedule and I will update vaccinations then. thanks

## 2018-02-17 NOTE — Telephone Encounter (Signed)
I have discussed with patient and she has scheduled CPE for next week.

## 2018-02-25 ENCOUNTER — Ambulatory Visit (INDEPENDENT_AMBULATORY_CARE_PROVIDER_SITE_OTHER): Payer: 59 | Admitting: Family Medicine

## 2018-02-25 ENCOUNTER — Other Ambulatory Visit: Payer: Self-pay

## 2018-02-25 ENCOUNTER — Encounter: Payer: Self-pay | Admitting: Family Medicine

## 2018-02-25 VITALS — BP 124/80 | HR 81 | Temp 97.9°F | Ht 62.0 in | Wt 174.8 lb

## 2018-02-25 DIAGNOSIS — Z0000 Encounter for general adult medical examination without abnormal findings: Secondary | ICD-10-CM | POA: Diagnosis not present

## 2018-02-25 DIAGNOSIS — F5101 Primary insomnia: Secondary | ICD-10-CM

## 2018-02-25 DIAGNOSIS — E559 Vitamin D deficiency, unspecified: Secondary | ICD-10-CM | POA: Diagnosis not present

## 2018-02-25 DIAGNOSIS — Z23 Encounter for immunization: Secondary | ICD-10-CM

## 2018-02-25 LAB — CBC WITH DIFFERENTIAL/PLATELET
BASOS ABS: 0.1 10*3/uL (ref 0.0–0.1)
Basophils Relative: 1 % (ref 0.0–3.0)
EOS ABS: 0.7 10*3/uL (ref 0.0–0.7)
Eosinophils Relative: 8.3 % — ABNORMAL HIGH (ref 0.0–5.0)
HEMATOCRIT: 43.9 % (ref 36.0–46.0)
Hemoglobin: 14.9 g/dL (ref 12.0–15.0)
LYMPHS PCT: 29.4 % (ref 12.0–46.0)
Lymphs Abs: 2.6 10*3/uL (ref 0.7–4.0)
MCHC: 33.8 g/dL (ref 30.0–36.0)
MCV: 89.9 fl (ref 78.0–100.0)
Monocytes Absolute: 0.5 10*3/uL (ref 0.1–1.0)
Monocytes Relative: 5.6 % (ref 3.0–12.0)
NEUTROS ABS: 4.9 10*3/uL (ref 1.4–7.7)
Neutrophils Relative %: 55.7 % (ref 43.0–77.0)
PLATELETS: 274 10*3/uL (ref 150.0–400.0)
RBC: 4.89 Mil/uL (ref 3.87–5.11)
RDW: 14.2 % (ref 11.5–15.5)
WBC: 8.8 10*3/uL (ref 4.0–10.5)

## 2018-02-25 LAB — COMPREHENSIVE METABOLIC PANEL
ALT: 20 U/L (ref 0–35)
AST: 18 U/L (ref 0–37)
Albumin: 4.2 g/dL (ref 3.5–5.2)
Alkaline Phosphatase: 107 U/L (ref 39–117)
BILIRUBIN TOTAL: 0.6 mg/dL (ref 0.2–1.2)
BUN: 20 mg/dL (ref 6–23)
CALCIUM: 9 mg/dL (ref 8.4–10.5)
CO2: 31 meq/L (ref 19–32)
CREATININE: 0.79 mg/dL (ref 0.40–1.20)
Chloride: 102 mEq/L (ref 96–112)
GFR: 79.09 mL/min (ref >60.00)
GLUCOSE: 95 mg/dL (ref 70–99)
Potassium: 4 mEq/L (ref 3.5–5.1)
SODIUM: 139 meq/L (ref 135–145)
Total Protein: 7 g/dL (ref 6.0–8.3)

## 2018-02-25 LAB — LIPID PANEL
CHOL/HDL RATIO: 4
Cholesterol: 248 mg/dL — ABNORMAL HIGH (ref 0–200)
HDL: 65 mg/dL (ref >39.00)
LDL Cholesterol: 161 mg/dL — ABNORMAL HIGH (ref 0–99)
NONHDL: 183.05
TRIGLYCERIDES: 108 mg/dL (ref 0.0–149.0)
VLDL: 21.6 mg/dL (ref 0.0–40.0)

## 2018-02-25 LAB — VITAMIN D 25 HYDROXY (VIT D DEFICIENCY, FRACTURES): VITD: 45.4 ng/mL (ref 30.00–100.00)

## 2018-02-25 NOTE — Patient Instructions (Signed)
Please return in 12 months for your annual complete physical; please come fasting.  Try melatonin 3-5mg  nightly for sleep.  Calm app, deep breathing exercises. Start Vit D 4000 units daily.  If you have any questions or concerns, please don't hesitate to send me a message via MyChart or call the office at [phone]. Thank you for visiting with [place] today! It's our pleasure caring for you.  Recommendations for women to keep healthy:   EXERCISE AND DIET: We recommended that you start or continue a regular exercise program for good health. Regular exercise means any activity that makes your heart beat faster and makes you sweat. We recommend exercising at least 30 minutes per day at least 3 days a week, preferably 4 or 5. We also recommend a diet low in fat and sugar. Inactivity, poor dietary choices and obesity can cause diabetes, heart attack, stroke, and kidney damage, among others.   ALCOHOL AND SMOKING: Women should limit their alcohol intake to no more than 7 drinks/beers/glasses of wine (combined, not each!) per week. Moderation of alcohol intake to this level decreases your risk of breast cancer and liver damage. And of course, no recreational drugs are part of a healthy lifestyle. And absolutely no smoking or even second hand smoke. Most people know smoking can cause heart and lung diseases, but did you know it also contributes to weakening of your bones? Aging of your skin? Yellowing of your teeth and nails?  CALCIUM AND VITAMIN D: Adequate intake of calcium and Vitamin D are recommended. The recommendations for exact amounts of these supplements seem to change often, but generally speaking 600 mg of calcium (either carbonate or citrate) and 800 units of Vitamin D per day seems prudent. Certain women may benefit from higher intake of Vitamin D. If you are among these women, your doctor will have told you during your visit.   PAP SMEARS: Pap smears, to check for cervical cancer or precancers,  have traditionally been done yearly, although recent scientific advances have shown that most women can have pap smears less often. However, every woman still should have a physical exam from her gynecologist every year. It will include a breast check, inspection of the vulva and vagina to check for abnormal growths or skin changes, a visual exam of the cervix, and then an exam to evaluate the size and shape of the uterus and ovaries. And after 59 years of age, a rectal exam is indicated to check for rectal cancers. We will also provide age appropriate advice regarding health maintenance, like when you should have certain vaccines, screening for sexually transmitted diseases, bone density testing, colonoscopy, mammograms, etc.   MAMMOGRAMS: All women over 72 years old should have a yearly mammogram. Many facilities now offer a "3D" mammogram, which may cost around $50 extra out of pocket. If possible, we recommend you accept the option to have the 3D mammogram performed. It both reduces the number of women who will be called back for extra views which then turn out to be normal, and it is better than the routine mammogram at detecting truly abnormal areas.   COLONOSCOPY: Colonoscopy to screen for colon cancer is recommended for all women at age 38. We know, you hate the idea of the prep. We agree, BUT, having colon cancer and not knowing it is worse!! Colon cancer so often starts as a polyp that can be seen and removed at colonscopy, which can quite literally save your life! And if your first colonoscopy is  normal and you have no family history of colon cancer, most women don't have to have it again for 10 years. Once every ten years, you can do something that may end up saving your life, right? We will be happy to help you get it scheduled when you are ready. Be sure to check your insurance coverage so you understand how much it will cost. It may be covered as a preventative service at no cost, but you should  check your particular policy.

## 2018-02-25 NOTE — Progress Notes (Signed)
Subjective  Chief Complaint  Patient presents with  . Annual Exam    doing well, no complaints. Had Gyn visit May 2019     HPI: Teresa Wells is a 57 y.o. female who presents to [place] at [facility] today for a Female Wellness Visit.   Wellness Visit: annual visit with health maintenance review and exam with Pap   Doing well. No respiratory concerns. Mild allergies. Due flu and pneumovax (due to h/o ARF due to pneumonia).   Female wellness normal  Reviewed records from wellness weight loss visits. She continues to work on her weight. Had vit D defic replaced.   Insomnia: work related (can't shut brain off) using ambien 2-3 x / week. Hasn't tried other meds.   Assessment  1. Annual physical exam   2. Vitamin D deficiency   3. Primary insomnia      Plan  Female Wellness Visit:  Age appropriate Health Maintenance and Prevention measures were discussed with patient. Included topics are cancer screening recommendations, ways to keep healthy (see AVS) including dietary and exercise recommendations, regular eye and dental care, use of seat belts, and avoidance of moderate alcohol use and tobacco use. Screens are up to date.   BMI: discussed patient's BMI and encouraged positive lifestyle modifications to help get to or maintain a target BMI.  HM needs and immunizations were addressed and ordered. See below for orders. See HM and immunization section for updates. Flu and pneumovax  Routine labs and screening tests ordered including cmp, cbc and lipids where appropriate.  Discussed recommendations regarding Vit D and calcium supplementation (see AVS)  Insomnia: trial of melatonin encouraged. Decrease ambien. Consider elavil if melatonin not helpful. Discussed sleep hygiene and deep breathing exercises.   Follow up: Return in about 1 year (around 02/26/2019) for complete physical.   Orders Placed This Encounter  Procedures  . Pneumococcal polysaccharide  vaccine 23-valent greater than or equal to 2yo subcutaneous/IM  . CBC with Differential/Platelet  . Comprehensive metabolic panel  . Lipid panel  . VITAMIN D 25 Hydroxy (Vit-D Deficiency, Fractures)   No orders of the defined types were placed in this encounter.    Lifestyle: Body mass index is 31.97 kg/m. Wt Readings from Last 3 Encounters:  02/25/18 174 lb 12.8 oz (79.3 kg)  01/02/18 169 lb (76.7 kg)  12/26/17 172 lb (78 kg)     Patient Active Problem List   Diagnosis Date Noted  . Annual physical exam 02/25/2018  . Obesity with body mass index 30 or greater 07/10/2017  . 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 Insomnia 10/03/2015    Nortriptyline at hs Zolpidem prn  Potential benefits of a long term benzodiazepines  use as well as potential risks  and complications were explained to the patient and were aknowledged.   . Seasonal allergies 05/29/2012   Health Maintenance  Topic Date Due  . INFLUENZA VACCINE  11/28/2017  . MAMMOGRAM  08/07/2018  . TETANUS/TDAP  11/04/2021  . COLONOSCOPY  03/10/2022  . PAP SMEAR  08/29/2022  . Hepatitis C Screening  Completed  . HIV Screening  Completed   Immunization History  Administered Date(s) Administered  . Influenza Split 01/28/2017  . Influenza-Unspecified 02/15/2014, 03/18/2015  . Tdap 11/05/2011   We updated and reviewed the patient's past history in detail and it is documented below. Allergies: Patient is allergic to sulfasalazine; oxycodone; penicillins; and sulfa antibiotics. Past Medical History Patient  has a past medical history of Acute meniscal  tear of knee (LEFT), Allergy, Anemia, Chronic respiratory failure with hypoxia ([place]) (07/26/2017), Constipation, and Insomnia. Past Surgical History Patient  has a past surgical history that includes Bilateral salpingoophorectomy (2009); Hysteroscopy with resectoscope (2006); Knee arthroscopy (05/04/2011); Meniscus debridement (05/04/2011); Breast fibroadenoma surgery (2001);  Incisional hernia repair (N/A, 11/12/2013); Insertion of mesh (N/A, 11/12/2013); Mole removal (Left, 11/12/2013); Open reduction internal fixation (orif) distal radial fracture (Left, 08/27/2014); and Carpal tunnel release (Left, 08/27/2014). Family History: Patient family history includes Anxiety disorder in her mother; Depression in her mother; Diabetes in her father and mother; Heart disease in her brother, father, and mother; Hyperlipidemia in her father and mother; Hypertension in her brother and father; Obesity in her mother; Sleep apnea in her mother; Stroke in her father. Social History:  Patient  reports that she has never smoked. She has never used smokeless tobacco. She reports that she does not drink alcohol or use drugs.  Review of Systems: Constitutional: negative for fever or malaise Ophthalmic: negative for photophobia, double vision or loss of vision Cardiovascular: negative for chest pain, dyspnea on exertion, or new LE swelling Respiratory: negative for SOB or persistent cough Gastrointestinal: negative for abdominal pain, change in bowel habits or melena Genitourinary: negative for dysuria or gross hematuria, no abnormal uterine bleeding or disharge Musculoskeletal: negative for new gait disturbance or muscular weakness Integumentary: negative for new or persistent rashes, no breast lumps Neurological: negative for TIA or stroke symptoms Psychiatric: negative for SI or delusions Allergic/Immunologic: negative for hives  Patient Care Team    Relationship Specialty Notifications Start End  Leamon Arnt, MD PCP - General Family Medicine  06/26/17   Delsa Bern, MD Consulting Physician Obstetrics and Gynecology  09/03/17   Melvenia Needles, NP Nurse Practitioner Pulmonary Disease  09/03/17     Objective  Vitals: BP 124/80   Pulse 81   Temp 97.9 F (36.6 C)   Ht 5\' 2"  (1.575 m)   Wt 174 lb 12.8 oz (79.3 kg)   BMI 31.97 kg/m  General:  Well developed, well nourished, no  acute distress  Psych:  Alert and orientedx3,normal mood and affect HEENT:  Normocephalic, atraumatic, non-icteric sclera, PERRL, oropharynx is clear without mass or exudate, supple neck without adenopathy, mass or thyromegaly Cardiovascular:  Normal S1, S2, RRR without gallop, rub or murmur, nondisplaced PMI Respiratory:  Good breath sounds bilaterally, CTAB with normal respiratory effort Gastrointestinal: normal bowel sounds, soft, non-tender, no noted masses. No HSM MSK: no deformities, contusions. Joints are without erythema or swelling. Spine and CVA region are nontender Skin:  Warm, no rashes or suspicious lesions noted Neurologic:    Mental status is normal. CN 2-11 are normal. Gross motor and sensory exams are normal. Normal gait. No tremor No visits with results within 1 Day(s) from this visit.  Latest known visit with results is:  Office Visit on 10/03/2017  Component Date Value Ref Range Status  . WBC 10/03/2017 8.2  3.4 - 10.8 x10E3/uL Final  . RBC 10/03/2017 4.91  3.77 - 5.28 x10E6/uL Final  . Hemoglobin 10/03/2017 14.8  11.1 - 15.9 g/dL Final  . Hematocrit 10/03/2017 44.1  34.0 - 46.6 % Final  . MCV 10/03/2017 90  79 - 97 fL Final  . MCH 10/03/2017 30.1  26.6 - 33.0 pg Final  . MCHC 10/03/2017 33.6  31.5 - 35.7 g/dL Final  . RDW 10/03/2017 14.9  12.3 - 15.4 % Final  . Platelets 10/03/2017 315  150 - 450 x10E3/uL Final  .  Neutrophils 10/03/2017 56  Not Estab. % Final  . Lymphs 10/03/2017 32  Not Estab. % Final  . Monocytes 10/03/2017 9  Not Estab. % Final  . Eos 10/03/2017 2  Not Estab. % Final  . Basos 10/03/2017 1  Not Estab. % Final  . Neutrophils Absolute 10/03/2017 4.6  1.4 - 7.0 x10E3/uL Final  . Lymphocytes Absolute 10/03/2017 2.6  0.7 - 3.1 x10E3/uL Final  . Monocytes Absolute 10/03/2017 0.8  0.1 - 0.9 x10E3/uL Final  . EOS (ABSOLUTE) 10/03/2017 0.2  0.0 - 0.4 x10E3/uL Final  . Basophils Absolute 10/03/2017 0.1  0.0 - 0.2 x10E3/uL Final  . Immature Granulocytes  10/03/2017 0  Not Estab. % Final  . Immature Grans (Abs) 10/03/2017 0.0  0.0 - 0.1 x10E3/uL Final  . Glucose 10/03/2017 82  65 - 99 mg/dL Final  . BUN 10/03/2017 12  6 - 24 mg/dL Final  . Creatinine, Ser 10/03/2017 0.73  0.57 - 1.00 mg/dL Final  . GFR calc non Af Amer 10/03/2017 91  >59 mL/min/1.73 Final  . GFR calc Af Amer 10/03/2017 105  >59 mL/min/1.73 Final  . BUN/Creatinine Ratio 10/03/2017 16  9 - 23 Final  . Sodium 10/03/2017 139  134 - 144 mmol/L Final  . Potassium 10/03/2017 4.2  3.5 - 5.2 mmol/L Final  . Chloride 10/03/2017 101  96 - 106 mmol/L Final  . CO2 10/03/2017 24  20 - 29 mmol/L Final  . Calcium 10/03/2017 9.1  8.7 - 10.2 mg/dL Final  . Total Protein 10/03/2017 6.9  6.0 - 8.5 g/dL Final  . Albumin 10/03/2017 4.4  3.5 - 5.5 g/dL Final  . Globulin, Total 10/03/2017 2.5  1.5 - 4.5 g/dL Final  . Albumin/Globulin Ratio 10/03/2017 1.8  1.2 - 2.2 Final  . Bilirubin Total 10/03/2017 0.6  0.0 - 1.2 mg/dL Final  . Alkaline Phosphatase 10/03/2017 101  39 - 117 IU/L Final  . AST 10/03/2017 27  0 - 40 IU/L Final  . ALT 10/03/2017 27  0 - 32 IU/L Final  . Hgb A1c MFr Bld 10/03/2017 5.2  4.8 - 5.6 % Final  . Est. average glucose Bld gHb Est-m* 10/03/2017 103  mg/dL Final  . INSULIN 10/03/2017 8.4  2.6 - 24.9 uIU/mL Final  . Cholesterol, Total 10/03/2017 267* 100 - 199 mg/dL Final  . Triglycerides 10/03/2017 115  0 - 149 mg/dL Final  . HDL 10/03/2017 73  >39 mg/dL Final  . VLDL Cholesterol Cal 10/03/2017 23  5 - 40 mg/dL Final  . LDL Calculated 10/03/2017 171* 0 - 99 mg/dL Final  . Chol/HDL Ratio 10/03/2017 3.7  0.0 - 4.4 ratio Final  . Vit D, 25-Hydroxy 10/03/2017 34.4  30.0 - 100.0 ng/mL Final  . Vitamin B-12 10/03/2017 438  232 - 1,245 pg/mL Final  . Folate 10/03/2017 4.4  >3.0 ng/mL Final  . T3, Total 10/03/2017 129  71 - 180 ng/dL Final  . Free T4 10/03/2017 0.97  0.82 - 1.77 ng/dL Final  . TSH 10/03/2017 4.360  0.450 - 4.500 uIU/mL Final     Commons side effects, risks,  benefits, and alternatives for medications and treatment plan prescribed today were discussed, and the patient expressed understanding of the given instructions. Patient is instructed to call or message via MyChart if he/she has any questions or concerns regarding our treatment plan. No barriers to understanding were identified. We discussed Red Flag symptoms and signs in detail. Patient expressed understanding regarding what to do in case of urgent  or emergency type symptoms.   Medication list was reconciled, printed and provided to the patient in AVS. Patient instructions and summary information was reviewed with the patient as documented in the AVS. This note was prepared with assistance of Dragon voice recognition software. Occasional wrong-word or sound-a-like substitutions may have occurred due to the inherent limitations of voice recognition software

## 2018-03-07 ENCOUNTER — Telehealth: Payer: Self-pay | Admitting: Emergency Medicine

## 2018-03-07 MED ORDER — AZITHROMYCIN 250 MG PO TABS: ORAL_TABLET | ORAL | 0 refills | Status: DC

## 2018-03-07 NOTE — Telephone Encounter (Signed)
Reason for CRM: Patient called to request that the doctor send her in a Z-pak because she said that she is stopped up, achy and congested with a cough.  Patient did not want to schedule an appointment at this time.  Please advise

## 2018-03-07 NOTE — Addendum Note (Signed)
Addended by: Billey Chang on: 03/07/2018 11:57 AM   Modules accepted: Orders

## 2018-03-07 NOTE — Telephone Encounter (Signed)
Ordered zpak. Office visit if not improving  Thanks.

## 2018-03-07 NOTE — Telephone Encounter (Signed)
Patient informed of RX called in. Advised to f/u in office if she is not improving   Doloris Hall,  LPN

## 2018-03-21 ENCOUNTER — Ambulatory Visit: Payer: 59 | Admitting: Family Medicine

## 2018-03-21 ENCOUNTER — Other Ambulatory Visit: Payer: Self-pay

## 2018-03-21 ENCOUNTER — Encounter: Payer: Self-pay | Admitting: Family Medicine

## 2018-03-21 VITALS — BP 134/82 | HR 79 | Temp 97.6°F | Ht 62.0 in | Wt 175.2 lb

## 2018-03-21 DIAGNOSIS — J0100 Acute maxillary sinusitis, unspecified: Secondary | ICD-10-CM | POA: Diagnosis not present

## 2018-03-21 MED ORDER — CEFUROXIME AXETIL 500 MG PO TABS: 500.0000 mg | ORAL_TABLET | Freq: Two times a day (BID) | ORAL | 0 refills | Status: DC

## 2018-03-21 MED ORDER — PREDNISONE 20 MG PO TABS: ORAL_TABLET | ORAL | 0 refills | Status: DC

## 2018-03-21 NOTE — Patient Instructions (Signed)
Please follow up if symptoms do not improve or as needed.   You may use Delsym cough syrup or Mucinex DM to help with congestion and coughing.  Take the antibiotic until it is gone.  Prednisone should help with the sinus pressure and cough.

## 2018-03-21 NOTE — Progress Notes (Signed)
_    Subjective   CC:  Chief Complaint  Patient presents with  . Cough    productive cough, green/yellowish mucus x 2 weeks     HPI: Teresa Wells is a 57 y.o. female who presents to the office today to address the problems listed above in the chief complaint.  Patient reports sinus congestion and pressure with thick drainage, mild nonproductive cough, ear pressure without pain, and mild malaise.  Symptoms have been present for several weeks. Was treated with a zpak two weeks ago - but sxs persist and now with sinus pain. She worries about pneumonia due to her bout with pneumonitis and ARF earlier this year .but no sob or wheezing now.Shedenies high fevers, GI symptoms, shortness of breath. Shehas had sinus infections in the past and this feels similar.  Patient is a non-smoker.  No history of asthma or COPD.  I reviewed the patients updated PMH, FH, and SocHx.    Patient Active Problem List   Diagnosis Date Noted  . Annual physical exam 02/25/2018  . Obesity with body mass index 30 or greater 07/10/2017  . 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 Insomnia 10/03/2015  . Seasonal allergies 05/29/2012   Current Meds  Medication Sig  . albuterol (PROVENTIL HFA;VENTOLIN HFA) 108 (90 Base) MCG/ACT inhaler INHALE 2 PUFFS INTO THE LUNGS EVERY 4 (FOUR) HOURS AS NEEDED FOR WHEEZING OR SHORTNESS OF BREATH.  . cetirizine (ZYRTEC) 10 MG tablet Take 10 mg by mouth daily. Reported on 10/03/2015  . fluticasone (FLONASE) 50 MCG/ACT nasal spray Place 2 sprays into both nostrils daily.  Marland Kitchen zolpidem (AMBIEN) 5 MG tablet TAKE 1 TABLET (5 MG TOTAL) BY MOUTH AT BEDTIME AS NEEDED FOR SLEEP.  . [DISCONTINUED] azithromycin (ZITHROMAX) 250 MG tablet Take 2 tabs today, then 1 tab daily for 4 days    Review of Systems: Cardiovascular: negative for chest pain Respiratory: negative for SOB or persistent cough Gastrointestinal: negative for abdominal pain Genitourinary: negative for dysuria or gross  hematuria  Objective  Vitals: BP 134/82   Pulse 79   Temp 97.6 F (36.4 C)   Ht 5\' 2"  (1.575 m)   Wt 175 lb 3.2 oz (79.5 kg)   SpO2 99%   BMI 32.04 kg/m  General: no acute distress  Psych:  Alert and oriented, normal mood and affect HEENT:  Normocephalic, atraumatic, TMs with serous effusions or retraction w/o erythema, nasal mucosa is red with purulent drainage, tender maxillary sinus present, OP mild erythematous w/o eudate, supple neck without LAD Cardiovascular:  RRR without murmur or gallop. no peripheral edema Respiratory:  Good breath sounds bilaterally, CTAB with normal respiratory effort Skin:  Warm, no rashes Neurologic:   Mental status is normal. normal gait  Assessment  1. Acute non-recurrent maxillary sinusitis      Plan    Sinusitis: History and exam is most consistent with bacterial sinus infection.  Etiology and prognosis discussed with patient.  Recommend antibiotics as ordered below.  Patient to complete course of antibiotics, use supportive medications like mucolytics and decongestants as needed.  May use Tylenol or Advil if needed.  Symptoms should improve over the next 2 weeks.  Patient will return or call if symptoms persist or worsen.  Follow up: prn    Commons side effects, risks, benefits, and alternatives for medications and treatment plan prescribed today were discussed, and the patient expressed understanding of the given instructions. Patient is instructed to call or message via MyChart if he/she has any  questions or concerns regarding our treatment plan. No barriers to understanding were identified. We discussed Red Flag symptoms and signs in detail. Patient expressed understanding regarding what to do in case of urgent or emergency type symptoms.   Medication list was reconciled, printed and provided to the patient in AVS. Patient instructions and summary information was reviewed with the patient as documented in the AVS. This note was prepared with  assistance of Dragon voice recognition software. Occasional wrong-word or sound-a-like substitutions may have occurred due to the inherent limitations of voice recognition software  No orders of the defined types were placed in this encounter.  Meds ordered this encounter  Medications  . cefUROXime (CEFTIN) 500 MG tablet    Sig: Take 1 tablet (500 mg total) by mouth 2 (two) times daily with a meal.    Dispense:  20 tablet    Refill:  0  . predniSONE (DELTASONE) 20 MG tablet    Sig: Take 3 tabs daily for 5 days    Dispense:  15 tablet    Refill:  0

## 2018-04-14 ENCOUNTER — Other Ambulatory Visit: Payer: Self-pay | Admitting: Emergency Medicine

## 2018-04-14 MED ORDER — ZOLPIDEM TARTRATE 5 MG PO TABS: 5.0000 mg | ORAL_TABLET | Freq: Every evening | ORAL | 5 refills | Status: DC | PRN

## 2018-04-14 NOTE — Telephone Encounter (Signed)
Prescription is for Ambien 5 mg. Prescription is pended.   Doloris Hall,  LPN

## 2018-04-14 NOTE — Telephone Encounter (Signed)
Last OV: 03/21/2018 Last Fill: 10/15/2017, #30 with 5 RF

## 2018-04-14 NOTE — Telephone Encounter (Signed)
There is no information here.

## 2018-04-14 NOTE — Addendum Note (Signed)
Addended by: Billey Chang on: 04/14/2018 03:59 PM   Modules accepted: Orders

## 2018-04-14 NOTE — Addendum Note (Signed)
Addended bySigurd Sos on: 04/14/2018 03:56 PM   Modules accepted: Orders

## 2018-07-20 IMAGING — DX DG CHEST 2V
2 series · 2 of 2 positions shown · non-contrast
Comparison: 07/05/2017

CLINICAL DATA: Follow-up pneumonia

EXAM:
CHEST - 2 VIEW

[chest pa]
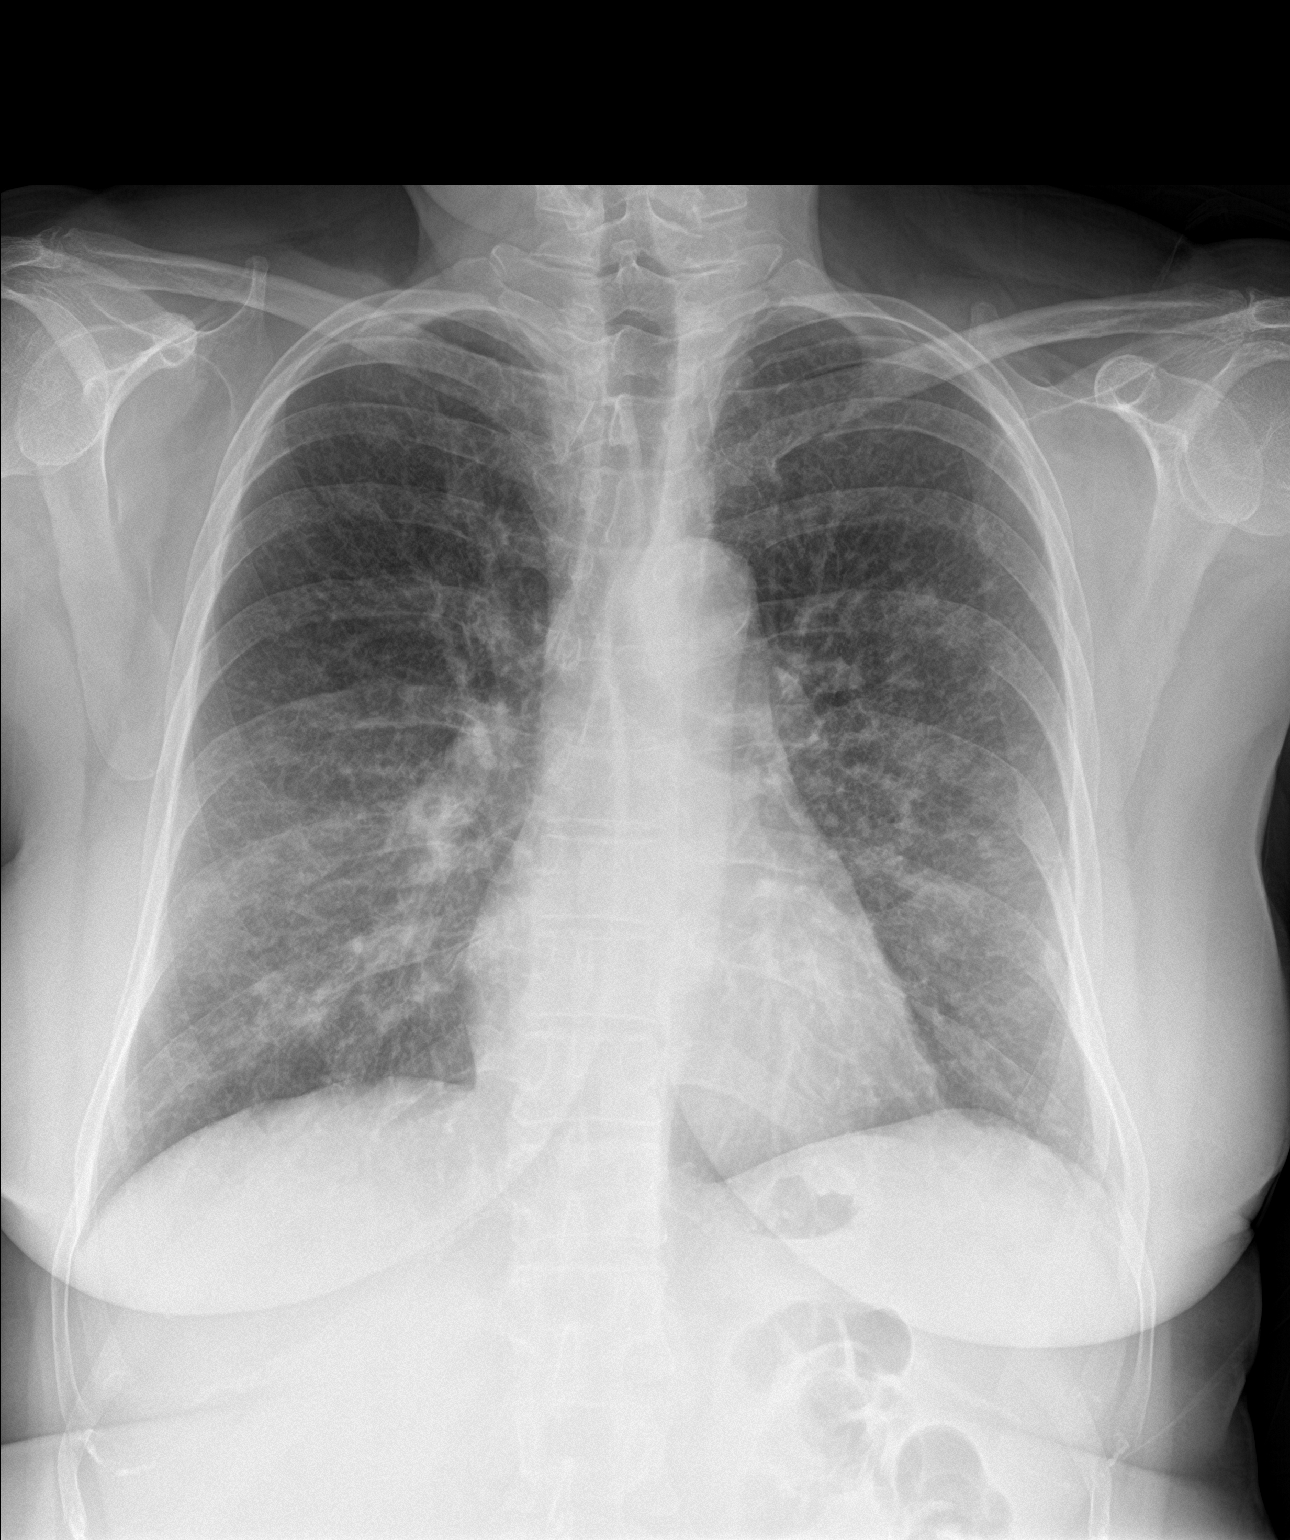

[chest lat]
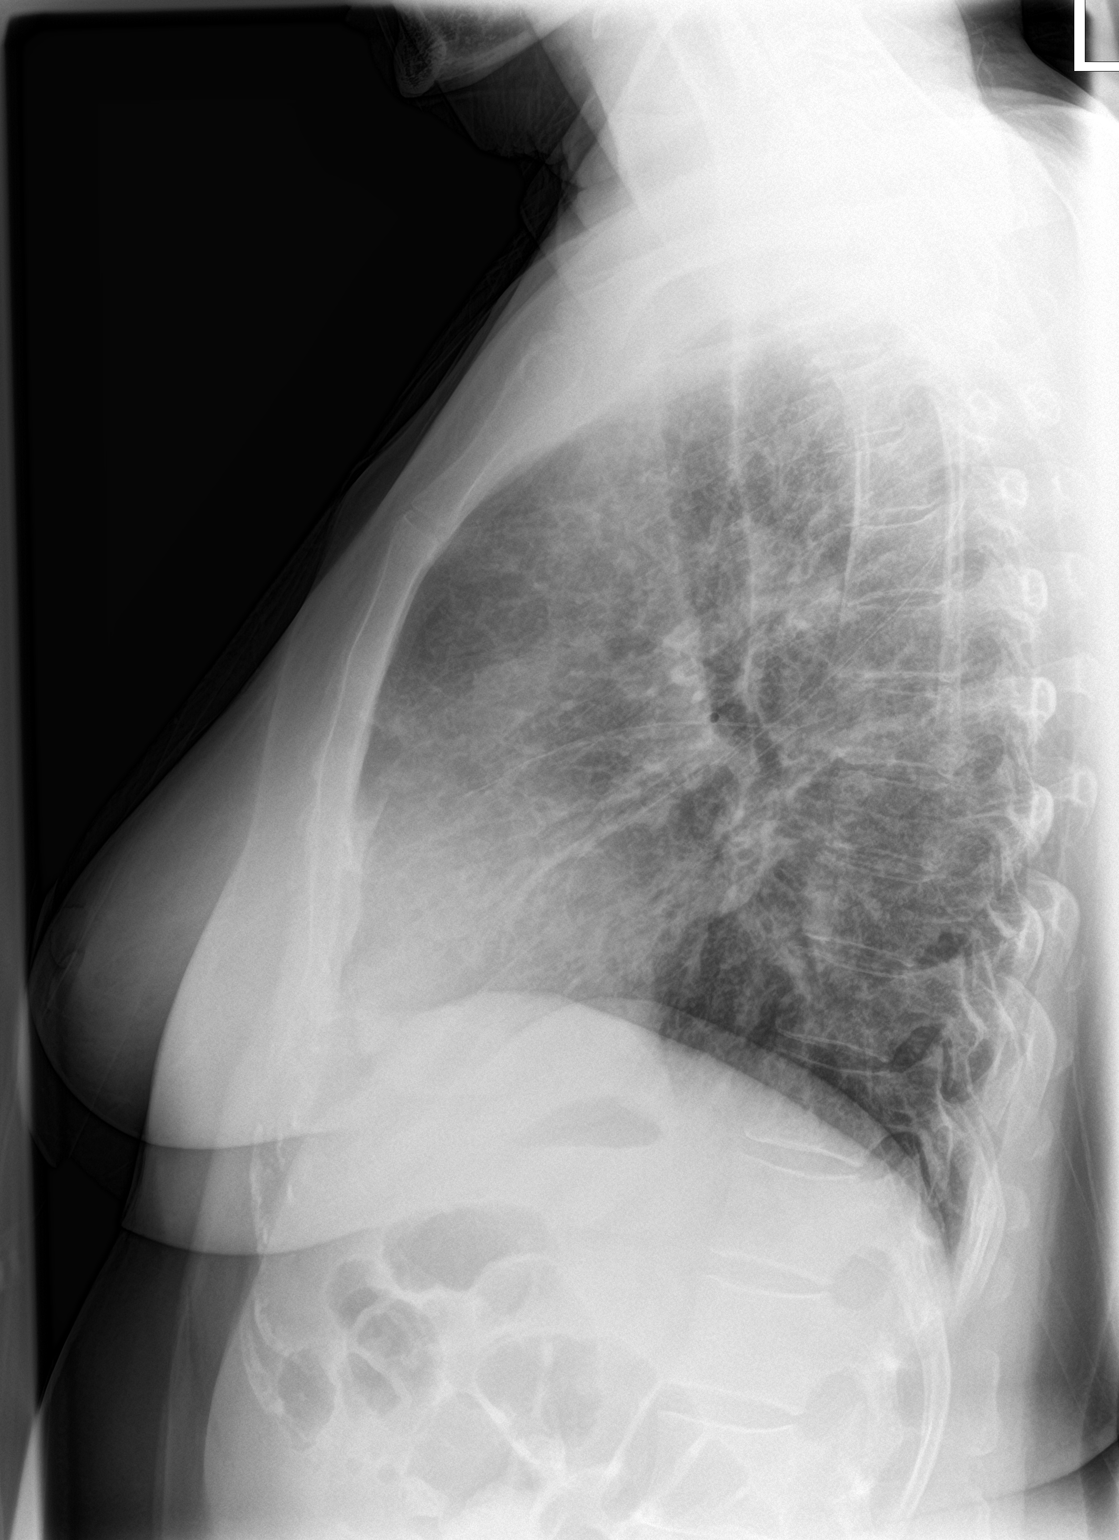

[2 of 2 positions shown; findings below may reference images not displayed]

FINDINGS: Heart size remains normal. Aortic calcification again seen.
Widespread patchy pulmonary infiltrates persist, with some of the
patchy densities being more prominent than previously seen on the
last exam, but not as abnormal as the study of 07/02/2017. No lobar
consolidation or collapse. No effusions. Bony structures
unremarkable.
IMPRESSION: Persistent bilateral patchy pulmonary infiltrates, slightly worsened
when compared to the study of 07/05/2017 but not as pronounced as
the study of 07/02/2017.

## 2018-08-08 IMAGING — DX DG CHEST 2V
2 series · 2 of 2 positions shown · non-contrast
Comparison: Radiographs July 26, 2017.

CLINICAL DATA: Follow-up pneumonia.

EXAM:
CHEST - 2 VIEW

[chest pa]
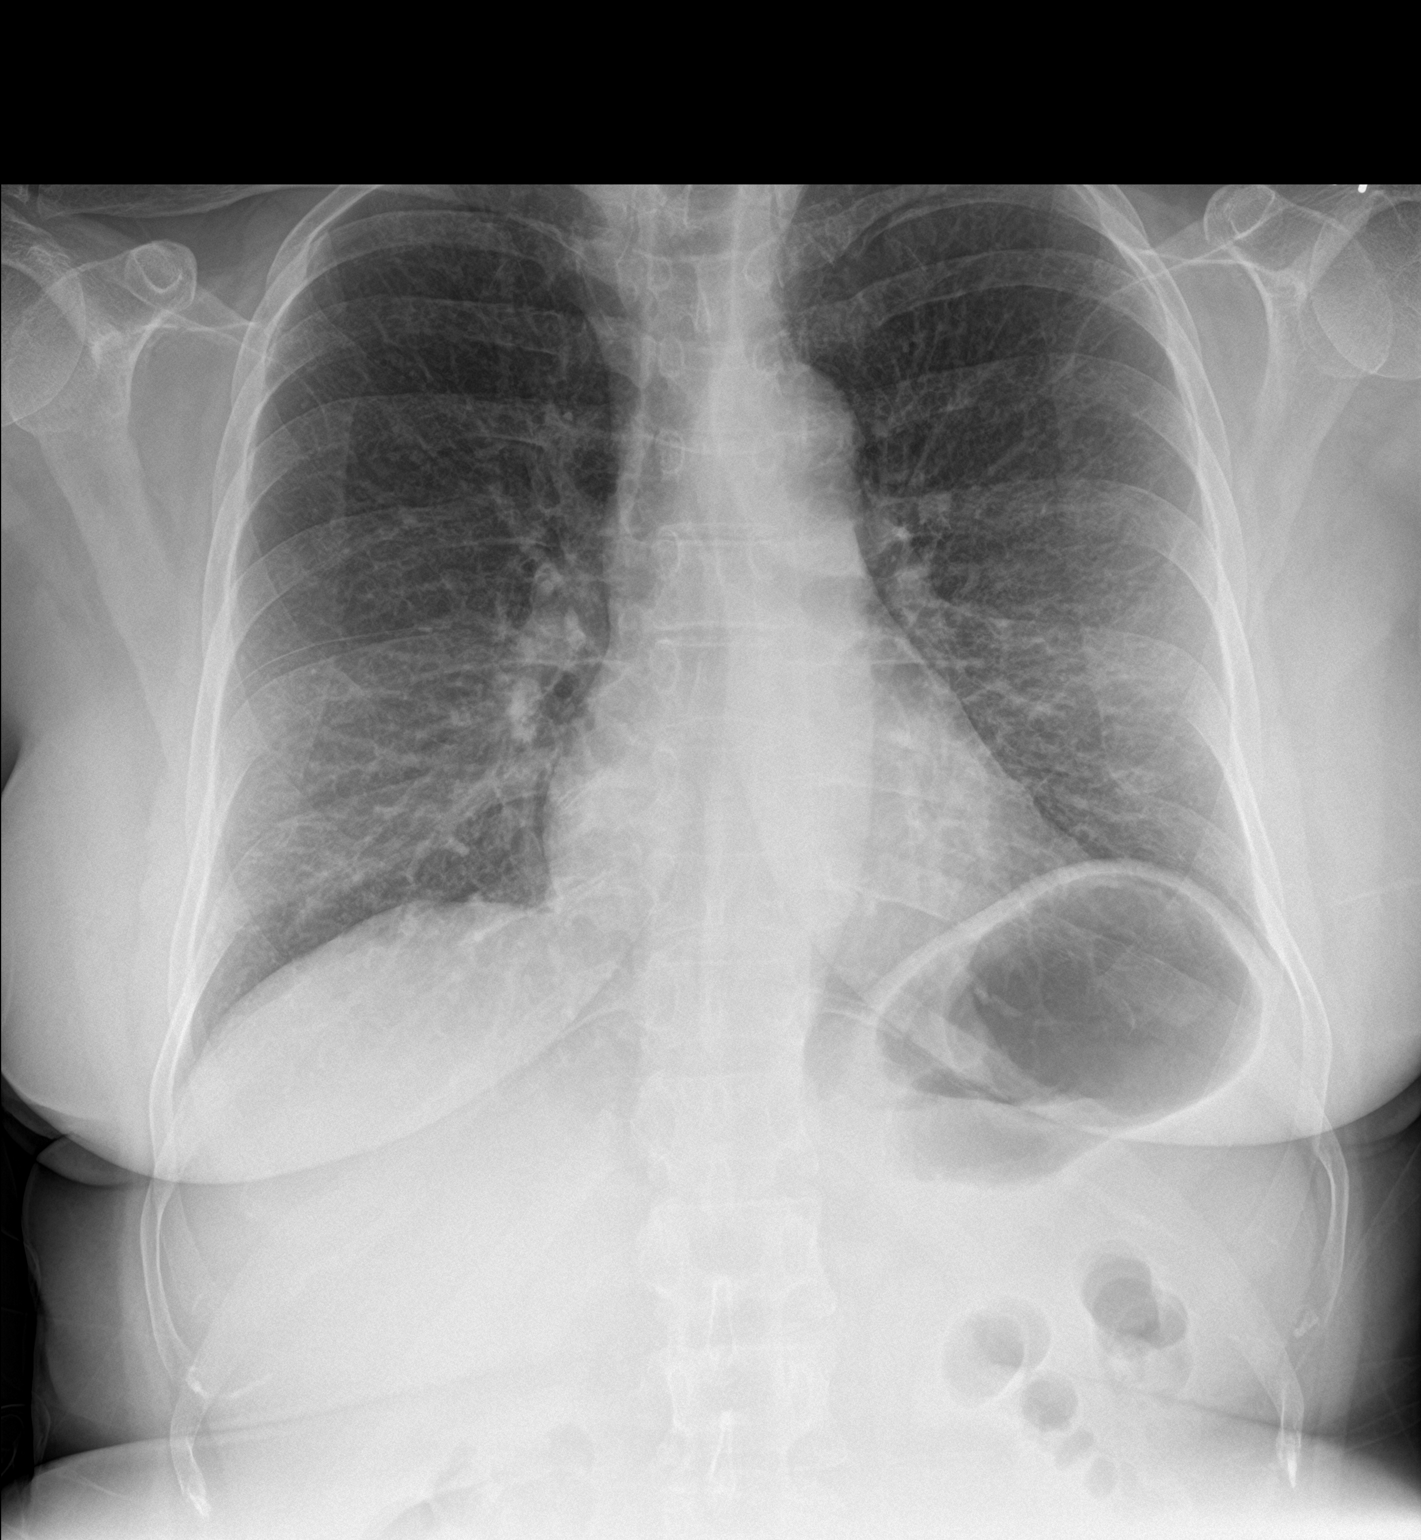

[chest lat]
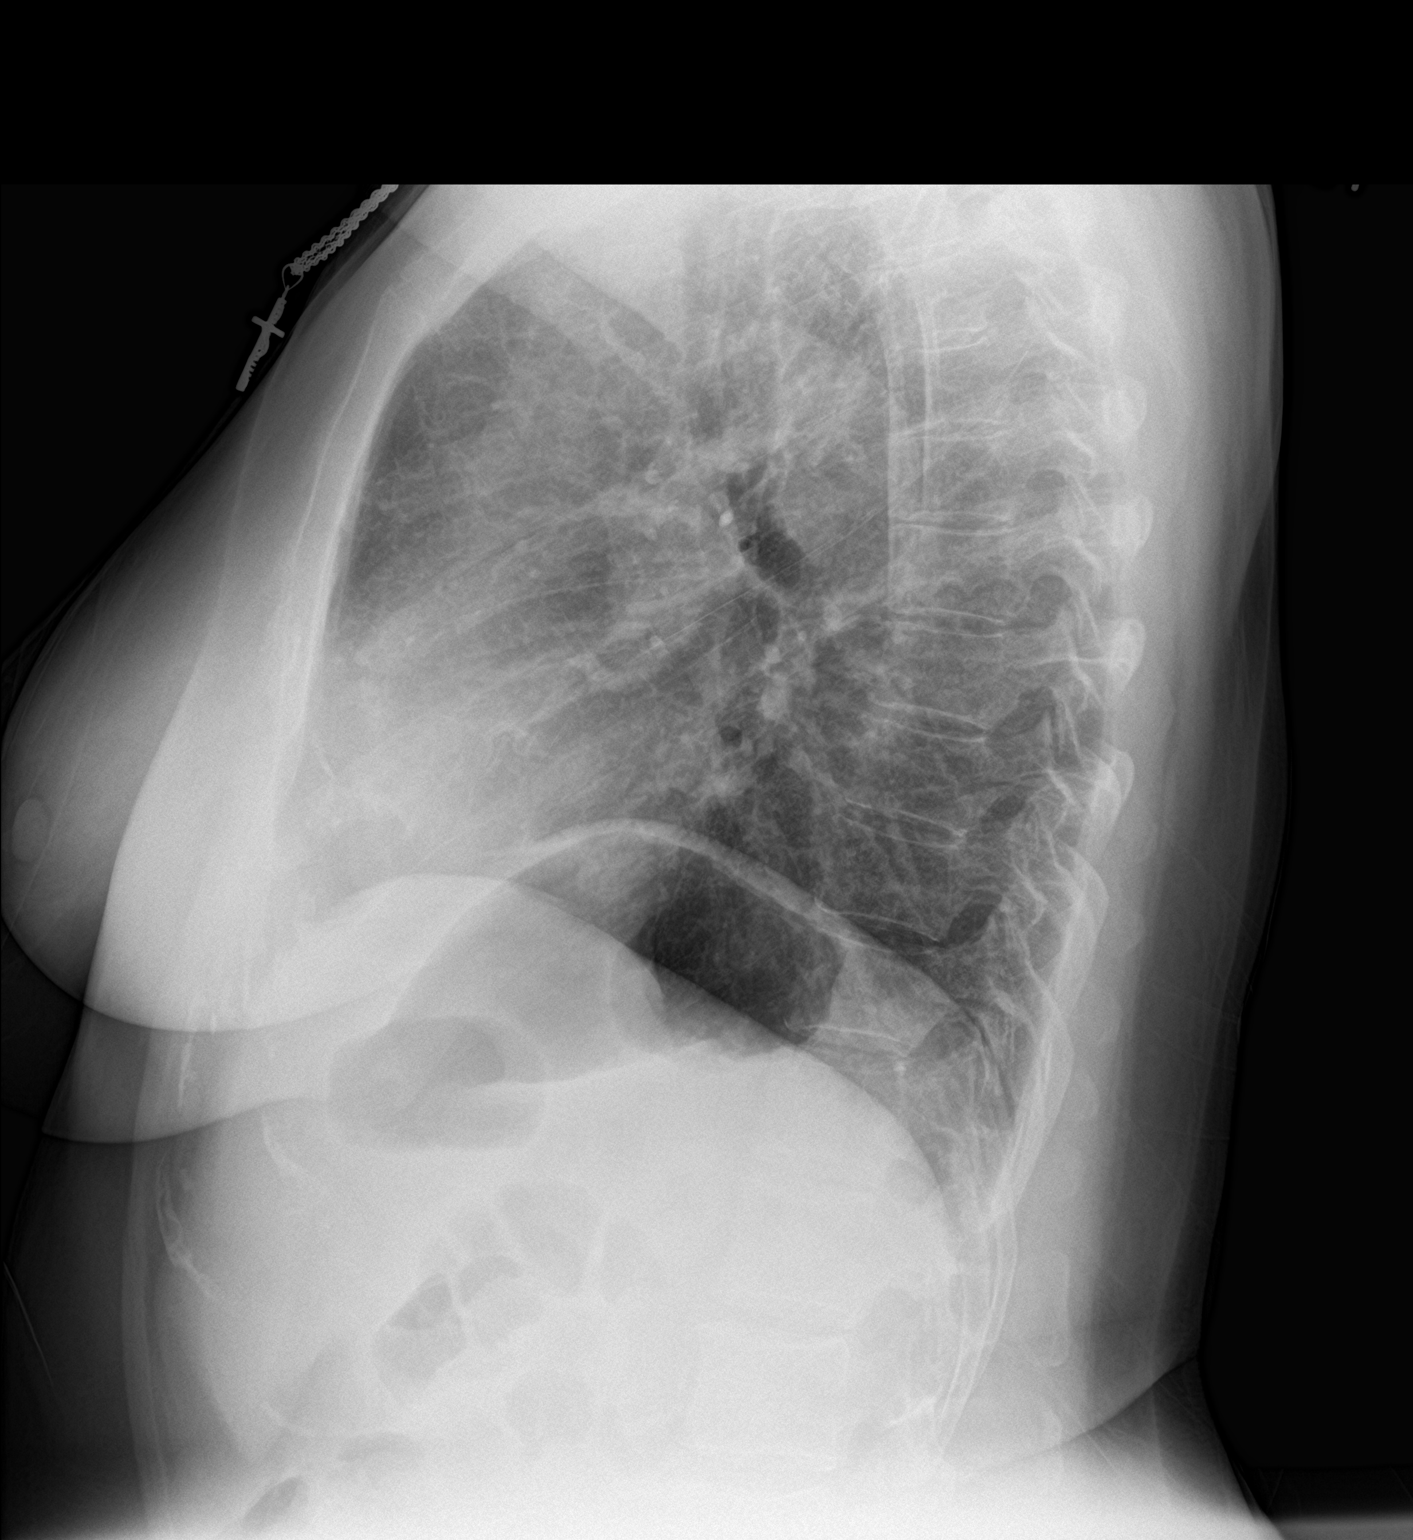

[2 of 2 positions shown; findings below may reference images not displayed]

FINDINGS: The heart size and mediastinal contours are within normal limits.
Both lungs are clear. No pneumothorax or pleural effusion is noted.
The visualized skeletal structures are unremarkable.
IMPRESSION: No active cardiopulmonary disease.

## 2018-08-21 ENCOUNTER — Encounter: Payer: Self-pay | Admitting: Family Medicine

## 2018-08-22 ENCOUNTER — Other Ambulatory Visit: Payer: Self-pay | Admitting: Family Medicine

## 2018-08-22 ENCOUNTER — Ambulatory Visit (INDEPENDENT_AMBULATORY_CARE_PROVIDER_SITE_OTHER): Payer: 59 | Admitting: Family Medicine

## 2018-08-22 ENCOUNTER — Encounter: Payer: Self-pay | Admitting: Family Medicine

## 2018-08-22 ENCOUNTER — Other Ambulatory Visit: Payer: Self-pay

## 2018-08-22 DIAGNOSIS — H60331 Swimmer's ear, right ear: Secondary | ICD-10-CM | POA: Diagnosis not present

## 2018-08-22 MED ORDER — NEOMYCIN-POLYMYXIN-HC 3.5-10000-1 OT SOLN: 4.0000 [drp] | Freq: Four times a day (QID) | OTIC | 0 refills | Status: DC

## 2018-08-22 MED ORDER — CIPROFLOXACIN-DEXAMETHASONE 0.3-0.1 % OT SUSP: 4.0000 [drp] | Freq: Two times a day (BID) | OTIC | 0 refills | Status: DC

## 2018-08-22 MED ORDER — TRAMADOL HCL 50 MG PO TABS: 50.0000 mg | ORAL_TABLET | Freq: Three times a day (TID) | ORAL | 0 refills | Status: AC | PRN

## 2018-08-22 NOTE — Progress Notes (Signed)
Virtual Visit via Video Note  Subjective  CC:  Chief Complaint  Patient presents with  . Otalgia    Both side, right side worse.. She reports popping when coughing. Symptoms started about 1 week ago.. She has been using peroxide and laid on heating pad, left got better.     I connected with Joesphine Bare on 08/22/18 at  1:00 PM EDT by a video enabled telemedicine application and verified that I am speaking with the correct person using two identifiers. Location patient: Home Location provider: [employer], Office Persons participating in the virtual visit: Teresa Wells, Leamon Arnt, MD Lilli Light, [place]  I discussed the limitations of evaluation and management by telemedicine and the availability of in person appointments. The patient expressed understanding and agreed to proceed. HPI: Teresa Wells is a 58 y.o. female who was contacted today to address the problems listed above in the chief complaint. . As above, c/o ear pain. Initially with pressure and mild discomfort bilaterally; left ear resolved but right ear has worsened. Now describes red swollen and tender canal. No drainage. + decreased hearing on that side. Denies internal ear pain or fevers, chills, cough or sob. Otherwise feels well. Using tylenol for pain but not effective, especially at night. Avoiding nsaids due to possible concerns (unproven) with covid-19 infections. Has been using ear buds and q-tips.   Assessment  1. Acute swimmer's ear of right side      Plan   Otitis media, right:  Education given. ciprodex bid x 7 days and pain meds as needed. Call back or ov if not improving.   I discussed the assessment and treatment plan with the patient. The patient was provided an opportunity to ask questions and all were answered. The patient agreed with the plan and demonstrated an understanding of the instructions.   The patient was advised to call back or seek an in-person evaluation  if the symptoms worsen or if the condition fails to improve as anticipated. Follow up: Nov for CPE.  Visit date not found  Meds ordered this encounter  Medications  . traMADol (ULTRAM) 50 MG tablet    Sig: Take 1 tablet (50 mg total) by mouth every 8 (eight) hours as needed for up to 5 days.    Dispense:  15 tablet    Refill:  0  . ciprofloxacin-dexamethasone (CIPRODEX) OTIC suspension    Sig: Place 4 drops into the right ear 2 (two) times daily.    Dispense:  7.5 mL    Refill:  0      I reviewed the patients updated PMH, FH, and SocHx.    Patient Active Problem List   Diagnosis Date Noted  . Annual physical exam 02/25/2018  . Obesity with body mass index 30 or greater 07/10/2017  . 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 Insomnia 10/03/2015  . Seasonal allergies 05/29/2012   Current Meds  Medication Sig  . Ascorbic Acid (VITAMIN C) 100 MG tablet Take 100 mg by mouth daily.  . cetirizine (ZYRTEC) 10 MG tablet Take 10 mg by mouth daily. Reported on 10/03/2015  . Cholecalciferol (VITAMIN D3 PO) Take by mouth.  . fluticasone (FLONASE) 50 MCG/ACT nasal spray Place 2 sprays into both nostrils daily.    Allergies: Patient is allergic to sulfasalazine; oxycodone; penicillins; and sulfa antibiotics. Family History: Patient family history includes Anxiety disorder in her mother; Depression in her mother; Diabetes in her father and mother; Heart disease  in her brother, father, and mother; Hyperlipidemia in her father and mother; Hypertension in her brother and father; Obesity in her mother; Sleep apnea in her mother; Stroke in her father. Social History:  Patient  reports that she has never smoked. She has never used smokeless tobacco. She reports that she does not drink alcohol or use drugs.  Review of Systems: Constitutional: Negative for fever malaise or anorexia Cardiovascular: negative for chest pain Respiratory: negative for SOB or persistent cough Gastrointestinal: negative  for abdominal pain  OBJECTIVE Vitals: There were no vitals taken for this visit. General: no acute distress , A&Ox3, appears well Right external ear: reports ttp at [place] with swelling  Leamon Arnt, MD

## 2018-08-22 NOTE — Patient Instructions (Signed)
Please return in Nov 2020 for complete physical.  If you have any questions or concerns, please don't hesitate to send me a message via MyChart or call the office at [phone]. Thank you for visiting with [place] today! It's our pleasure caring for you.   Otitis Media, Adult  Otitis media occurs when there is inflammation and fluid in the middle ear. Your middle ear is a part of the ear that contains bones for hearing as well as air that helps send sounds to your brain. What are the causes? This condition is caused by a blockage in the eustachian tube. This tube drains fluid from the ear to the back of the nose (nasopharynx). A blockage in this tube can be caused by an object or by swelling (edema) in the tube. Problems that can cause a blockage include:  A cold or other upper respiratory infection.  Allergies.  An irritant, such as tobacco smoke.  Enlarged adenoids. The adenoids are areas of soft tissue located high in the back of the throat, behind the nose and the roof of the mouth.  A mass in the nasopharynx.  Damage to the ear caused by pressure changes (barotrauma). What are the signs or symptoms? Symptoms of this condition include:  Ear pain.  A fever.  Decreased hearing.  A headache.  Tiredness (lethargy).  Fluid leaking from the ear.  Ringing in the ear. How is this diagnosed? This condition is diagnosed with a physical exam. During the exam your health care provider will use an instrument called an otoscope to look into your ear and check for redness, swelling, and fluid. He or she will also ask about your symptoms. Your health care provider may also order tests, such as:  A test to check the movement of the eardrum (pneumatic otoscopy). This test is done by squeezing a small amount of air into the ear.  A test that changes air pressure in the middle ear to check how well the eardrum moves and whether the eustachian tube is working (tympanogram). How is this  treated? This condition usually goes away on its own within 3-5 days. But if the condition is caused by a bacteria infection and does not go away own its own, or keeps coming back, your health care provider may:  Prescribe antibiotic medicines to treat the infection.  Prescribe or recommend medicines to control pain. Follow these instructions at home:  Take over-the-counter and prescription medicines only as told by your health care provider.  If you were prescribed an antibiotic medicine, take it as told by your health care provider. Do not stop taking the antibiotic even if you start to feel better.  Keep all follow-up visits as told by your health care provider. This is important. Contact a health care provider if:  You have bleeding from your nose.  There is a lump on your neck.  You are not getting better in 5 days.  You feel worse instead of better. Get help right away if:  You have severe pain that is not controlled with medicine.  You have swelling, redness, or pain around your ear.  You have stiffness in your neck.  A part of your face is paralyzed.  The bone behind your ear (mastoid) is tender when you touch it.  You develop a severe headache. Summary  Otitis media is redness, soreness, and swelling of the middle ear.  This condition usually goes away on its own within 3-5 days.  If the problem does not  go away in 3-5 days, your health care provider may prescribe or recommend medicines to treat your symptoms.  If you were prescribed an antibiotic medicine, take it as told by your health care provider. This information is not intended to replace advice given to you by your health care provider. Make sure you discuss any questions you have with your health care provider. Document Released: 01/20/2004 Document Revised: 04/06/2016 Document Reviewed: 04/06/2016 Elsevier Interactive Patient Education  2019 [employer].

## 2018-10-21 ENCOUNTER — Encounter: Payer: Self-pay | Admitting: Family Medicine

## 2018-10-21 LAB — HM MAMMOGRAPHY

## 2018-12-20 IMAGING — DX DG CHEST 2V
2 series · 2 of 2 positions shown · non-contrast
Comparison: 08/14/2017

CLINICAL DATA: Productive cough for 1 week, history of pneumonia

EXAM:
CHEST - 2 VIEW

[chest pa]
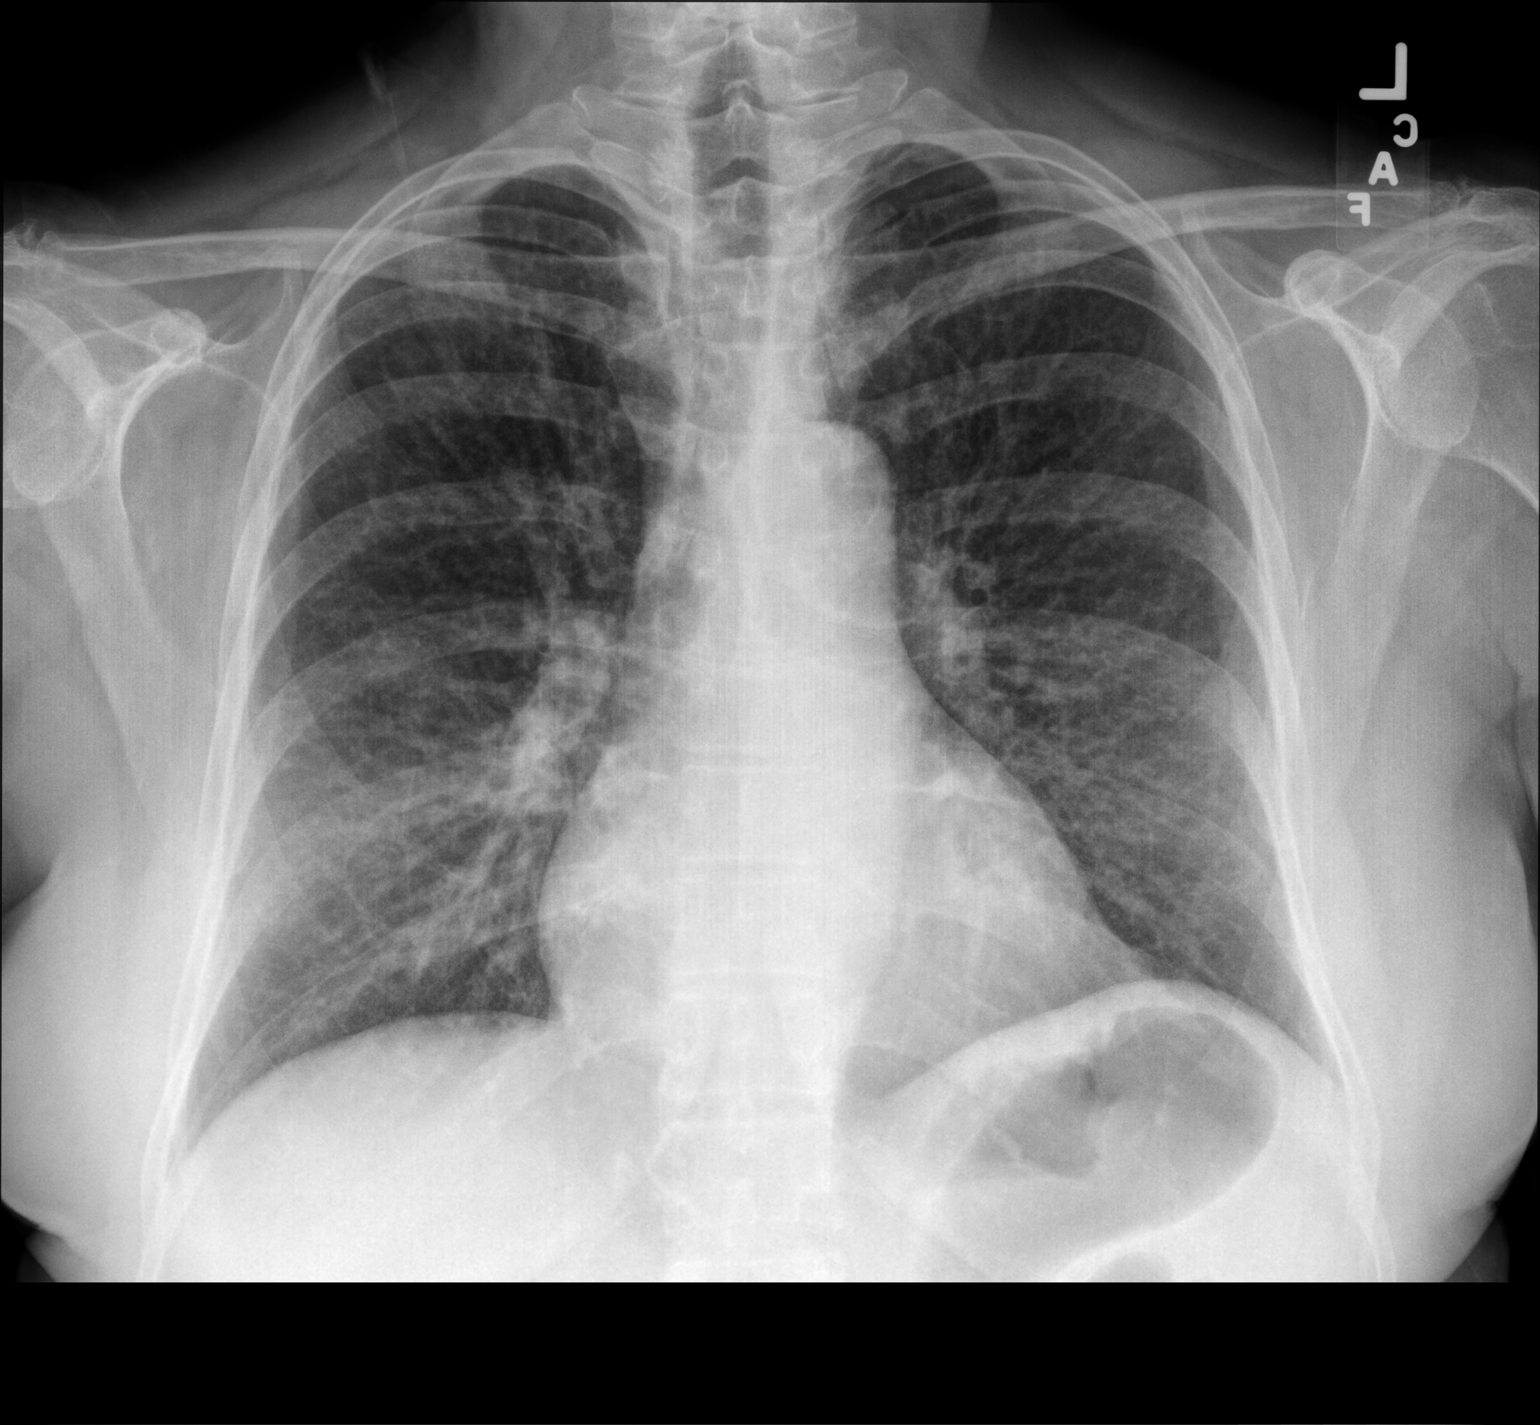

[chest lat]
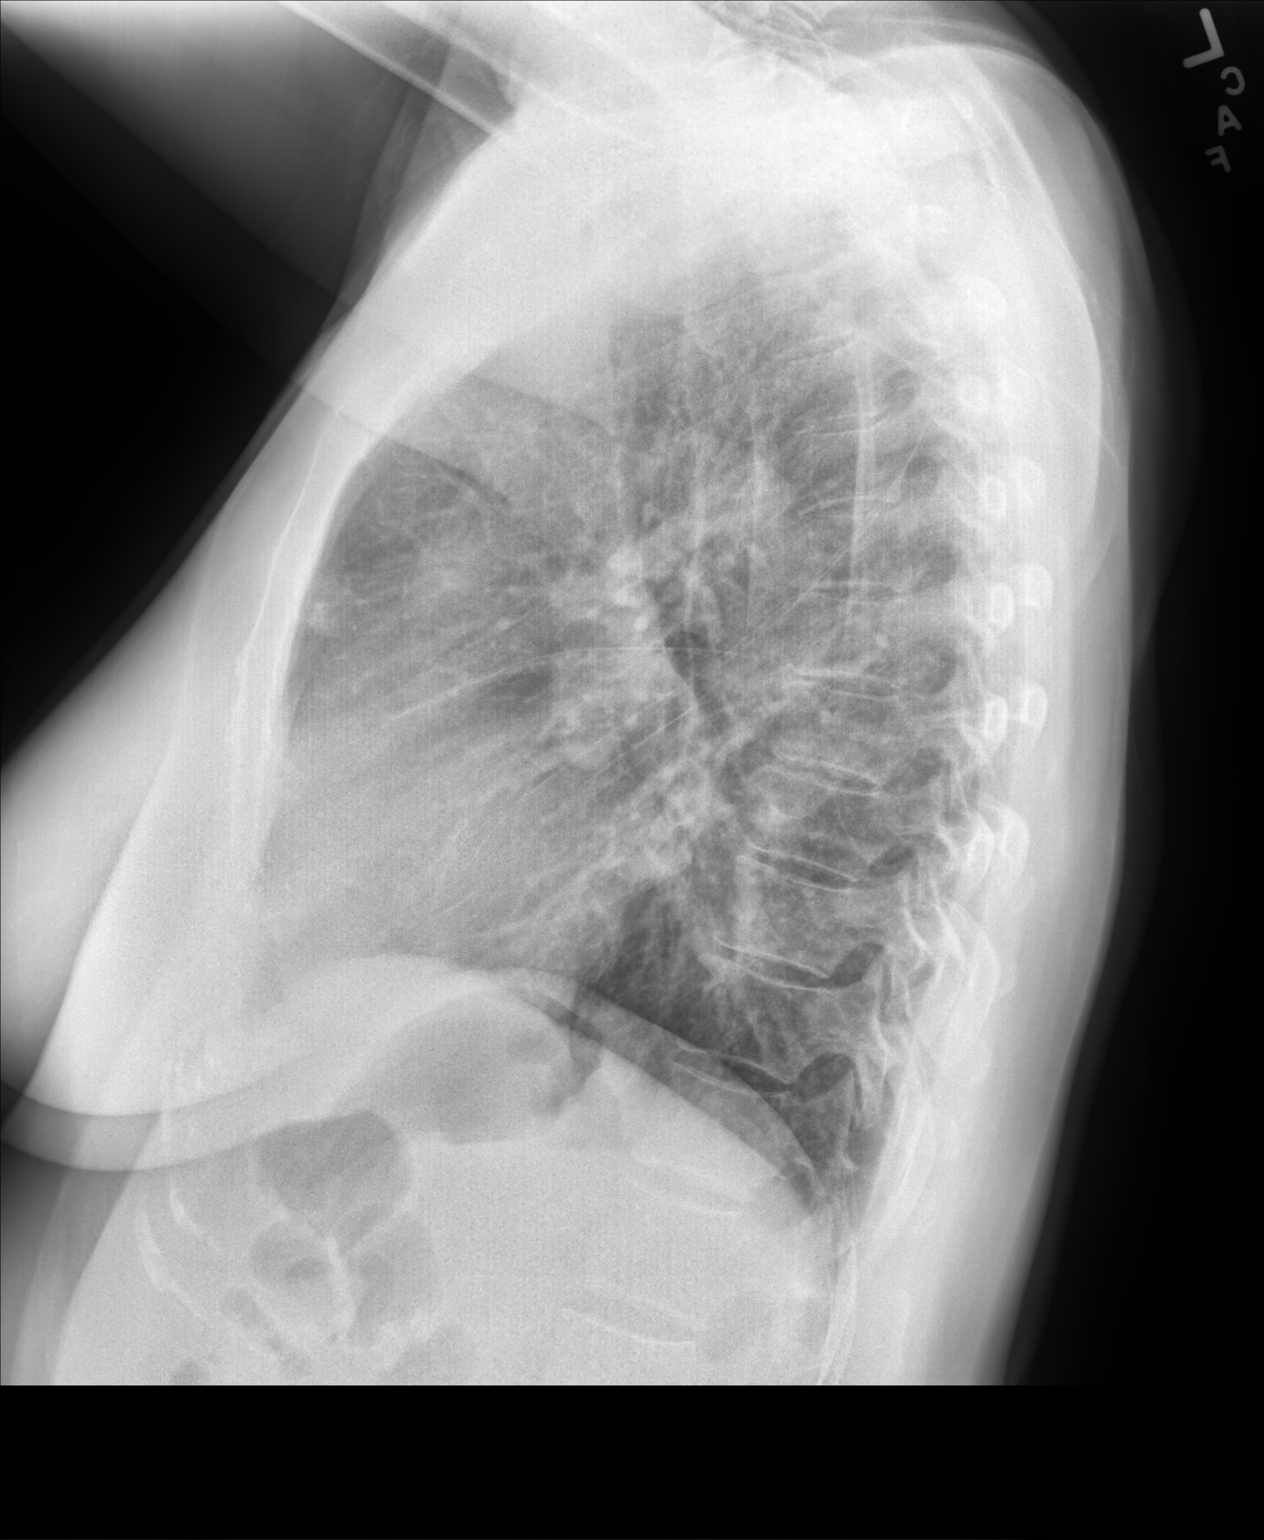

[2 of 2 positions shown; findings below may reference images not displayed]

FINDINGS: Upper normal heart size.

Atherosclerotic calcification aorta.

Mediastinal contours and pulmonary vascularity normal.

Lungs clear.

No pulmonary infiltrate, pleural effusion or pneumothorax.

Scattered grid artifacts.

Osseous structures unremarkable.
IMPRESSION: No acute abnormalities.

## 2019-01-27 ENCOUNTER — Other Ambulatory Visit: Payer: Self-pay

## 2019-01-27 ENCOUNTER — Ambulatory Visit (INDEPENDENT_AMBULATORY_CARE_PROVIDER_SITE_OTHER): Payer: 59

## 2019-01-27 DIAGNOSIS — Z23 Encounter for immunization: Secondary | ICD-10-CM

## 2019-01-27 NOTE — Progress Notes (Signed)
Per orders of Dr. Jonni Sanger, injection of Prevnar 13, 0.5 ml given right deltoid IM by Kevan Ny, CMA  Patient tolerated injection well.

## 2019-01-27 NOTE — Addendum Note (Signed)
Addended by: Kevan Ny on: 01/27/2019 04:15 PM   Modules accepted: Orders

## 2019-03-18 ENCOUNTER — Other Ambulatory Visit: Payer: Self-pay

## 2019-03-18 ENCOUNTER — Ambulatory Visit (INDEPENDENT_AMBULATORY_CARE_PROVIDER_SITE_OTHER): Payer: 59 | Admitting: Family Medicine

## 2019-03-18 ENCOUNTER — Encounter: Payer: Self-pay | Admitting: Family Medicine

## 2019-03-18 VITALS — BP 122/80 | HR 76 | Temp 97.7°F | Ht 62.0 in | Wt 193.0 lb

## 2019-03-18 DIAGNOSIS — E669 Obesity, unspecified: Secondary | ICD-10-CM | POA: Diagnosis not present

## 2019-03-18 DIAGNOSIS — F5101 Primary insomnia: Secondary | ICD-10-CM | POA: Diagnosis not present

## 2019-03-18 DIAGNOSIS — R748 Abnormal levels of other serum enzymes: Secondary | ICD-10-CM | POA: Insufficient documentation

## 2019-03-18 DIAGNOSIS — J302 Other seasonal allergic rhinitis: Secondary | ICD-10-CM

## 2019-03-18 DIAGNOSIS — Z0000 Encounter for general adult medical examination without abnormal findings: Secondary | ICD-10-CM

## 2019-03-18 LAB — COMPREHENSIVE METABOLIC PANEL
ALT: 28 U/L (ref 0–35)
AST: 21 U/L (ref 0–37)
Albumin: 4.3 g/dL (ref 3.5–5.2)
Alkaline Phosphatase: 129 U/L — ABNORMAL HIGH (ref 39–117)
BUN: 10 mg/dL (ref 6–23)
CO2: 29 mEq/L (ref 19–32)
Calcium: 9 mg/dL (ref 8.4–10.5)
Chloride: 101 mEq/L (ref 96–112)
Creatinine, Ser: 0.75 mg/dL (ref 0.40–1.20)
GFR: 78.73 mL/min (ref >60.00)
Glucose, Bld: 94 mg/dL (ref 70–99)
Potassium: 4.8 mEq/L (ref 3.5–5.1)
Sodium: 139 mEq/L (ref 135–145)
Total Bilirubin: 0.6 mg/dL (ref 0.2–1.2)
Total Protein: 6.6 g/dL (ref 6.0–8.3)

## 2019-03-18 LAB — LIPID PANEL
Cholesterol: 271 mg/dL — ABNORMAL HIGH (ref 0–200)
HDL: 72.1 mg/dL (ref >39.00)
LDL Cholesterol: 173 mg/dL — ABNORMAL HIGH (ref 0–99)
NonHDL: 198.99
Total CHOL/HDL Ratio: 4
Triglycerides: 132 mg/dL (ref 0.0–149.0)
VLDL: 26.4 mg/dL (ref 0.0–40.0)

## 2019-03-18 LAB — CBC WITH DIFFERENTIAL/PLATELET
Basophils Absolute: 0.1 10*3/uL (ref 0.0–0.1)
Basophils Relative: 1 % (ref 0.0–3.0)
Eosinophils Absolute: 0.2 10*3/uL (ref 0.0–0.7)
Eosinophils Relative: 2.8 % (ref 0.0–5.0)
HCT: 43.3 % (ref 36.0–46.0)
Hemoglobin: 14.4 g/dL (ref 12.0–15.0)
Lymphocytes Relative: 34.1 % (ref 12.0–46.0)
Lymphs Abs: 2.5 10*3/uL (ref 0.7–4.0)
MCHC: 33.2 g/dL (ref 30.0–36.0)
MCV: 91.8 fl (ref 78.0–100.0)
Monocytes Absolute: 0.7 10*3/uL (ref 0.1–1.0)
Monocytes Relative: 9.2 % (ref 3.0–12.0)
Neutro Abs: 3.9 10*3/uL (ref 1.4–7.7)
Neutrophils Relative %: 52.9 % (ref 43.0–77.0)
Platelets: 293 10*3/uL (ref 150.0–400.0)
RBC: 4.72 Mil/uL (ref 3.87–5.11)
RDW: 13.4 % (ref 11.5–15.5)
WBC: 7.5 10*3/uL (ref 4.0–10.5)

## 2019-03-18 LAB — TSH: TSH: 3.01 u[IU]/mL (ref 0.35–4.50)

## 2019-03-18 MED ORDER — PHENTERMINE HCL 37.5 MG PO TABS: 18.2500 mg | ORAL_TABLET | Freq: Every day | ORAL | 2 refills | Status: DC

## 2019-03-18 MED ORDER — SHINGRIX 50 MCG/0.5ML IM SUSR: 0.5000 mL | Freq: Once | INTRAMUSCULAR | 0 refills | Status: AC

## 2019-03-18 MED ORDER — ZOLPIDEM TARTRATE 5 MG PO TABS: 5.0000 mg | ORAL_TABLET | Freq: Every evening | ORAL | 5 refills | Status: DC | PRN

## 2019-03-18 NOTE — Patient Instructions (Addendum)
Please return in 12 months for your annual complete physical; please come fasting.  I will release your lab results to you on your MyChart account with further instructions. Please reply with any questions.   Please take the prescription for Shingrix to the pharmacy so they may administer the vaccinations. Your insurance will then cover the injections.    If you have any questions or concerns, please don't hesitate to send me a message via MyChart or call the office at [phone]. Thank you for visiting with [place] today! It's our pleasure caring for you.   Preventive Care 40-37 Years Old, Female Preventive care refers to visits with your health care provider and lifestyle choices that can promote health and wellness. This includes:  A yearly physical exam. This may also be called an annual well check.  Regular dental visits and eye exams.  Immunizations.  Screening for certain conditions.  Healthy lifestyle choices, such as eating a healthy diet, getting regular exercise, not using drugs or products that contain nicotine and tobacco, and limiting alcohol use. What can I expect for my preventive care visit? Physical exam Your health care provider will check your:  Height and weight. This may be used to calculate body mass index (BMI), which tells if you are at a healthy weight.  Heart rate and blood pressure.  Skin for abnormal spots. Counseling Your health care provider may ask you questions about your:  Alcohol, tobacco, and drug use.  Emotional well-being.  Home and relationship well-being.  Sexual activity.  Eating habits.  Work and work Statistician.  Method of birth control.  Menstrual cycle.  Pregnancy history. What immunizations do I need?  Influenza (flu) vaccine  This is recommended every year. Tetanus, diphtheria, and pertussis (Tdap) vaccine  You may need a Td booster every 10 years. Varicella (chickenpox) vaccine  You may need this if you have  not been vaccinated. Zoster (shingles) vaccine  You may need this after age 21. Measles, mumps, and rubella (MMR) vaccine  You may need at least one dose of MMR if you were born in 1957 or later. You may also need a second dose. Pneumococcal conjugate (PCV13) vaccine  You may need this if you have certain conditions and were not previously vaccinated. Pneumococcal polysaccharide (PPSV23) vaccine  You may need one or two doses if you smoke cigarettes or if you have certain conditions. Meningococcal conjugate (MenACWY) vaccine  You may need this if you have certain conditions. Hepatitis A vaccine  You may need this if you have certain conditions or if you travel or work in places where you may be exposed to hepatitis A. Hepatitis B vaccine  You may need this if you have certain conditions or if you travel or work in places where you may be exposed to hepatitis B. Haemophilus influenzae type b (Hib) vaccine  You may need this if you have certain conditions. Human papillomavirus (HPV) vaccine  If recommended by your health care provider, you may need three doses over 6 months. You may receive vaccines as individual doses or as more than one vaccine together in one shot (combination vaccines). Talk with your health care provider about the risks and benefits of combination vaccines. What tests do I need? Blood tests  Lipid and cholesterol levels. These may be checked every 5 years, or more frequently if you are over 46 years old.  Hepatitis C test.  Hepatitis B test. Screening  Lung cancer screening. You may have this screening every year starting  at age 15 if you have a 30-pack-year history of smoking and currently smoke or have quit within the past 15 years.  Colorectal cancer screening. All adults should have this screening starting at age 54 and continuing until age 56. Your health care provider may recommend screening at age 47 if you are at increased risk. You will have tests  every 1-10 years, depending on your results and the type of screening test.  Diabetes screening. This is done by checking your blood sugar (glucose) after you have not eaten for a while (fasting). You may have this done every 1-3 years.  Mammogram. This may be done every 1-2 years. Talk with your health care provider about when you should start having regular mammograms. This may depend on whether you have a family history of breast cancer.  BRCA-related cancer screening. This may be done if you have a family history of breast, ovarian, tubal, or peritoneal cancers.  Pelvic exam and Pap test. This may be done every 3 years starting at age 37. Starting at age 23, this may be done every 5 years if you have a Pap test in combination with an HPV test. Other tests  Sexually transmitted disease (STD) testing.  Bone density scan. This is done to screen for osteoporosis. You may have this scan if you are at high risk for osteoporosis. Follow these instructions at home: Eating and drinking  Eat a diet that includes fresh fruits and vegetables, whole grains, lean protein, and low-fat dairy.  Take vitamin and mineral supplements as recommended by your health care provider.  Do not drink alcohol if: ? Your health care provider tells you not to drink. ? You are pregnant, may be pregnant, or are planning to become pregnant.  If you drink alcohol: ? Limit how much you have to 0-1 drink a day. ? Be aware of how much alcohol is in your drink. In the [place] one drink equals one 12 oz bottle of beer (355 mL), one 5 oz glass of wine (148 mL), or one 1 oz glass of hard liquor (44 mL). Lifestyle  Take daily care of your teeth and gums.  Stay active. Exercise for at least 30 minutes on 5 or more days each week.  Do not use any products that contain nicotine or tobacco, such as cigarettes, e-cigarettes, and chewing tobacco. If you need help quitting, ask your health care provider.  If you are sexually  active, practice safe sex. Use a condom or other form of birth control (contraception) in order to prevent pregnancy and STIs (sexually transmitted infections).  If told by your health care provider, take low-dose aspirin daily starting at age 16. What's next?  Visit your health care provider once a year for a well check visit.  Ask your health care provider how often you should have your eyes and teeth checked.  Stay up to date on all vaccines. This information is not intended to replace advice given to you by your health care provider. Make sure you discuss any questions you have with your health care provider. Document Released: 05/13/2015 Document Revised: 12/26/2017 Document Reviewed: 12/26/2017 Elsevier Patient Education  2020 [place] and Cholesterol Restricted Eating Plan Getting too much fat and cholesterol in your diet may cause health problems. Choosing the right foods helps keep your fat and cholesterol at normal levels. This can keep you from getting certain diseases. Your doctor may recommend an eating plan that includes:  Total fat: ______% or  less of total calories a day.  Saturated fat: ______% or less of total calories a day.  Cholesterol: less than _________mg a day.  Fiber: ______g a day. What are tips for following this plan? Meal planning  At meals, divide your plate into four equal parts: ? Fill one-half of your plate with vegetables and green salads. ? Fill one-fourth of your plate with whole grains. ? Fill one-fourth of your plate with low-fat (lean) protein foods.  Eat fish that is high in omega-3 fats at least two times a week. This includes mackerel, tuna, sardines, and salmon.  Eat foods that are high in fiber, such as whole grains, beans, apples, broccoli, carrots, peas, and barley. General tips   Work with your doctor to lose weight if you need to.  Avoid: ? Foods with added sugar. ? Fried foods. ? Foods with partially  hydrogenated oils.  Limit alcohol intake to no more than 1 drink a day for nonpregnant women and 2 drinks a day for men. One drink equals 12 oz of beer, 5 oz of wine, or 1 oz of hard liquor. Reading food labels  Check food labels for: ? Trans fats. ? Partially hydrogenated oils. ? Saturated fat (g) in each serving. ? Cholesterol (mg) in each serving. ? Fiber (g) in each serving.  Choose foods with healthy fats, such as: ? Monounsaturated fats. ? Polyunsaturated fats. ? Omega-3 fats.  Choose grain products that have whole grains. Look for the word "whole" as the first word in the ingredient list. Cooking  Cook foods using low-fat methods. These include baking, boiling, grilling, and broiling.  Eat more home-cooked foods. Eat at restaurants and buffets less often.  Avoid cooking using saturated fats, such as butter, cream, palm oil, palm kernel oil, and coconut oil. Recommended foods  Fruits  All fresh, canned (in natural juice), or frozen fruits. Vegetables  Fresh or frozen vegetables (raw, steamed, roasted, or grilled). Green salads. Grains  Whole grains, such as whole wheat or whole grain breads, crackers, cereals, and pasta. Unsweetened oatmeal, bulgur, barley, quinoa, or brown rice. Corn or whole wheat flour tortillas. Meats and other protein foods  Ground beef (85% or leaner), grass-fed beef, or beef trimmed of fat. Skinless chicken or [place]. Ground chicken or [place]. Pork trimmed of fat. All fish and seafood. Egg whites. Dried beans, peas, or lentils. Unsalted nuts or seeds. Unsalted canned beans. Nut butters without added sugar or oil. Dairy  Low-fat or nonfat dairy products, such as skim or 1% milk, 2% or reduced-fat cheeses, low-fat and fat-free ricotta or cottage cheese, or plain low-fat and nonfat yogurt. Fats and oils  Tub margarine without trans fats. Light or reduced-fat mayonnaise and salad dressings. Avocado. Olive, canola, sesame, or safflower oils. The  items listed above may not be a complete list of foods and beverages you can eat. Contact a dietitian for more information. Foods to avoid Fruits  Canned fruit in heavy syrup. Fruit in cream or butter sauce. Fried fruit. Vegetables  Vegetables cooked in cheese, cream, or butter sauce. Fried vegetables. Grains  White bread. White pasta. White rice. Cornbread. Bagels, pastries, and croissants. Crackers and snack foods that contain trans fat and hydrogenated oils. Meats and other protein foods  Fatty cuts of meat. Ribs, chicken wings, bacon, sausage, bologna, salami, chitterlings, fatback, hot dogs, bratwurst, and packaged lunch meats. Liver and organ meats. Whole eggs and egg yolks. Chicken and [place] with skin. Fried meat. Dairy  Whole or 2% milk, cream, half-and-half,  and cream cheese. Whole milk cheeses. Whole-fat or sweetened yogurt. Full-fat cheeses. Nondairy creamers and whipped toppings. Processed cheese, cheese spreads, and cheese curds. Beverages  Alcohol. Sugar-sweetened drinks such as sodas, lemonade, and fruit drinks. Fats and oils  Butter, stick margarine, lard, shortening, ghee, or bacon fat. Coconut, palm kernel, and palm oils. Sweets and desserts  Corn syrup, sugars, honey, and molasses. Candy. Jam and jelly. Syrup. Sweetened cereals. Cookies, pies, cakes, donuts, muffins, and ice cream. The items listed above may not be a complete list of foods and beverages you should avoid. Contact a dietitian for more information. Summary  Choosing the right foods helps keep your fat and cholesterol at normal levels. This can keep you from getting certain diseases.  At meals, fill one-half of your plate with vegetables and green salads.  Eat high-fiber foods, like whole grains, beans, apples, carrots, peas, and barley.  Limit added sugar, saturated fats, alcohol, and fried foods. This information is not intended to replace advice given to you by your health care provider. Make  sure you discuss any questions you have with your health care provider. Document Released: 10/16/2011 Document Revised: 12/18/2017 Document Reviewed: 01/01/2017 Elsevier Patient Education  2020 [employer].

## 2019-03-18 NOTE — Progress Notes (Signed)
Subjective  Chief Complaint  Patient presents with  . Annual Exam  . Weight Gain  . Medication Refill    Ambien    HPI: Teresa Wells is a 58 y.o. female who presents to [facility] today for a Female Wellness Visit. She also has the concerns and/or needs as listed above in the chief complaint. These will be addressed in addition to the Health Maintenance Visit.   Wellness Visit: annual visit with health maintenance review and exam without Pap   HM: female wellness up to date. nonfasting today. Has been very busy with work. Doing well overall w/o mood problems.  Chronic disease f/u and/or acute problem visit: (deemed necessary to be done in addition to the wellness visit):  Obesity: has regained her weight (was going to healthy weight clinic last year). Eating more and less active. Wants to work on it again  Insomnia: worsening again. Has used ambien prn on and off (last script 09/2017). Failed trazodone and otc meds.   Allergies are stable.   Assessment  1. Annual physical exam   2. Primary insomnia   3. Obesity with body mass index 30 or greater   4. Seasonal allergies      Plan  Female Wellness Visit:  Age appropriate Health Maintenance and Prevention measures were discussed with patient. Included topics are cancer screening recommendations, ways to keep healthy (see AVS) including dietary and exercise recommendations, regular eye and dental care, use of seat belts, and avoidance of moderate alcohol use and tobacco use. Screens are up to date  BMI: discussed patient's BMI and encouraged positive lifestyle modifications to help get to or maintain a target BMI.  HM needs and immunizations were addressed and ordered. See below for orders. See HM and immunization section for updates. Due shingrix  Routine labs and screening tests ordered including cmp, cbc and lipids where appropriate.  Discussed recommendations regarding Vit D and calcium  supplementation (see AVS)  Chronic disease management visit and/or acute problem visit:  Obesity: counseled on diet changes and goal for weight loss; start phentermine to help with polyphagia. Getting better sleep will also help. Educated on expectations/risks and benefits of meds.  Insomnia: restart ambien prn. Sleep hygiene.   Follow up: Return in about 1 year (around 03/17/2020) for complete physical.  Orders Placed This Encounter  Procedures  . CBC w/Diff  . CMP  . Lipids  . TSH   Meds ordered this encounter  Medications  . Zoster Vaccine Adjuvanted [facility]) injection    Sig: Inject 0.5 mLs into the muscle once for 1 dose. Please give 2nd dose 2-6 months after first dose    Dispense:  2 each    Refill:  0  . phentermine (ADIPEX-P) 37.5 MG tablet    Sig: Take 0.5-1 tablets (18.75-37.5 mg total) by mouth daily before breakfast.    Dispense:  30 tablet    Refill:  2  . zolpidem (AMBIEN) 5 MG tablet    Sig: Take 1 tablet (5 mg total) by mouth at bedtime as needed for sleep.    Dispense:  30 tablet    Refill:  5    Not to exceed 3 additional fills before 01/06/2018.      Lifestyle: Body mass index is 35.3 kg/m. Wt Readings from Last 3 Encounters:  03/18/19 193 lb (87.5 kg)  03/21/18 175 lb 3.2 oz (79.5 kg)  02/25/18 174 lb 12.8 oz (79.3 kg)   Diet: general Exercise: never,  Patient Active Problem List   Diagnosis Date Noted  . Annual physical exam 02/25/2018  . Obesity with body mass index 30 or greater 07/10/2017  . 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 Insomnia 10/03/2015    Nortriptyline at hs Zolpidem prn  Potential benefits of a long term benzodiazepines  use as well as potential risks  and complications were explained to the patient and were aknowledged.   . Seasonal allergies 05/29/2012   Health Maintenance  Topic Date Due  . MAMMOGRAM  10/21/2019  . TETANUS/TDAP  11/04/2021  . COLONOSCOPY  03/10/2022  . PAP SMEAR-Modifier  08/29/2022  .  INFLUENZA VACCINE  Completed  . Hepatitis C Screening  Completed  . HIV Screening  Completed   Immunization History  Administered Date(s) Administered  . Influenza Split 01/28/2017  . Influenza,inj,Quad PF,6+ Mos 02/25/2018, 01/27/2019  . Influenza-Unspecified 02/15/2014, 03/18/2015  . Pneumococcal Conjugate-13 01/27/2019  . Pneumococcal Polysaccharide-23 02/25/2018  . Tdap 11/05/2011   We updated and reviewed the patient's past history in detail and it is documented below. Allergies: Patient is allergic to sulfasalazine; oxycodone; penicillins; and sulfa antibiotics. Past Medical History Patient  has a past medical history of Acute meniscal tear of knee (LEFT), Allergy, Anemia, Chronic respiratory failure with hypoxia ([place]) (07/26/2017), Constipation, and Insomnia. Past Surgical History Patient  has a past surgical history that includes Bilateral salpingoophorectomy (2009); Hysteroscopy with resectoscope (2006); Knee arthroscopy (05/04/2011); Meniscus debridement (05/04/2011); Breast fibroadenoma surgery (2001); Incisional hernia repair (N/A, 11/12/2013); Insertion of mesh (N/A, 11/12/2013); Mole removal (Left, 11/12/2013); Open reduction internal fixation (orif) distal radial fracture (Left, 08/27/2014); and Carpal tunnel release (Left, 08/27/2014). Family History: Patient family history includes Anxiety disorder in her mother; Depression in her mother; Diabetes in her father and mother; Heart disease in her brother, father, and mother; Hyperlipidemia in her father and mother; Hypertension in her brother and father; Obesity in her mother; Sleep apnea in her mother; Stroke in her father. Social History:  Patient  reports that she has never smoked. She has never used smokeless tobacco. She reports that she does not drink alcohol or use drugs.  Review of Systems: Constitutional: negative for fever or malaise Ophthalmic: negative for photophobia, double vision or loss of vision Cardiovascular:  negative for chest pain, dyspnea on exertion, or new LE swelling Respiratory: negative for SOB or persistent cough Gastrointestinal: negative for abdominal pain, change in bowel habits or melena Genitourinary: negative for dysuria or gross hematuria, no abnormal uterine bleeding or disharge Musculoskeletal: negative for new gait disturbance or muscular weakness Integumentary: negative for new or persistent rashes, no breast lumps Neurological: negative for TIA or stroke symptoms Psychiatric: negative for SI or delusions Allergic/Immunologic: negative for hives  Patient Care Team    Relationship Specialty Notifications Start End  Leamon Arnt, MD PCP - General Family Medicine  06/26/17   Delsa Bern, MD Consulting Physician Obstetrics and Gynecology  09/03/17   Melvenia Needles, NP Nurse Practitioner Pulmonary Disease  09/03/17     Objective  Vitals: BP 122/80 (BP Location: Left Arm, Patient Position: Sitting, Cuff Size: Normal)   Pulse 76   Temp 97.7 F (36.5 C) (Temporal)   Ht 5\' 2"  (1.575 m)   Wt 193 lb (87.5 kg)   SpO2 97%   BMI 35.30 kg/m  General:  Well developed, well nourished, no acute distress  Psych:  Alert and orientedx3,normal mood and affect HEENT:  Normocephalic, atraumatic, non-icteric sclera, PERRL, oropharynx is clear without mass or exudate, supple neck  without adenopathy, mass or thyromegaly Cardiovascular:  Normal S1, S2, RRR without gallop, rub or murmur, nondisplaced PMI Respiratory:  Good breath sounds bilaterally, CTAB with normal respiratory effort Gastrointestinal: normal bowel sounds, soft, non-tender, no noted masses. No HSM MSK: no deformities, contusions. Joints are without erythema or swelling. Spine and CVA region are nontender Skin:  Warm, no rashes or suspicious lesions noted Neurologic:    Mental status is normal. CN 2-11 are normal. Gross motor and sensory exams are normal. Normal gait. No tremor   Commons side effects, risks, benefits, and  alternatives for medications and treatment plan prescribed today were discussed, and the patient expressed understanding of the given instructions. Patient is instructed to call or message via MyChart if he/she has any questions or concerns regarding our treatment plan. No barriers to understanding were identified. We discussed Red Flag symptoms and signs in detail. Patient expressed understanding regarding what to do in case of urgent or emergency type symptoms.   Medication list was reconciled, printed and provided to the patient in AVS. Patient instructions and summary information was reviewed with the patient as documented in the AVS. This note was prepared with assistance of Dragon voice recognition software. Occasional wrong-word or sound-a-like substitutions may have occurred due to the inherent limitations of voice recognition software

## 2019-04-13 ENCOUNTER — Encounter: Payer: Self-pay | Admitting: Family Medicine

## 2019-04-14 ENCOUNTER — Encounter: Payer: Self-pay | Admitting: Family Medicine

## 2019-04-14 ENCOUNTER — Ambulatory Visit (INDEPENDENT_AMBULATORY_CARE_PROVIDER_SITE_OTHER): Payer: 59 | Admitting: Family Medicine

## 2019-04-14 VITALS — BP 125/80 | Temp 97.7°F | Ht 62.0 in | Wt 188.0 lb

## 2019-04-14 DIAGNOSIS — J0100 Acute maxillary sinusitis, unspecified: Secondary | ICD-10-CM | POA: Diagnosis not present

## 2019-04-14 MED ORDER — AZITHROMYCIN 250 MG PO TABS: ORAL_TABLET | ORAL | 0 refills | Status: DC

## 2019-04-14 NOTE — Progress Notes (Signed)
Virtual Visit via Video Note  Subjective  CC: No chief complaint on file. cc: cold. Requests zpak. See phone note from yesterday   I connected with Teresa Wells on 04/14/19 at  3:20 PM EST by a video enabled telemedicine application and verified that I am speaking with the correct person using two identifiers. Location patient: Home Location provider: [facility] [place], Office Persons participating in the virtual visit: Teresa Wells, Leamon Arnt, MD Serita Sheller, [place] discussed the limitations of evaluation and management by telemedicine and the availability of in person appointments. The patient expressed understanding and agreed to proceed. HPI: Teresa Wells is a 58 y.o. female who was contacted today to address the problems listed above in the chief complaint. . 58 yo w/ h/o respiratory failure from PNA last year c/o 1 week h/o URI sxs that have progressed to productive cough and sinus "puffiness" and pressure. No fevers or SOB. No loss of taste or smell. Has not been tested for covid and does not want to be but feels a zpak will resolve her sxs. She is on flonase, zyrtec and decongestants already. Has h/o AR.   Assessment  1. Acute non-recurrent maxillary sinusitis      Plan   URI vs sinusisits:  Discussed possible viral URI vs Covid, atypical vs sinusitis. Pt refuses covid testing at this time. Discussed wearing a mask and self isolating. zpak for possible bacterial infection. F/u if not improving.  I discussed the assessment and treatment plan with the patient. The patient was provided an opportunity to ask questions and all were answered. The patient agreed with the plan and demonstrated an understanding of the instructions.   The patient was advised to call back or seek an in-person evaluation if the symptoms worsen or if the condition fails to improve as anticipated. Follow up: No follow-ups on file.  Visit date not found  Meds  ordered this encounter  Medications  . azithromycin (ZITHROMAX) 250 MG tablet    Sig: Take 2 tabs today, then 1 tab daily for 4 days    Dispense:  1 each    Refill:  0      I reviewed the patients updated PMH, FH, and SocHx.    Patient Active Problem List   Diagnosis Date Noted  . Elevated alkaline phosphatase level 03/18/2019  . Annual physical exam 02/25/2018  . Obesity with body mass index 30 or greater 07/10/2017  . 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 Insomnia 10/03/2015  . Seasonal allergies 05/29/2012   Current Meds  Medication Sig  . cetirizine (ZYRTEC) 10 MG tablet Take 10 mg by mouth daily. Reported on 10/03/2015  . Cholecalciferol (VITAMIN D3 PO) Take by mouth.  . phentermine (ADIPEX-P) 37.5 MG tablet Take 0.5-1 tablets (18.75-37.5 mg total) by mouth daily before breakfast.  . zinc gluconate 50 MG tablet Take 50 mg by mouth daily.  Marland Kitchen zolpidem (AMBIEN) 5 MG tablet Take 1 tablet (5 mg total) by mouth at bedtime as needed for sleep.    Allergies: Patient is allergic to sulfasalazine; oxycodone; penicillins; and sulfa antibiotics. Family History: Patient family history includes Anxiety disorder in her mother; Depression in her mother; Diabetes in her father and mother; Heart disease in her brother, father, and mother; Hyperlipidemia in her father and mother; Hypertension in her brother and father; Obesity in her mother; Sleep apnea in her mother; Stroke in her father. Social History:  Patient  reports that she has never smoked. She has never used smokeless tobacco. She reports that she does not drink alcohol or use drugs.  Review of Systems: Constitutional: Negative for fever malaise or anorexia Cardiovascular: negative for chest pain Respiratory: negative for SOB or persistent cough Gastrointestinal: negative for abdominal pain  OBJECTIVE Vitals: BP 125/80   Temp 97.7 F (36.5 C) (Temporal)   Ht 5\' 2"  (1.575 m)   Wt 188 lb (85.3 kg)   BMI 34.39 kg/m    General: no acute distress , A&Ox3, mild facial swelling. No respiratory distress  Leamon Arnt, MD

## 2019-07-08 ENCOUNTER — Encounter: Payer: Self-pay | Admitting: Family Medicine

## 2019-07-08 ENCOUNTER — Ambulatory Visit: Payer: 59 | Admitting: Family Medicine

## 2019-07-08 ENCOUNTER — Other Ambulatory Visit: Payer: Self-pay

## 2019-07-08 VITALS — BP 128/82 | HR 71 | Temp 98.0°F | Ht 62.0 in | Wt 189.4 lb

## 2019-07-08 DIAGNOSIS — M62838 Other muscle spasm: Secondary | ICD-10-CM

## 2019-07-08 DIAGNOSIS — F430 Acute stress reaction: Secondary | ICD-10-CM | POA: Diagnosis not present

## 2019-07-08 NOTE — Patient Instructions (Signed)
Please follow up if symptoms do not improve or as needed.   Please call [facility] to schedule an appointment with Dr. Trey Paula; she is a therapist here at our [place] office.  The phone number is: [phone]

## 2019-07-08 NOTE — Progress Notes (Signed)
Subjective  CC:  Chief Complaint  Patient presents with  . Arm Pain    right forearm pain and bruising on arm. Neck is tight    HPI: Teresa Wells is a 59 y.o. female who presents to the office today to address the problems listed above in the chief complaint.  "I'm stressed": pt fell forward and landed on bilatereal forearms back in December on wooden floors. Did fine. But now with occ sharp pain distal to elbow if she leans on it a certain way: sporadic and not reproducible. Also noting occ small bruises on hands or forearms. Has upper neck pain and tightness. Worries: "need to be healthy"  Further interview: husband has been controlling and requiring all of her time. She works full time all day and then has not had any down time. This stresses her. Hasn't been able to go to gym anymore since husband made her stop. "mind is always going".  Feeling overwhelmed with responsibilities. No mood d/o, panic sxs or depression. Is taking vacation next week to get a massage and slow down. Sleeps well mostly, occ ambien use. Gets 8-9 hours per night.  Reviewed labs from cpe in November: nl.    Assessment  1. Stress reaction   2. Trapezius muscle spasm      Plan   stress:  Counseling done. To work on setting boundaries, getting some time for herself, starting exercise again, consider counseling, talking with husband etc ... pt will f/u with me if worsening. Reassured, no sign of organic pathology now.   msc spasm/stress induced, relaxation techniques, massage. Exercise. Defer mm relaxer now.   Follow up: prn  Visit date not found  No orders of the defined types were placed in this encounter.  No orders of the defined types were placed in this encounter.     I reviewed the patients updated PMH, FH, and SocHx.    Patient Active Problem List   Diagnosis Date Noted  . Elevated alkaline phosphatase level 03/18/2019  . Obesity with body mass index 30 or greater 07/10/2017  .  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 Insomnia 10/03/2015  . Seasonal allergies 05/29/2012   Current Meds  Medication Sig  . acetaminophen (TYLENOL) 325 MG tablet Take 650 mg by mouth every 6 (six) hours as needed.  Marland Kitchen ascorbic acid (VITAMIN C) 1000 MG tablet Take 1,000 mg by mouth daily.  . cetirizine (ZYRTEC) 10 MG tablet Take 10 mg by mouth daily. Reported on 10/03/2015  . Cholecalciferol (VITAMIN D3 PO) Take by mouth.  . phentermine (ADIPEX-P) 37.5 MG tablet Take 0.5-1 tablets (18.75-37.5 mg total) by mouth daily before breakfast.  . zinc gluconate 50 MG tablet Take 50 mg by mouth daily.  Marland Kitchen zolpidem (AMBIEN) 5 MG tablet Take 1 tablet (5 mg total) by mouth at bedtime as needed for sleep.    Allergies: Patient is allergic to sulfasalazine; oxycodone; penicillins; and sulfa antibiotics. Family History: Patient family history includes Anxiety disorder in her mother; Depression in her mother; Diabetes in her father and mother; Heart disease in her brother, father, and mother; Hyperlipidemia in her father and mother; Hypertension in her brother and father; Obesity in her mother; Sleep apnea in her mother; Stroke in her father. Social History:  Patient  reports that she has never smoked. She has never used smokeless tobacco. She reports that she does not drink alcohol or use drugs.  Review of Systems: Constitutional: Negative for fever malaise or anorexia Cardiovascular: negative for  chest pain Respiratory: negative for SOB or persistent cough Gastrointestinal: negative for abdominal pain  Objective  Vitals: BP 128/82 (BP Location: Right Arm, Patient Position: Sitting, Cuff Size: Large)   Pulse 71   Temp 98 F (36.7 C) (Temporal)   Ht 5\' 2"  (1.575 m)   Wt 189 lb 6.4 oz (85.9 kg)   SpO2 96%   BMI 34.64 kg/m  General: no acute distress , A&Ox3 HEENT: PEERL, conjunctiva normal, bilateral trap ttp and knots w/ mild ttp, FROM neck Skin:  Warm, no rashes, small bruise on left hand  over bony prominence Elbow:left: normal.      Commons side effects, risks, benefits, and alternatives for medications and treatment plan prescribed today were discussed, and the patient expressed understanding of the given instructions. Patient is instructed to call or message via MyChart if he/she has any questions or concerns regarding our treatment plan. No barriers to understanding were identified. We discussed Red Flag symptoms and signs in detail. Patient expressed understanding regarding what to do in case of urgent or emergency type symptoms.   Medication list was reconciled, printed and provided to the patient in AVS. Patient instructions and summary information was reviewed with the patient as documented in the AVS. This note was prepared with assistance of Dragon voice recognition software. Occasional wrong-word or sound-a-like substitutions may have occurred due to the inherent limitations of voice recognition software  This visit occurred during the SARS-CoV-2 public health emergency.  Safety protocols were in place, including screening questions prior to the visit, additional usage of staff PPE, and extensive cleaning of exam room while observing appropriate contact time as indicated for disinfecting solutions.

## 2019-07-16 ENCOUNTER — Ambulatory Visit: Payer: 59 | Admitting: Family Medicine

## 2019-09-11 ENCOUNTER — Other Ambulatory Visit: Payer: Self-pay | Admitting: Family Medicine

## 2019-09-11 NOTE — Telephone Encounter (Signed)
[facility] Database Verified LR: 07-06-2019 Qty: 30  Last office visit: 07-08-2019 Upcoming appointment: No pending appt

## 2019-10-27 LAB — HM MAMMOGRAPHY

## 2019-11-25 ENCOUNTER — Encounter: Payer: Self-pay | Admitting: Family Medicine

## 2020-02-20 ENCOUNTER — Other Ambulatory Visit: Payer: Self-pay | Admitting: Family Medicine

## 2020-02-22 NOTE — Telephone Encounter (Signed)
Last refill: 03/08/19 #30, 5 Last OV: 07/08/19 dx. Arm pain, stress reaction

## 2020-02-22 NOTE — Telephone Encounter (Signed)
Refilled ambien x 30 days.  Last cpe was nov 2020  Overdue for visit/fu and cpe. Please call to get scheduled.

## 2020-03-21 ENCOUNTER — Ambulatory Visit: Payer: 59 | Admitting: Family Medicine

## 2020-06-30 ENCOUNTER — Encounter: Payer: Self-pay | Admitting: Family Medicine

## 2020-06-30 ENCOUNTER — Other Ambulatory Visit: Payer: Self-pay

## 2020-06-30 ENCOUNTER — Ambulatory Visit (INDEPENDENT_AMBULATORY_CARE_PROVIDER_SITE_OTHER): Payer: 59 | Admitting: Family Medicine

## 2020-06-30 VITALS — BP 144/83 | HR 85 | Temp 98.6°F | Ht 62.0 in | Wt 189.4 lb

## 2020-06-30 DIAGNOSIS — F5101 Primary insomnia: Secondary | ICD-10-CM | POA: Diagnosis not present

## 2020-06-30 DIAGNOSIS — J302 Other seasonal allergic rhinitis: Secondary | ICD-10-CM

## 2020-06-30 DIAGNOSIS — Z0000 Encounter for general adult medical examination without abnormal findings: Secondary | ICD-10-CM | POA: Diagnosis not present

## 2020-06-30 DIAGNOSIS — E669 Obesity, unspecified: Secondary | ICD-10-CM | POA: Diagnosis not present

## 2020-06-30 DIAGNOSIS — R030 Elevated blood-pressure reading, without diagnosis of hypertension: Secondary | ICD-10-CM | POA: Diagnosis not present

## 2020-06-30 LAB — COMPREHENSIVE METABOLIC PANEL
ALT: 19 U/L (ref 0–35)
AST: 17 U/L (ref 0–37)
Albumin: 4 g/dL (ref 3.5–5.2)
Alkaline Phosphatase: 172 U/L — ABNORMAL HIGH (ref 39–117)
BUN: 15 mg/dL (ref 6–23)
CO2: 31 mEq/L (ref 19–32)
Calcium: 9.7 mg/dL (ref 8.4–10.5)
Chloride: 99 mEq/L (ref 96–112)
Creatinine, Ser: 0.82 mg/dL (ref 0.40–1.20)
GFR: 77.08 mL/min (ref >60.00)
Glucose, Bld: 100 mg/dL — ABNORMAL HIGH (ref 70–99)
Potassium: 4.1 mEq/L (ref 3.5–5.1)
Sodium: 138 mEq/L (ref 135–145)
Total Bilirubin: 0.4 mg/dL (ref 0.2–1.2)
Total Protein: 7.7 g/dL (ref 6.0–8.3)

## 2020-06-30 LAB — CBC WITH DIFFERENTIAL/PLATELET
Basophils Absolute: 0.1 10*3/uL (ref 0.0–0.1)
Basophils Relative: 0.6 % (ref 0.0–3.0)
Eosinophils Absolute: 0.3 10*3/uL (ref 0.0–0.7)
Eosinophils Relative: 1.9 % (ref 0.0–5.0)
HCT: 40.9 % (ref 36.0–46.0)
Hemoglobin: 13.6 g/dL (ref 12.0–15.0)
Lymphocytes Relative: 21.4 % (ref 12.0–46.0)
Lymphs Abs: 2.8 10*3/uL (ref 0.7–4.0)
MCHC: 33.2 g/dL (ref 30.0–36.0)
MCV: 91.1 fl (ref 78.0–100.0)
Monocytes Absolute: 0.9 10*3/uL (ref 0.1–1.0)
Monocytes Relative: 6.5 % (ref 3.0–12.0)
Neutro Abs: 9.1 10*3/uL — ABNORMAL HIGH (ref 1.4–7.7)
Neutrophils Relative %: 69.6 % (ref 43.0–77.0)
Platelets: 320 10*3/uL (ref 150.0–400.0)
RBC: 4.5 Mil/uL (ref 3.87–5.11)
RDW: 13.4 % (ref 11.5–15.5)
WBC: 13.1 10*3/uL — ABNORMAL HIGH (ref 4.0–10.5)

## 2020-06-30 LAB — LIPID PANEL
Cholesterol: 212 mg/dL — ABNORMAL HIGH (ref 0–200)
HDL: 55 mg/dL (ref >39.00)
LDL Cholesterol: 138 mg/dL — ABNORMAL HIGH (ref 0–99)
NonHDL: 156.67
Total CHOL/HDL Ratio: 4
Triglycerides: 91 mg/dL (ref 0.0–149.0)
VLDL: 18.2 mg/dL (ref 0.0–40.0)

## 2020-06-30 LAB — TSH: TSH: 2.65 u[IU]/mL (ref 0.35–4.50)

## 2020-06-30 MED ORDER — ZOLPIDEM TARTRATE 5 MG PO TABS: 5.0000 mg | ORAL_TABLET | Freq: Every evening | ORAL | 2 refills | Status: DC | PRN

## 2020-06-30 MED ORDER — AZELASTINE HCL 0.1 % NA SOLN: 1.0000 | Freq: Two times a day (BID) | NASAL | 5 refills | Status: DC

## 2020-06-30 MED ORDER — MOMETASONE FUROATE 50 MCG/ACT NA SUSP: 1.0000 | Freq: Every day | NASAL | 11 refills | Status: DC

## 2020-06-30 NOTE — Progress Notes (Signed)
Subjective    Chief Complaint  Patient presents with  . Annual Exam  . Cough    Dry cough  x 2week Productive cough first thing in the morning      HPI: Teresa Wells is a 60 y.o. female who presents to [facility] today for a Female Wellness Visit. She also has the concerns and/or needs as listed above in the chief complaint. These will be addressed in addition to the Health Maintenance Visit.   Wellness Visit: annual visit with health maintenance review and exam without Pap   HM: Overall doing well.  Mammogram and Pap smear up-to-date.  She will schedule with her gynecologist for female wellness exam.  She is fasting today for lab work.  Immunizations are all up-to-date.  She is eligible for Covid booster.  Married, continues to work full-time. Chronic disease f/u and/or acute problem visit: (deemed necessary to be done in addition to the wellness visit):  Primary insomnia: Overall improved.  Still uses Ambien but periodically.  Has been out for several months.  Uses it about once or twice a week only if needed.  No adverse effects.  Seasonal allergies: Chronically on Zyrtec and intermittent Flonase.  Over the last 8 weeks or so, awakens with postnasal drainage and cough.  Once clears drainage, she does well.  She has no other symptoms of infection.  No fevers chills sore throat ear pain.  She has been tested for Covid twice most recently 2 weeks ago and it remains negative.  No known exposures.  Occasional sneezing.  No eye symptoms.  Denies GERD  Obesity: Has been on phentermine.  Reports that she lost about 10 pounds.  Ran out last year.  Over the holidays and winter months she has regained some of the weight.  She is ready to start working on weight loss again.  She did not have any side effects from the medication.  Elevated blood pressure: No history of hypertension.  She reports the cough irritates her and her sleep has been affected and she wonders  if this is why her blood pressure is borderline high today.  She denies chest pain, shortness of breath, lower extremity edema.  She does have a blood pressure cuff at home.  Assessment  1. Annual physical exam   2. Primary insomnia   3. Seasonal allergies   4. Obesity with body mass index 30 or greater   5. Elevated blood pressure reading without diagnosis of hypertension      Plan  Female Wellness Visit:  Age appropriate Health Maintenance and Prevention measures were discussed with patient. Included topics are cancer screening recommendations, ways to keep healthy (see AVS) including dietary and exercise recommendations, regular eye and dental care, use of seat belts, and avoidance of moderate alcohol use and tobacco use.  Screens are up-to-date.  BMI: discussed patient's BMI and encouraged positive lifestyle modifications to help get to or maintain a target BMI.  HM needs and immunizations were addressed and ordered. See below for orders. See HM and immunization section for updates.  Recommend COVID booster  Routine labs and screening tests ordered including cmp, cbc and lipids where appropriate.  Discussed recommendations regarding Vit D and calcium supplementation (see AVS)  Chronic disease management visit and/or acute problem visit:  Primary insomnia: Manages mostly behaviorally.  Refilled Ambien for intermittent use.  Discussed risks and benefits.  Seasonal allergies, now exacerbated with cough: Change to Nasonex daily, continue Zyrtec and add  Astelin.  Recheck in 6 weeks if not improving.  Obesity: Discussed increasing activity.  Will refrain from phentermine use at the moment.  Monitor blood pressures at home.  Discussed goals, low-salt diet, weight loss.  Follow-up in the office if blood pressure remains mildly elevated.  Patient understands and agrees with care plan.   Follow up: Return in about 1 year (around 06/30/2021) for complete physical.  Orders Placed This  Encounter  Procedures  . CBC with Differential/Platelet  . Comprehensive metabolic panel  . Lipid panel  . TSH   Meds ordered this encounter  Medications  . zolpidem (AMBIEN) 5 MG tablet    Sig: Take 1 tablet (5 mg total) by mouth at bedtime as needed. for sleep    Dispense:  30 tablet    Refill:  2    This request is for a new prescription for a controlled substance as required by Federal/State law.  . mometasone (NASONEX) 50 MCG/ACT nasal spray    Sig: Place 1 spray into the nose daily.    Dispense:  1 each    Refill:  11  . azelastine (ASTELIN) 0.1 % nasal spray    Sig: Place 1 spray into both nostrils 2 (two) times daily.    Dispense:  30 mL    Refill:  5      Body mass index is 34.64 kg/m. Wt Readings from Last 3 Encounters:  06/30/20 189 lb 6.4 oz (85.9 kg)  07/08/19 189 lb 6.4 oz (85.9 kg)  04/14/19 188 lb (85.3 kg)     Patient Active Problem List   Diagnosis Date Noted  . Elevated alkaline phosphatase level 03/18/2019    129 03/2019: mild; recheck and work up if worsens. asymptomatic   . Obesity with body mass index 30 or greater 07/10/2017  . 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 Insomnia 10/03/2015    Nortriptyline at hs Zolpidem prn  Potential benefits of a long term benzodiazepines  use as well as potential risks  and complications were explained to the patient and were aknowledged.   . Seasonal allergies 05/29/2012   Health Maintenance  Topic Date Due  . COVID-19 Vaccine (3 - Pfizer risk 4-dose series) 02/15/2020  . MAMMOGRAM  10/26/2020  . TETANUS/TDAP  11/04/2021  . COLONOSCOPY (Pts 45-53yrs Insurance coverage will need to be confirmed)  03/10/2022  . PAP SMEAR-Modifier  08/29/2022  . INFLUENZA VACCINE  Completed  . Hepatitis C Screening  Completed  . HIV Screening  Completed  . HPV VACCINES  Aged Out   Immunization History  Administered Date(s) Administered  . Influenza Split 01/28/2017  . Influenza,inj,Quad PF,6+ Mos 02/25/2018,  01/27/2019, 02/27/2020  . Influenza-Unspecified 02/15/2014, 03/18/2015  . PFIZER(Purple Top)SARS-COV-2 Vaccination 12/28/2019, 01/18/2020  . Pneumococcal Conjugate-13 01/27/2019  . Pneumococcal Polysaccharide-23 02/25/2018  . Tdap 11/05/2011  . Zoster Recombinat (Shingrix) 05/15/2019, 07/26/2019   We updated and reviewed the patient's past history in detail and it is documented below. Allergies: Patient is allergic to sulfasalazine, oxycodone, penicillins, and sulfa antibiotics. Past Medical History Patient  has a past medical history of Acute meniscal tear of knee (LEFT), Allergy, Anemia, Chronic respiratory failure with hypoxia ([place]) (07/26/2017), Constipation, and Insomnia. Past Surgical History Patient  has a past surgical history that includes Bilateral salpingoophorectomy (2009); Hysteroscopy with resectoscope (2006); Knee arthroscopy (05/04/2011); Meniscus debridement (05/04/2011); Breast fibroadenoma surgery (2001); Incisional hernia repair (N/A, 11/12/2013); Insertion of mesh (N/A, 11/12/2013); Mole removal (Left, 11/12/2013); Open reduction internal fixation (orif) distal radial fracture (Left,  08/27/2014); and Carpal tunnel release (Left, 08/27/2014). Family History: Patient family history includes Anxiety disorder in her mother; Depression in her mother; Diabetes in her father and mother; Heart disease in her brother, father, and mother; Hyperlipidemia in her father and mother; Hypertension in her brother and father; Obesity in her mother; Sleep apnea in her mother; Stroke in her father. Social History:  Patient  reports that she has never smoked. She has never used smokeless tobacco. She reports that she does not drink alcohol and does not use drugs.  Review of Systems: Constitutional: negative for fever or malaise Ophthalmic: negative for photophobia, double vision or loss of vision Cardiovascular: negative for chest pain, dyspnea on exertion, or new LE swelling Respiratory: negative for  SOB or persistent cough Gastrointestinal: negative for abdominal pain, change in bowel habits or melena Genitourinary: negative for dysuria or gross hematuria, no abnormal uterine bleeding or disharge Musculoskeletal: negative for new gait disturbance or muscular weakness Integumentary: negative for new or persistent rashes, no breast lumps Neurological: negative for TIA or stroke symptoms Psychiatric: negative for SI or delusions Allergic/Immunologic: negative for hives  Patient Care Team    Relationship Specialty Notifications Start End  Leamon Arnt, MD PCP - General Family Medicine  06/26/17   Delsa Bern, MD Consulting Physician Obstetrics and Gynecology  09/03/17   Melvenia Needles, NP Nurse Practitioner Pulmonary Disease  09/03/17     Objective  Vitals: BP (!) 144/83   Pulse 85   Temp 98.6 F (37 C) (Temporal)   Ht 5\' 2"  (1.575 m)   Wt 189 lb 6.4 oz (85.9 kg)   SpO2 96%   BMI 34.64 kg/m  General:  Well developed, well nourished, no acute distress  Psych:  Alert and orientedx3,normal mood and affect HEENT:  Normocephalic, atraumatic, non-icteric sclera,  supple neck without adenopathy, mass or thyromegaly Cardiovascular:  Normal S1, S2, RRR without gallop, rub or murmur Respiratory:  Good breath sounds bilaterally, CTAB with normal respiratory effort Gastrointestinal: normal bowel sounds, soft, non-tender, no noted masses. No HSM MSK: no deformities, contusions. Joints are without erythema or swelling.  Skin:  Warm, no rashes or suspicious lesions noted Neurologic:    Mental status is normal. CN 2-11 are normal. Gross motor and sensory exams are normal. Normal gait. No tremor    Commons side effects, risks, benefits, and alternatives for medications and treatment plan prescribed today were discussed, and the patient expressed understanding of the given instructions. Patient is instructed to call or message via MyChart if he/she has any questions or concerns regarding our  treatment plan. No barriers to understanding were identified. We discussed Red Flag symptoms and signs in detail. Patient expressed understanding regarding what to do in case of urgent or emergency type symptoms.   Medication list was reconciled, printed and provided to the patient in AVS. Patient instructions and summary information was reviewed with the patient as documented in the AVS. This note was prepared with assistance of Dragon voice recognition software. Occasional wrong-word or sound-a-like substitutions may have occurred due to the inherent limitations of voice recognition software  This visit occurred during the SARS-CoV-2 public health emergency.  Safety protocols were in place, including screening questions prior to the visit, additional usage of staff PPE, and extensive cleaning of exam room while observing appropriate contact time as indicated for disinfecting solutions.

## 2020-06-30 NOTE — Patient Instructions (Signed)
Please return in 12 months for your annual complete physical; please come fasting.   I will release your lab results to you on your MyChart account with further instructions. Please reply with any questions.    If you have any questions or concerns, please don't hesitate to send me a message via MyChart or call the office at [phone]. Thank you for visiting with us today! It's our pleasure caring for you.    

## 2020-07-01 ENCOUNTER — Other Ambulatory Visit: Payer: Self-pay

## 2020-07-01 ENCOUNTER — Other Ambulatory Visit: Payer: Self-pay | Admitting: Family Medicine

## 2020-07-01 ENCOUNTER — Telehealth: Payer: Self-pay

## 2020-07-01 MED ORDER — AZITHROMYCIN 250 MG PO TABS: ORAL_TABLET | ORAL | 0 refills | Status: DC

## 2020-07-01 NOTE — Telephone Encounter (Signed)
Patient given lab results. Requesting antibiotic be called into pharmacy

## 2020-07-01 NOTE — Telephone Encounter (Signed)
May order a Z-Pak.  Follow-up in office if not proving.

## 2020-07-01 NOTE — Telephone Encounter (Signed)
Patient aware that medication sent to pharmacy

## 2020-07-01 NOTE — Progress Notes (Signed)
Please call patient: I have reviewed his/her lab results. Labs show an elevated wbc which sometimes can be a marker of infection. Given her cough symptoms, I'd like her to return for reevaluation in one month for recheck after treating for allergies or sooner if cough persists or worsens or if she develops any signs of a sinus infection or bronchitis.

## 2020-07-01 NOTE — Telephone Encounter (Signed)
Patient called regarding lab results. Please return call

## 2020-07-02 NOTE — Telephone Encounter (Signed)
Medication not cover by insurance  Suggestion per pharmacy  0 0 flunisolide (NASALIDE) 25 MCG/ACT (0.025%) SOLN 0 0 fluticasone (FLONASE) 50 MCG/ACT nasal spray

## 2020-08-03 ENCOUNTER — Other Ambulatory Visit: Payer: Self-pay

## 2020-08-03 ENCOUNTER — Ambulatory Visit: Payer: 59 | Admitting: Family Medicine

## 2020-08-03 ENCOUNTER — Encounter: Payer: Self-pay | Admitting: Family Medicine

## 2020-08-03 VITALS — BP 130/74 | HR 82 | Temp 98.0°F | Wt 190.8 lb

## 2020-08-03 DIAGNOSIS — M79605 Pain in left leg: Secondary | ICD-10-CM

## 2020-08-03 DIAGNOSIS — M79604 Pain in right leg: Secondary | ICD-10-CM

## 2020-08-03 DIAGNOSIS — E669 Obesity, unspecified: Secondary | ICD-10-CM | POA: Diagnosis not present

## 2020-08-03 DIAGNOSIS — R748 Abnormal levels of other serum enzymes: Secondary | ICD-10-CM | POA: Diagnosis not present

## 2020-08-03 DIAGNOSIS — R030 Elevated blood-pressure reading, without diagnosis of hypertension: Secondary | ICD-10-CM | POA: Diagnosis not present

## 2020-08-03 DIAGNOSIS — J302 Other seasonal allergic rhinitis: Secondary | ICD-10-CM

## 2020-08-03 DIAGNOSIS — D72829 Elevated white blood cell count, unspecified: Secondary | ICD-10-CM

## 2020-08-03 NOTE — Patient Instructions (Signed)
Please return in Feb 2023 for your annual complete physical; please come fasting.  I will release your lab results to you on your MyChart account with further instructions. Please reply with any questions.    If you have any questions or concerns, please don't hesitate to send me a message via MyChart or call the office at [phone]. Thank you for visiting with Teresa Wells today! It's our pleasure caring for you.

## 2020-08-03 NOTE — Progress Notes (Signed)
Subjective  CC:  Chief Complaint  Patient presents with  . Follow-up    Blood pressure recheck   . Liver Function    Blood work for liver    HPI: Teresa Wells is a 60 y.o. female who presents to the office today to address the problems listed above in the chief complaint.  Follow-up elevated blood pressure: Patient's blood pressure is normalized.  No elevated readings at home.  At last visit she was ill believes that was the cause of her elevated blood pressure.  No chest pain.  Mildly elevated alkaline phosphatase on last lab work.  Patient is worried about her liver.  She denies right upper quadrant pain, jaundice or history of elevated LFTs.  She denies localized bone pain.  Seasonal allergies: Improving.  She continues on her allergy medications.  Her sinus infection did resolve with antibiotics.  Obesity: She has not been able to lose weight since her last visit.  However, she is now working on her diet.  She will be able to start walking as well.  Complains of bilateral leg pain mostly after long day at work.  She is on her feet all day on concrete floors.  She describes an aching.  She denies joint pain, muscle tenderness, significant swelling or injury.   Wt Readings from Last 3 Encounters:  08/03/20 190 lb 12.8 oz (86.5 kg)  06/30/20 189 lb 6.4 oz (85.9 kg)  07/08/19 189 lb 6.4 oz (85.9 kg)    The 10-year ASCVD risk score Mikey Bussing DC Jr., et al., 2013) is: 3.9%   Values used to calculate the score:     Age: 77 years     Sex: Female     Is Non-Hispanic African American: No     Diabetic: No     Tobacco smoker: No     Systolic Blood Pressure: 161 mmHg     Is BP treated: No     HDL Cholesterol: 55 mg/dL     Total Cholesterol: 212 mg/dL  Lab Results  Component Value Date   ALT 19 06/30/2020   AST 17 06/30/2020   ALKPHOS 172 (H) 06/30/2020   BILITOT 0.4 06/30/2020    Assessment  1. Elevated alkaline phosphatase level   2. Elevated blood pressure reading  without diagnosis of hypertension   3. Seasonal allergies   4. Obesity with body mass index 30 or greater   5. Pain in both lower extremities      Plan   Elevated alk phos: Repeat today and check GGT and calcium and parathyroid levels.  Normal TSH at last check.  Minimally elevated.  Reassured.  Further work-up if indicated pending results.  Elevated blood pressure reading has resolved.  We will continue to monitor.  Recommend low-fat diet.  Seasonal allergies: Improved.  Resolved sinusitis.  Continue Nasonex which seems to be working better for her.  Obesity: Nutrition counseling given.  Bilateral leg pain: Most consistent with muscle aches or mild osteoarthritis.  Recommend strength training, weight loss and follow-up if worsening.  Normal exam today.  Follow up: February for complete physical   Orders Placed This Encounter  Procedures  . Gamma GT  . Hepatic function panel  . PTH, Intact and Calcium   No orders of the defined types were placed in this encounter.     I reviewed the patients updated PMH, FH, and SocHx.    Patient Active Problem List   Diagnosis Date Noted  . Elevated alkaline phosphatase level  03/18/2019  . Obesity with body mass index 30 or greater 07/10/2017  . 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 Insomnia 10/03/2015  . Seasonal allergies 05/29/2012   Current Meds  Medication Sig  . acetaminophen (TYLENOL) 325 MG tablet Take 650 mg by mouth every 6 (six) hours as needed.  Marland Kitchen ascorbic acid (VITAMIN C) 1000 MG tablet Take 1,000 mg by mouth daily.  Marland Kitchen azelastine (ASTELIN) 0.1 % nasal spray Place 1 spray into both nostrils 2 (two) times daily.  . cetirizine (ZYRTEC) 10 MG tablet Take 10 mg by mouth daily. Reported on 10/03/2015  . Cholecalciferol (VITAMIN D3 PO) Take by mouth.  . flunisolide (NASALIDE) 25 MCG/ACT (0.025%) SOLN Place 1 spray into the nose 2 (two) times daily.  Marland Kitchen zinc gluconate 50 MG tablet Take 50 mg by mouth daily.  Marland Kitchen zolpidem  (AMBIEN) 5 MG tablet Take 1 tablet (5 mg total) by mouth at bedtime as needed. for sleep    Allergies: Patient is allergic to sulfasalazine, oxycodone, penicillins, and sulfa antibiotics. Family History: Patient family history includes Anxiety disorder in her mother; Depression in her mother; Diabetes in her father and mother; Heart disease in her brother, father, and mother; Hyperlipidemia in her father and mother; Hypertension in her brother and father; Obesity in her mother; Sleep apnea in her mother; Stroke in her father. Social History:  Patient  reports that she has never smoked. She has never used smokeless tobacco. She reports that she does not drink alcohol and does not use drugs.  Review of Systems: Constitutional: Negative for fever malaise or anorexia Cardiovascular: negative for chest pain Respiratory: negative for SOB or persistent cough Gastrointestinal: negative for abdominal pain  Objective  Vitals: BP 130/74 (BP Location: Right Arm, Patient Position: Sitting, Cuff Size: Large)   Pulse 82   Temp 98 F (36.7 C) (Temporal)   Wt 190 lb 12.8 oz (86.5 kg)   SpO2 96%   BMI 34.90 kg/m  General: no acute distress , A&Ox3 HEENT: PEERL, conjunctiva normal, neck is supple Cardiovascular:  RRR without murmur or gallop.  Respiratory:  Good breath sounds bilaterally, CTAB with normal respiratory effort Gastrointestinal: soft, flat abdomen, normal active bowel sounds, no palpable masses, no hepatosplenomegaly, no appreciated hernias Bilateral lower extremities: Normal joints, no muscle tenderness, no edema Skin:  Warm, no rashes     Commons side effects, risks, benefits, and alternatives for medications and treatment plan prescribed today were discussed, and the patient expressed understanding of the given instructions. Patient is instructed to call or message via MyChart if he/she has any questions or concerns regarding our treatment plan. No barriers to understanding were  identified. We discussed Red Flag symptoms and signs in detail. Patient expressed understanding regarding what to do in case of urgent or emergency type symptoms.   Medication list was reconciled, printed and provided to the patient in AVS. Patient instructions and summary information was reviewed with the patient as documented in the AVS. This note was prepared with assistance of Dragon voice recognition software. Occasional wrong-word or sound-a-like substitutions may have occurred due to the inherent limitations of voice recognition software  This visit occurred during the SARS-CoV-2 public health emergency.  Safety protocols were in place, including screening questions prior to the visit, additional usage of staff PPE, and extensive cleaning of exam room while observing appropriate contact time as indicated for disinfecting solutions.

## 2020-08-04 LAB — HEPATIC FUNCTION PANEL
ALT: 22 U/L (ref 0–35)
AST: 18 U/L (ref 0–37)
Albumin: 4.1 g/dL (ref 3.5–5.2)
Alkaline Phosphatase: 108 U/L (ref 39–117)
Bilirubin, Direct: 0 mg/dL (ref 0.0–0.3)
Total Bilirubin: 0.3 mg/dL (ref 0.2–1.2)
Total Protein: 7 g/dL (ref 6.0–8.3)

## 2020-08-04 LAB — GAMMA GT: GGT: 33 U/L (ref 7–51)

## 2020-08-04 LAB — PTH, INTACT AND CALCIUM
Calcium: 9.3 mg/dL (ref 8.6–10.4)
PTH: 37 pg/mL (ref 16–77)

## 2020-11-01 LAB — HM MAMMOGRAPHY

## 2020-11-24 ENCOUNTER — Encounter: Payer: Self-pay | Admitting: Family Medicine

## 2020-12-29 ENCOUNTER — Other Ambulatory Visit: Payer: Self-pay | Admitting: Family Medicine

## 2020-12-29 NOTE — Telephone Encounter (Signed)
Pt requesting refill for Zolpidem. Last OV 08/03/2020.

## 2021-03-02 ENCOUNTER — Other Ambulatory Visit: Payer: Self-pay | Admitting: Physician Assistant

## 2021-04-05 ENCOUNTER — Ambulatory Visit: Payer: 59 | Admitting: Family Medicine

## 2021-05-11 ENCOUNTER — Ambulatory Visit (INDEPENDENT_AMBULATORY_CARE_PROVIDER_SITE_OTHER): Payer: 59 | Admitting: Family Medicine

## 2021-05-11 ENCOUNTER — Encounter: Payer: Self-pay | Admitting: Family Medicine

## 2021-05-11 ENCOUNTER — Other Ambulatory Visit: Payer: Self-pay

## 2021-05-11 ENCOUNTER — Other Ambulatory Visit: Payer: Self-pay | Admitting: Family Medicine

## 2021-05-11 ENCOUNTER — Telehealth: Payer: Self-pay | Admitting: *Deleted

## 2021-05-11 VITALS — BP 122/82 | HR 70 | Temp 98.1°F | Ht 63.0 in | Wt 191.8 lb

## 2021-05-11 DIAGNOSIS — E669 Obesity, unspecified: Secondary | ICD-10-CM

## 2021-05-11 DIAGNOSIS — F4321 Adjustment disorder with depressed mood: Secondary | ICD-10-CM | POA: Diagnosis not present

## 2021-05-11 DIAGNOSIS — F5101 Primary insomnia: Secondary | ICD-10-CM

## 2021-05-11 MED ORDER — TIRZEPATIDE 2.5 MG/0.5ML ~~LOC~~ SOAJ: 2.5000 mg | SUBCUTANEOUS | 0 refills | Status: DC

## 2021-05-11 MED ORDER — TIRZEPATIDE 5 MG/0.5ML ~~LOC~~ SOAJ: 5.0000 mg | SUBCUTANEOUS | 1 refills | Status: DC

## 2021-05-11 NOTE — Patient Instructions (Signed)
Please return in 3 months for your physical.   You are up to date on your pap smear (due 2024) and your colonoscopy is due at the end of this year.   We will try to get the mounjaro covered. Please look online for coupons.   If you have any questions or concerns, please don't hesitate to send me a message via MyChart or call the office at [phone]. Thank you for visiting with [place] today! It's our pleasure caring for you.

## 2021-05-11 NOTE — Telephone Encounter (Signed)
Key: [id number] - PA Case ID: [id number] Status Sent to Plan today Drug Mounjaro 2.5MG /0.5ML pen-injectors Waiting for determination

## 2021-05-11 NOTE — Progress Notes (Signed)
Subjective  CC:  Chief Complaint  Patient presents with   Weight Loss    Would like to discuss.   Insomnia    she is not able to sleep.     Depression    HPI: Teresa Wells is a 61 y.o. female who presents to the office today to address the problems listed above in the chief complaint. Primary insomnia: Has been on Ambien for the last 2 to 3 years.  Usually works well.  Typically cuts it in half.  No longer helping her sleep.  She is on a 5 mg tablet.  Increased stressors with life transitions, working towards retirement.  Her husband's bladder cancer recently resurfaces.  Stress is definitely affecting her sleep Obesity: Has tried to lose weight over the last several years.  Has been on phentermine without persistent benefit.  Tries to eat healthy.  Limited exercise.  Increased stress eating due to above noted changes.  Husband GLP-1 and she would like to try this. Wt Readings from Last 3 Encounters:  05/11/21 191 lb 12.8 oz (87 kg)  08/03/20 190 lb 12.8 oz (86.5 kg)  06/30/20 189 lb 6.4 oz (85.9 kg)   Mood: Difficulty with focus and negative mood symptoms.  No history of depression.  Feeling down mostly because her weight.  Also lack of sleep is making it difficult to focus.  She is a strong person.  Believes much of the issues are coming from working towards retirement changes that will bring.  No suicidal ideation.  No panic symptoms Depression screen [facility] 2/9 05/11/2021 06/30/2020 03/18/2019 03/18/2019 10/21/2017  Decreased Interest 2 0 0 0 2  Down, Depressed, Hopeless 2 0 0 0 2  PHQ - 2 Score 4 0 0 0 4  Altered sleeping 2 - 0 - 3  Tired, decreased energy 2 - 0 - 2  Change in appetite 3 - 0 - 3  Feeling bad or failure about yourself  3 - 0 - 2  Trouble concentrating 1 - 0 - 2  Moving slowly or fidgety/restless 0 - 0 - 0  Suicidal thoughts 0 - 0 - 0  PHQ-9 Score 15 - 0 - 16  Difficult doing work/chores Somewhat difficult - - - -      Assessment  1. Primary insomnia   2.  Obesity with body mass index 30 or greater   3. Adjustment disorder with depressed mood      Plan  Primary insomnia: Recommend starting for 5 mg nightly.  This is worked well for her in the past. Adjustment disorder with depressed mood: Counseling done.  Stress related to transition of life.  Declines medication treatment at this time.  We will work on wellness: Weight loss and exercise Obesity: Failed phentermine.  We will try GLP-1.  Need prior authorization.  Recommend strength training.  Follow up: 3 months for complete physical Visit date not found .  Specifically stated also I No orders of the defined types were placed in this encounter.  Meds ordered this encounter  Medications   tirzepatide (MOUNJARO) 2.5 MG/0.5ML Pen    Sig: Inject 2.5 mg into the skin once a week.    Dispense:  2 mL    Refill:  0   tirzepatide (MOUNJARO) 5 MG/0.5ML Pen    Sig: Inject 5 mg into the skin once a week.    Dispense:  6 mL    Refill:  1      I reviewed the patients updated PMH,  FH, and SocHx.    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   ascorbic acid (VITAMIN C) 1000 MG tablet Take 1,000 mg by mouth daily.   azelastine (ASTELIN) 0.1 % nasal spray Place 1 spray into both nostrils 2 (two) times daily.   cetirizine (ZYRTEC) 10 MG tablet Take 10 mg by mouth daily. Reported on 10/03/2015   Cholecalciferol (VITAMIN D3 PO) Take by mouth.   tirzepatide (MOUNJARO) 2.5 MG/0.5ML Pen Inject 2.5 mg into the skin once a week.   tirzepatide [facility]) 5 MG/0.5ML Pen Inject 5 mg into the skin once a week.   zinc gluconate 50 MG tablet Take 50 mg by mouth daily.   zolpidem  (AMBIEN) 5 MG tablet TAKE 1 TABLET (5 MG TOTAL) BY MOUTH AT BEDTIME AS NEEDED. FOR SLEEP    Allergies: Patient is allergic to sulfasalazine, oxycodone, penicillins, and sulfa antibiotics. Family History: Patient family history includes Anxiety disorder in her mother; Depression in her mother; Diabetes in her father and mother; Heart disease in her brother, father, and mother; Hyperlipidemia in her father and mother; Hypertension in her brother and father; Obesity in her mother; Sleep apnea in her mother; Stroke in her father. Social History:  Patient  reports that she has never smoked. She has never used smokeless tobacco. She reports that she does not drink alcohol and does not use drugs.  Review of Systems: Constitutional: Negative for fever malaise or anorexia Cardiovascular: negative for chest pain Respiratory: negative for SOB or persistent cough Gastrointestinal: negative for abdominal pain  Objective  Vitals: BP 122/82    Pulse 70    Temp 98.1 F (36.7 C) (Temporal)    Ht 5\' 3"  (1.6 m)    Wt 191 lb 12.8 oz (87 kg)    SpO2 98%    BMI 33.98 kg/m  General: no acute distress , A&Ox3 Mood: nl affect, good insight  Commons side effects, risks, benefits, and alternatives for medications and treatment plan prescribed today were discussed, and the patient expressed understanding of the given instructions. Patient is instructed to call or message via MyChart if he/she has any questions or concerns regarding our treatment plan. No barriers to understanding were identified. We discussed Red Flag symptoms and signs in detail. Patient expressed understanding regarding what to do in case of urgent or emergency type symptoms.  Medication list was reconciled, printed and provided to the patient in AVS. Patient instructions and summary information was reviewed with the patient as documented in the AVS. This note was prepared with assistance of Dragon voice recognition software. Occasional wrong-word or  sound-a-like substitutions may have occurred due to the inherent limitations of voice recognition software  This visit occurred during the SARS-CoV-2 public health emergency.  Safety protocols were in place, including screening questions prior to the visit, additional usage of staff PPE, and extensive cleaning of exam room while observing appropriate contact time as indicated for disinfecting solutions.

## 2021-05-11 NOTE — Telephone Encounter (Signed)
Thank you for doing this PA!

## 2021-05-11 NOTE — Telephone Encounter (Signed)
PA send today

## 2021-05-12 NOTE — Telephone Encounter (Signed)
[id number]. MOUNJARO INJ 2.5/0.5 is denied for not meeting the prior authorization requirement(s).

## 2021-05-17 ENCOUNTER — Telehealth: Payer: Self-pay

## 2021-05-17 NOTE — Telephone Encounter (Signed)
Patient has called in stating that Dr. Jonni Sanger has prescribed [facility] and insurance is not covering.  States they have given her a form that needs to be completed.  Does not know if this form is a PA or not.  States will be faxing it over to be completed.

## 2021-05-29 NOTE — Telephone Encounter (Signed)
Spoke with patient stated will like to try phentermine

## 2021-05-29 NOTE — Telephone Encounter (Signed)
Called patient stated patient call insurance and was told Mounjaro not cover  Requesting if is possible Rx for weight loss  Please advise

## 2021-05-29 NOTE — Telephone Encounter (Signed)
Deniedon January 12 Request Reference Number: [id number]. MOUNJARO INJ 2.5/0.5 is denied for not meeting the prior authorization requirement(s)

## 2021-06-05 ENCOUNTER — Other Ambulatory Visit: Payer: Self-pay | Admitting: Family Medicine

## 2021-06-05 NOTE — Telephone Encounter (Signed)
Ordered. See med refill

## 2021-06-05 NOTE — Telephone Encounter (Signed)
The original prescription was discontinued on 06/30/2020 by Leamon Arnt, MD. Renewing this prescription may not be appropriate.

## 2021-07-30 ENCOUNTER — Other Ambulatory Visit: Payer: Self-pay | Admitting: Family Medicine

## 2021-08-16 ENCOUNTER — Ambulatory Visit: Payer: Self-pay

## 2021-08-16 ENCOUNTER — Ambulatory Visit: Payer: 59 | Admitting: Family Medicine

## 2021-08-16 ENCOUNTER — Ambulatory Visit (INDEPENDENT_AMBULATORY_CARE_PROVIDER_SITE_OTHER): Payer: 59

## 2021-08-16 VITALS — BP 136/90 | HR 77 | Ht 63.0 in | Wt 190.0 lb

## 2021-08-16 DIAGNOSIS — M25531 Pain in right wrist: Secondary | ICD-10-CM | POA: Diagnosis not present

## 2021-08-16 NOTE — Progress Notes (Signed)
? ?  Rito Ehrlich, am serving as a Education administrator for Dr. Lynne Leader. ? ?Subjective:   ? ?CC: R wrist pain ? ?HPI: Pt is a 61 y/o female c/o R wrist pain since Monday. MOI: picking up/rotating a box from a pallet to label it. Pt locates pain to medial wrist pain that has started radiating up her Right arm to her elbow. Patient describes pain as sharp when she tries to do anything and a constant aching pain all the time.  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  She was demonstrating to an employee how to do a task when she became injured. ? ?R wrist swelling: no ?Grip strength: not great  ?Numbness/tingling: yes started today ?Aggravates: bending wrist, picking up anything  ?Treatments tried: heat, tylenol ? ?Pertinent review of Systems: No fevers or chills ? ?Relevant historical information: Insomnia.  Obesity. ? ? ?Objective:   ? ?Vitals:  ? 08/16/21 1440  ?BP: 136/90  ?Pulse: 77  ?SpO2: 97%  ? ?General: Well Developed, well nourished, and in no acute distress.  ? ?MSK: Right wrist slight swelling at the volar ulnar wrist otherwise normal. ?Tender palpation at FCU tendon area and TFCC. ?Decreased range of motion with pain with flexion and ulnar deviation. ?Decreased strength pain with resisted wrist flexion and ulnar deviation. ?Pulses cap refill and sensation are intact distally. ? ?Lab and Radiology Results ? ?Diagnostic Limited MSK Ultrasound of: Right wrist ?FCU tendon visualized.  No visible tear.  Minimal hypoechoic fluid near tendon. ?Intact bony structures. ?TFCC visualized without visible tear. ?Impression: Probable FCU tendinitis. ? ?X-ray images right wrist obtained today personally and independently interpreted ?No acute fractures.  Minimal degenerative changes present in the wrist especially at the radial scaphoid joint. ?Await formal radiology review ? ? ?Impression and Recommendations:   ? ?Assessment and Plan: ?61 y.o. female with  right wrist pain located at the ulnar aspect of the wrist.  Patient is thought to have FCU tendinitis or strain.  Plan for thumb spica splint/brace Voltaren gel and home exercise program.  Check back in a month.  If not improving consider formal hand therapy.. ? ?PDMP not reviewed this encounter. ?Orders Placed This Encounter  ?Procedures  ? [place] LIMITED JOINT SPACE STRUCTURES UP RIGHT(NO LINKED CHARGES)  ?  Standing Status:   Future  ?  Number of Occurrences:   1  ?  Standing Expiration Date:   02/15/2022  ?  Order Specific Question:   Reason for Exam (SYMPTOM  OR DIAGNOSIS REQUIRED)  ?  Answer:   right wrist pain  ?  Order Specific Question:   Preferred imaging location?  ?  Answer:   [facility]  ? DG Wrist Complete Right  ?  Standing Status:   Future  ?  Number of Occurrences:   1  ?  Standing Expiration Date:   08/17/2022  ?  Order Specific Question:   Reason for Exam (SYMPTOM  OR DIAGNOSIS REQUIRED)  ?  Answer:   right wrist pain  ?  Order Specific Question:   Preferred imaging location?  ?  Answer:   [facility]  ? ?No orders of the defined types were placed in this encounter. ? ? ?Discussed warning signs or symptoms. Please see discharge instructions. Patient expresses understanding. ? ? ?The above documentation has been reviewed and is accurate and complete Lynne Leader, M.D. ? ?

## 2021-08-16 NOTE — Patient Instructions (Signed)
Good to see you  ?Thumb spica brace from [facility] ?X ray on the way out ?Voltaren gel over the counter ?Follow up in a month  ? ?Flexor Counselling psychologist Radialis Tendinitis Rehab ?Ask your health care provider which exercises are safe for you. Do exercises exactly as told by your health care provider and adjust them as directed. It is normal to feel mild stretching, pulling, tightness, or discomfort as you do these exercises. Stop right away if you feel sudden pain or your pain gets worse. Do not begin these exercises until told by your health care provider. ?Range-of-motion exercises ?These exercises warm up your muscles and joints and improve the movement and flexibility of your forearm. These exercises also help to relieve pain, numbness, and tingling. They are done using the muscles in your injured forearm. ?Wrist flexion ?Sit or stand with your left / right elbow bent to a 90-degree angle (right angle) at your side and your palm facing the floor. ?Slowly bend your wrist so that your fingers move toward the floor (flexion), stopping when you feel a gentle stretch over the back of your forearm. ?Hold this position for __________ seconds. ?Slowly return to the starting position. ?Repeat __________ times. Complete this exercise __________ times a day. ?Wrist extension ?Sit or stand with your left / right elbow bent to a 90-degree angle (right angle) at your side and your palm facing the floor. ?Slowly lift your wrist up so that your fingers move toward the ceiling (extension), stopping when you feel a gentle stretch on the palm side of your forearm. ?Hold this position for __________ seconds. ?Slowly return to the starting position. ?Repeat __________ times. Complete this exercise __________ times a day. ?Forearm rotation, supination ?Sit or stand with your left / right elbow bent to a 90-degree angle (right angle) at your side. Position your forearm so that the thumb is facing the ceiling  (neutral position). ?Turn (rotate) your palm up toward the ceiling (supination) until you feel a gentle stretch on the inside of your forearm. ?Hold this position for __________ seconds. ?Slowly return your palm to the starting position. ?Repeat __________ times. Complete this exercise __________ times a day. ?Forearm rotation, pronation ?Sit or stand with your left / right elbow bent to a 90-degree angle (right angle) at your side. Position your forearm so that the thumb is facing the ceiling (neutral position). ?Turn (rotate) your palm down toward the floor (pronation) until you feel a gentle stretch on the top of your forearm. ?Hold this position for __________ seconds. ?Slowly return your palm to the starting position. ?Repeat __________ times. Complete this exercise __________ times a day. ?Stretching exercises ?These exercises warm up your muscles and joints and improve the movement and flexibility of your forearm. These exercises also help to relieve pain, numbness, and tingling. They are done using your healthy forearm to help stretch the muscles in your injured forearm. ?Wrist flexion, assisted ? ?Sit or stand and extend your left / right arm in front of you. Turn your palm down toward the floor and relax your wrist. ?With your other hand (assisted), gently push on the back of your hand to bend your wrist and fingers toward the floor (flexion). Stop when you feel a gentle stretch on the top of your forearm. ?Hold this position for __________ seconds. ?Slowly return to the starting position. ?Repeat __________ times. Complete this exercise __________ times a day. ?Wrist extension, assisted ? ?Sit or stand and extend your  left / right arm in front of you. Turn your palm up toward the ceiling and relax your wrist. ?With your other hand (assisted), gently pull your palm and fingertips back so your fingers point down toward the floor (extension). You should feel a gentle stretch on the palm-side of your  forearm. ?Hold this position for __________ seconds. ?Slowly return to the starting position. ?Repeat __________ times. Complete this exercise __________ times a day. ?Forearm rotation, supination ?Sit with your left / right elbow bent to a 90-degree angle (right angle) with your forearm resting on a table. ?Keeping your upper body and shoulder still, use your other hand to turn (rotate) your forearm palm-up (supination) until you feel a gentle to moderate stretch. ?Hold this position for __________ seconds. ?Slowly release the stretch, and return to the starting position. ?Repeat __________ times. Complete this exercise __________ times a day. ?Forearm rotation, pronation ?Sit with your left / right elbow bent to a 90-degree angle (right angle) with your forearm resting on a table. ?Keeping your upper body and shoulder still, use your other hand to turn (rotate) your forearm palm down (pronation) until you feel a gentle to moderate stretch. ?Hold this position for __________ seconds. ?Slowly release the stretch, and return to the starting position. ?Repeat __________ times. Complete this exercise __________ times a day. ?Strengthening exercises ?These exercises build strength and endurance in your wrist and forearm. Endurance is the ability to use your muscles for a long time, even after they get tired. ?Wrist flexion ? ?Sit with your left / right forearm supported on a table and your hand resting palm-up over the edge of the table. Your elbow should be bent to a 90-degree angle (right angle) and rest below the level of your shoulder. ?Hold a __________ weight in your hand. Or, hold a rubber exercise band or tube in both hands, keeping your hands at the same level and hip distance apart. There should be a slight tension in the exercise band or tube. ?Slowly lift your hand up toward the ceiling (flexion). ?Hold this position for __________ seconds. ?Slowly lower your hand back to the starting position. ?Repeat  __________ times. Complete this exercise __________ times a day. ?Wrist extension ? ?Sit with your left / right forearm supported on a table and your hand resting palm-down over the edge of the table. Your elbow should be bent to a 90-degree angle (right angle) and rest below the level of your shoulder. ?Hold a __________ weight in your hand. Or, hold a rubber exercise band or tube in both hands, keeping your hands at the same level and hip distance apart. There should be a slight tension in the exercise band or tube. ?Slowly lift your hand up toward the ceiling (extension). ?Hold this position for __________ seconds. ?Slowly lower your hand back to the starting position. ?Repeat __________ times. Complete this exercise __________ times a day. ?Ulnar deviation ?Stand with a __________ weight in your left / right hand. Or, sit with your healthy hand supported, and hold on to a rubber exercise band or tube with both hands. There should be a slight tension in the exercise band or tube. ?Move your wrist so your pinkie travels toward your forearm and your thumb moves away from your forearm (ulnar deviation). ?Hold this position for __________ seconds. ?Slowly return to the starting position. ?Repeat __________ times. Complete this exercise __________ times a day. ?Radial deviation ?Stand with a __________ weight in your left / right hand. Or, sit and  hold on to a rubber exercise band or tube with both hands while your left / right arm is supported on a table and the other arm is below the table. There should be a slight tension in the exercise band or tube. ?If you are holding a weight, raise your left / right hand so your thumb moves toward your forearm at a comfortable height. You will not need to raise your hand very far. ?If you are holding an exercise band or tube, pull on it to raise your thumb toward the ceiling. You will not need to raise your hand very far. ?Hold this position for __________ seconds. ?Slowly  lower your wrist to the starting position. ?Repeat __________ times. Complete this exercise __________ times a day. ?Forearm rotation, supination ? ?Sit with your left / right forearm supported on a table. Keep your

## 2021-08-21 NOTE — Progress Notes (Signed)
Wrist x-ray shows mild arthritis

## 2021-08-22 ENCOUNTER — Encounter: Payer: Self-pay | Admitting: Family Medicine

## 2021-08-22 ENCOUNTER — Ambulatory Visit (INDEPENDENT_AMBULATORY_CARE_PROVIDER_SITE_OTHER): Payer: 59 | Admitting: Family Medicine

## 2021-08-22 VITALS — BP 140/86 | HR 72 | Temp 98.3°F | Ht 63.0 in | Wt 189.0 lb

## 2021-08-22 DIAGNOSIS — F5101 Primary insomnia: Secondary | ICD-10-CM | POA: Diagnosis not present

## 2021-08-22 DIAGNOSIS — Z0000 Encounter for general adult medical examination without abnormal findings: Secondary | ICD-10-CM

## 2021-08-22 DIAGNOSIS — E669 Obesity, unspecified: Secondary | ICD-10-CM

## 2021-08-22 DIAGNOSIS — F4321 Adjustment disorder with depressed mood: Secondary | ICD-10-CM

## 2021-08-22 LAB — LIPID PANEL
Cholesterol: 286 mg/dL — ABNORMAL HIGH (ref 0–200)
HDL: 69.2 mg/dL (ref >39.00)
LDL Cholesterol: 194 mg/dL — ABNORMAL HIGH (ref 0–99)
NonHDL: 216.69
Total CHOL/HDL Ratio: 4
Triglycerides: 111 mg/dL (ref 0.0–149.0)
VLDL: 22.2 mg/dL (ref 0.0–40.0)

## 2021-08-22 LAB — HEMOGLOBIN A1C: Hgb A1c MFr Bld: 5.6 % (ref 4.6–6.5)

## 2021-08-22 LAB — COMPREHENSIVE METABOLIC PANEL
ALT: 19 U/L (ref 0–35)
AST: 21 U/L (ref 0–37)
Albumin: 4.5 g/dL (ref 3.5–5.2)
Alkaline Phosphatase: 100 U/L (ref 39–117)
BUN: 10 mg/dL (ref 6–23)
CO2: 28 mEq/L (ref 19–32)
Calcium: 9.6 mg/dL (ref 8.4–10.5)
Chloride: 101 mEq/L (ref 96–112)
Creatinine, Ser: 0.75 mg/dL (ref 0.40–1.20)
GFR: 85.1 mL/min (ref >60.00)
Glucose, Bld: 80 mg/dL (ref 70–99)
Potassium: 4 mEq/L (ref 3.5–5.1)
Sodium: 138 mEq/L (ref 135–145)
Total Bilirubin: 0.7 mg/dL (ref 0.2–1.2)
Total Protein: 7.1 g/dL (ref 6.0–8.3)

## 2021-08-22 LAB — CBC WITH DIFFERENTIAL/PLATELET
Basophils Absolute: 0.1 10*3/uL (ref 0.0–0.1)
Basophils Relative: 0.7 % (ref 0.0–3.0)
Eosinophils Absolute: 0.2 10*3/uL (ref 0.0–0.7)
Eosinophils Relative: 2.5 % (ref 0.0–5.0)
HCT: 43 % (ref 36.0–46.0)
Hemoglobin: 14.4 g/dL (ref 12.0–15.0)
Lymphocytes Relative: 34.6 % (ref 12.0–46.0)
Lymphs Abs: 3 10*3/uL (ref 0.7–4.0)
MCHC: 33.4 g/dL (ref 30.0–36.0)
MCV: 92.2 fl (ref 78.0–100.0)
Monocytes Absolute: 0.6 10*3/uL (ref 0.1–1.0)
Monocytes Relative: 7.1 % (ref 3.0–12.0)
Neutro Abs: 4.8 10*3/uL (ref 1.4–7.7)
Neutrophils Relative %: 55.1 % (ref 43.0–77.0)
Platelets: 264 10*3/uL (ref 150.0–400.0)
RBC: 4.66 Mil/uL (ref 3.87–5.11)
RDW: 13.3 % (ref 11.5–15.5)
WBC: 8.8 10*3/uL (ref 4.0–10.5)

## 2021-08-22 LAB — TSH: TSH: 2.67 u[IU]/mL (ref 0.35–5.50)

## 2021-08-22 MED ORDER — ZOLPIDEM TARTRATE 5 MG PO TABS: 5.0000 mg | ORAL_TABLET | Freq: Every evening | ORAL | 5 refills | Status: DC | PRN

## 2021-08-22 NOTE — Progress Notes (Signed)
?Subjective  ?Chief Complaint  ?Patient presents with  ? Annual Exam  ?  Pt here for Annual exam and not currently fasting. Pt stated that she does not have no energy.  ? ? ?HPI: Teresa Wells is a 61 y.o. female who presents to [facility] today for a Female Wellness Visit. She also has the concerns and/or needs as listed above in the chief complaint. These will be addressed in addition to the Health Maintenance Visit.  ? ?Wellness Visit: annual visit with health maintenance review and exam without Pap ? ?Health maintenance: Screens are current.  Immunizations are up-to-date.  Mammogram will be due in July and colonoscopy will be due later this year.  Overall she is doing well ?Chronic disease f/u and/or acute problem visit: (deemed necessary to be done in addition to the wellness visit): ?Primary insomnia with adjustment anxiety with depression.  We started back Ambien 5 mg nightly and this is worked well for her sleep.  She is much more rested.  Her husband's bladder cancer has recurred and he is having bladder surgery this week.  Overall they are coping well although she does have some anxiety related to this.  She feels her mood is overall well controlled and does not need any help with this with medication therapy. ?Obesity: Started Mounjaro 2.5 mg weekly 2 weeks ago.  Tolerating well.  She pays out-of-pocket for this.  She is hopeful that this can help get her on track. ? ?Assessment  ?1. Annual physical exam   ?2. Primary insomnia   ?3. Adjustment disorder with depressed mood   ?4. Obesity with body mass index 30 or greater   ? ?  ?Plan  ?Female Wellness Visit: ?Age appropriate Health Maintenance and Prevention measures were discussed with patient. Included topics are cancer screening recommendations, ways to keep healthy (see AVS) including dietary and exercise recommendations, regular eye and dental care, use of seat belts, and avoidance of moderate alcohol use and tobacco  use.  ?BMI: discussed patient's BMI and encouraged positive lifestyle modifications to help get to or maintain a target BMI. ?HM needs and immunizations were addressed and ordered. See below for orders. See HM and immunization section for updates. ?Routine labs and screening tests ordered including cmp, cbc and lipids where appropriate. ?Discussed recommendations regarding Vit D and calcium supplementation (see AVS) ? ?Chronic disease management visit and/or acute problem visit: ?Primary insomnia: Well-controlled with Ambien 5 mg nightly. ?Obesity: Discussed diet.  Mounjaro 2.5 mg weekly for 1 month then increase to 5 mg weekly.  She will see if her insurance will cover [phone].  We will monitor her weight and recheck in 6 months. ?We will monitor blood pressure, check A1c and thyroid and lipids today. ? ?Follow up: 6 to 12 months for recheck/physical ?Orders Placed This Encounter  ?Procedures  ? CBC with Differential/Platelet  ? Comp Met (CMET)  ? Lipid Profile  ? Hemoglobin A1c  ? TSH  ? ?Meds ordered this encounter  ?Medications  ? zolpidem (AMBIEN) 5 MG tablet  ?  Sig: Take 1 tablet (5 mg total) by mouth at bedtime as needed. for sleep  ?  Dispense:  30 tablet  ?  Refill:  5  ?  Not to exceed 5 additional fills before 06/28/2021  ? ?  ? ?Body mass index is 33.48 kg/m?. ?Wt Readings from Last 3 Encounters:  ?08/22/21 189 lb (85.7 kg)  ?08/16/21 190 lb (86.2 kg)  ?05/11/21 191 lb  12.8 oz (87 kg)  ? ? ? ?Patient Active Problem List  ? Diagnosis Date Noted  ? Elevated alkaline phosphatase level 03/18/2019  ?  129 03/2019: mild; recheck was normal. Then again 179 05/2020;  ? ?  ? Obesity with body mass index 30 or greater 07/10/2017  ? 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 Insomnia 10/03/2015  ?  Zolpidem prn, has used nortriptyline in the past ? Potential benefits of a long term benzodiazepines  use as well as potential risks  and complications were explained to the patient and were aknowledged. ?  ? Seasonal  allergies 05/29/2012  ? ?Health Maintenance  ?Topic Date Due  ? COVID-19 Vaccine (3 - Pfizer risk series) 02/15/2020  ? MAMMOGRAM  11/01/2021  ? TETANUS/TDAP  11/04/2021  ? INFLUENZA VACCINE  11/28/2021  ? COLONOSCOPY (Pts 45-65yr Insurance coverage will need to be confirmed)  03/10/2022  ? PAP SMEAR-Modifier  08/29/2022  ? Hepatitis C Screening  Completed  ? HIV Screening  Completed  ? Zoster Vaccines- Shingrix  Completed  ? HPV VACCINES  Aged Out  ? ?Immunization History  ?Administered Date(s) Administered  ? Influenza Split 01/28/2017  ? Influenza,inj,Quad PF,6+ Mos 02/25/2018, 01/27/2019, 02/27/2020, 12/31/2020  ? Influenza-Unspecified 02/15/2014, 03/18/2015  ? PFIZER(Purple Top)SARS-COV-2 Vaccination 12/28/2019, 01/18/2020  ? Pneumococcal Conjugate-13 01/27/2019  ? Pneumococcal Polysaccharide-23 02/25/2018  ? Tdap 11/05/2011  ? Zoster Recombinat (Shingrix) 05/15/2019, 07/26/2019  ? ?We updated and reviewed the patient's past history in detail and it is documented below. ?Allergies: ?Patient is allergic to sulfasalazine, oxycodone, penicillins, and sulfa antibiotics. ?Past Medical History ?Patient  has a past medical history of Acute meniscal tear of knee (LEFT), Allergy, Anemia, Chronic respiratory failure with hypoxia ([place] (07/26/2017), Constipation, and Insomnia. ?Past Surgical History ?Patient  has a past surgical history that includes Bilateral salpingoophorectomy (2009); Hysteroscopy with resectoscope (2006); Knee arthroscopy (05/04/2011); Meniscus debridement (05/04/2011); Breast fibroadenoma surgery (2001); Incisional hernia repair (N/A, 11/12/2013); Insertion of mesh (N/A, 11/12/2013); Mole removal (Left, 11/12/2013); Open reduction internal fixation (orif) distal radial fracture (Left, 08/27/2014); and Carpal tunnel release (Left, 08/27/2014). ?Family History: ?Patient family history includes Anxiety disorder in her mother; Depression in her mother; Diabetes in her father and mother; Heart disease in her  brother, father, and mother; Hyperlipidemia in her father and mother; Hypertension in her brother and father; Obesity in her mother; Sleep apnea in her mother; Stroke in her father. ?Social History:  ?Patient  reports that she has never smoked. She has never used smokeless tobacco. She reports that she does not drink alcohol and does not use drugs. ? ?Review of Systems: ?Constitutional: negative for fever or malaise ?Ophthalmic: negative for photophobia, double vision or loss of vision ?Cardiovascular: negative for chest pain, dyspnea on exertion, or new LE swelling ?Respiratory: negative for SOB or persistent cough ?Gastrointestinal: negative for abdominal pain, change in bowel habits or melena ?Genitourinary: negative for dysuria or gross hematuria, no abnormal uterine bleeding or disharge ?Musculoskeletal: negative for new gait disturbance or muscular weakness ?Integumentary: negative for new or persistent rashes, no breast lumps ?Neurological: negative for TIA or stroke symptoms ?Psychiatric: negative for SI or delusions ?Allergic/Immunologic: negative for hives ? ?Patient Care Team  ?  Relationship Specialty Notifications Start End  ?ALeamon Arnt MD PCP - General Family Medicine  06/26/17   ?RDelsa Bern MD Consulting Physician Obstetrics and Gynecology  09/03/17   ?PMelvenia Needles NP Nurse Practitioner Pulmonary Disease  09/03/17   ? ? ?Objective  ?Vitals: BP 140/86  Pulse 72   Temp 98.3 ?F (36.8 ?C)   Ht _0  (1.6 m)   Wt 189 lb (85.7 kg)   SpO2 97%   BMI 33.48 kg/m?  ?General:  Well developed, well nourished, no acute distress  ?Psych:  Alert and orientedx3,normal mood and affect ?HEENT:  Normocephalic, atraumatic, non-icteric sclera,  supple neck without adenopathy, mass or thyromegaly ?Cardiovascular:  Normal S1, S2, RRR without gallop, rub or murmur ?Respiratory:  Good breath sounds bilaterally, CTAB with normal respiratory effort ?Gastrointestinal: normal bowel sounds, soft, non-tender, no  noted masses. No HSM ?MSK: no deformities, contusions. Joints are without erythema or swelling.  ?Skin:  Warm, no rashes or suspicious lesions noted ?Neurologic:    Mental status is normal. CN 2-11 are normal. Gross mo

## 2021-08-22 NOTE — Patient Instructions (Signed)
Please return in 3 months to recheck weight  ? ?I will release your lab results to you on your MyChart account with further instructions. You may see the results before I do, but when I review them I will send you a message with my report or have my assistant call you if things need to be discussed. Please reply to my message with any questions. Thank you!  ? ?Can call insurance and ask if they cover [place] for weight loss. I've added an a1c to check for prediabetes as well. thanks ? ?If you have any questions or concerns, please don't hesitate to send me a message via MyChart or call the office at [phone]. Thank you for visiting with [place] today! It's our pleasure caring for you.  ? ?Please do these things to maintain good health! ? ?Exercise at least 30-45 minutes a day,  4-5 days a week.  ?Eat a low-fat diet with lots of fruits and vegetables, up to 7-9 servings per day. ?Drink plenty of water daily. Try to drink 8 8oz glasses per day. ?Seatbelts can save your life. Always wear your seatbelt. ?Place Smoke Detectors on every level of your home and check batteries every year. ?Schedule an appointment with an eye doctor for an eye exam every 1-2 years ?Safe sex - use condoms to protect yourself from STDs if you could be exposed to these types of infections. Use birth control if you do not want to become pregnant and are sexually active. ?Avoid heavy alcohol use. If you drink, keep it to less than 2 drinks/day and not every day. ?[place].  Choose someone you trust that could speak for you if you became unable to speak for yourself. ?Depression is common in our stressful world.If you're feeling down or losing interest in things you normally enjoy, please come in for a visit. ?If anyone is threatening or hurting you, please get help. Physical or Emotional Violence is never OK.   ?

## 2021-08-25 ENCOUNTER — Other Ambulatory Visit: Payer: Self-pay

## 2021-08-25 DIAGNOSIS — E785 Hyperlipidemia, unspecified: Secondary | ICD-10-CM

## 2021-08-25 MED ORDER — ROSUVASTATIN CALCIUM 10 MG PO TABS: 10.0000 mg | ORAL_TABLET | Freq: Every evening | ORAL | 3 refills | Status: DC

## 2021-09-20 ENCOUNTER — Ambulatory Visit: Payer: 59 | Admitting: Family Medicine

## 2021-11-07 LAB — HM MAMMOGRAPHY

## 2021-11-09 ENCOUNTER — Encounter: Payer: Self-pay | Admitting: Family Medicine

## 2021-11-16 ENCOUNTER — Encounter: Payer: Self-pay | Admitting: Family Medicine

## 2021-11-16 ENCOUNTER — Ambulatory Visit (INDEPENDENT_AMBULATORY_CARE_PROVIDER_SITE_OTHER): Payer: 59 | Admitting: Family Medicine

## 2021-11-16 VITALS — BP 120/72 | HR 74 | Temp 98.7°F | Ht 63.0 in | Wt 166.2 lb

## 2021-11-16 DIAGNOSIS — E782 Mixed hyperlipidemia: Secondary | ICD-10-CM

## 2021-11-16 DIAGNOSIS — E669 Obesity, unspecified: Secondary | ICD-10-CM | POA: Diagnosis not present

## 2021-11-16 MED ORDER — TIRZEPATIDE 5 MG/0.5ML ~~LOC~~ SOAJ: 5.0000 mg | SUBCUTANEOUS | 5 refills | Status: DC

## 2021-11-16 MED ORDER — TIRZEPATIDE 2.5 MG/0.5ML ~~LOC~~ SOAJ: 2.5000 mg | SUBCUTANEOUS | 0 refills | Status: DC

## 2021-11-16 NOTE — Progress Notes (Signed)
Subjective  CC:  Chief Complaint  Patient presents with   Weight Loss    Pt here for 46mo f/U on wt loss. April's wt was at 189.0lbs    HPI: CSADIE HAZELETTis a 61y.o. female who presents to the office today to address the problems listed above in the chief complaint.  Wt Readings from Last 3 Encounters:  11/16/21 166 lb 3.2 oz (75.4 kg)  08/22/21 189 lb (85.7 kg)  08/16/21 190 lb (86.2 kg)    Doing well on mounjaro. Paying out of pocket. Down 20+ pounds in 3 months. No AE. Eating more healthy foods and portions. No longer with sweet cravings.  HLD: needs refill on crestor. Started in April and tolerating well. Wondering if she can come off at some point. No AE.  Lab Results  Component Value Date   CHOL 286 (H) 08/22/2021   CHOL 212 (H) 06/30/2020   CHOL 271 (H) 03/18/2019   Lab Results  Component Value Date   HDL 69.20 08/22/2021   HDL 55.00 06/30/2020   HDL 72.10 03/18/2019   Lab Results  Component Value Date   LDLCALC 194 (H) 08/22/2021   LDLCALC 138 (H) 06/30/2020   LDLCALC 173 (H) 03/18/2019   Lab Results  Component Value Date   TRIG 111.0 08/22/2021   TRIG 91.0 06/30/2020   TRIG 132.0 03/18/2019   Lab Results  Component Value Date   CHOLHDL 4 08/22/2021   CHOLHDL 4 06/30/2020   CHOLHDL 4 03/18/2019   No results found for: "LDLDIRECT"   Assessment  1. Obesity with body mass index 30 or greater   2. Mixed hyperlipidemia      Plan  obesity:  doing very well on mounjaro. Continue at '5mg'$  weekly. Discussed risks of losing too fast. Recommend healthy diet HLD: on crestor and tolerating. Will recheck liver and lipids at next visit.   Follow up: Return in about 3 months (around 02/16/2022) for recheck.  Visit date not found  No orders of the defined types were placed in this encounter.  Meds ordered this encounter  Medications   tirzepatide (MOUNJARO) 5 MG/0.5ML Pen    Sig: Inject 5 mg into the skin once a week.    Dispense:  2 mL    Refill:   5   DISCONTD: tirzepatide (MOUNJARO) 2.5 MG/0.5ML Pen    Sig: Inject 2.5 mg into the skin once a week.    Dispense:  2 mL    Refill:  0      I reviewed the patients updated PMH, FH, and SocHx.    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   azelastine (ASTELIN) 0.1 % nasal spray Place 1 spray into both nostrils 2 (two) times daily.   rosuvastatin (CRESTOR) 10 MG tablet Take 1 tablet (10 mg total) by mouth at bedtime.   zolpidem (AMBIEN) 5 MG tablet Take 1 tablet (5 mg total) by mouth at bedtime as needed. for sleep   [DISCONTINUED] tirzepatide (MOUNJARO) 5 MG/0.5ML Pen Inject 5 mg into the skin once a week.    Allergies: Patient is allergic to sulfasalazine, oxycodone, penicillins, and sulfa  antibiotics. Family History: Patient family history includes Anxiety disorder in her mother; Depression in her mother; Diabetes in her father and mother; Heart disease in her brother, father, and mother; Hyperlipidemia in her father and mother; Hypertension in her brother and father; Obesity in her mother; Sleep apnea in her mother; Stroke in her father. Social History:  Patient  reports that she has never smoked. She has never used smokeless tobacco. She reports that she does not drink alcohol and does not use drugs.  Review of Systems: Constitutional: Negative for fever malaise or anorexia Cardiovascular: negative for chest pain Respiratory: negative for SOB or persistent cough Gastrointestinal: negative for abdominal pain  Objective  Vitals: BP 120/72   Pulse 74   Temp 98.7 F (37.1 C)   Ht '5\' 3"'$  (1.6 m)   Wt 166 lb 3.2 oz (75.4 kg)   SpO2 98%   BMI  29.44 kg/m  General: no acute distress , A&Ox3 Cardiovascular:  RRR without murmur or gallop.  Respiratory:  Good breath sounds bilaterally, CTAB with normal respiratory effort Skin:  Warm, no rashes    Commons side effects, risks, benefits, and alternatives for medications and treatment plan prescribed today were discussed, and the patient expressed understanding of the given instructions. Patient is instructed to call or message via MyChart if he/she has any questions or concerns regarding our treatment plan. No barriers to understanding were identified. We discussed Red Flag symptoms and signs in detail. Patient expressed understanding regarding what to do in case of urgent or emergency type symptoms.  Medication list was reconciled, printed and provided to the patient in AVS. Patient instructions and summary information was reviewed with the patient as documented in the AVS. This note was prepared with assistance of Dragon voice recognition software. Occasional wrong-word or sound-a-like substitutions may have occurred due to the inherent limitations of voice recognition software  This visit occurred during the SARS-CoV-2 public health emergency.  Safety protocols were in place, including screening questions prior to the visit, additional usage of staff PPE, and extensive cleaning of exam room while observing appropriate contact time as indicated for disinfecting solutions.

## 2021-11-16 NOTE — Patient Instructions (Signed)
Please return in 3 months for weight check  If you have any questions or concerns, please don't hesitate to send me a message via MyChart or call the office at [phone]. Thank you for visiting with [place] today! It's our pleasure caring for you.

## 2021-11-17 ENCOUNTER — Ambulatory Visit: Payer: 59 | Admitting: Family Medicine

## 2022-01-22 ENCOUNTER — Encounter: Payer: Self-pay | Admitting: *Deleted

## 2022-02-16 ENCOUNTER — Ambulatory Visit: Payer: 59 | Admitting: Family Medicine

## 2022-02-20 ENCOUNTER — Ambulatory Visit: Payer: 59 | Admitting: Family Medicine

## 2022-02-21 ENCOUNTER — Other Ambulatory Visit: Payer: Self-pay | Admitting: Family Medicine

## 2022-02-21 NOTE — Telephone Encounter (Signed)
Pt states: -cancelled recent appt with PCP because she fell and has a fracture. -because of pain, she's not sleeping. -will reschedule recent appointment with PCP next week     LAST APPOINTMENT DATE:   11/16/21 OV   NEXT APPOINTMENT DATE: N/A   MEDICATION: zolpidem (AMBIEN) 5 MG tablet [[id number]]   Is the patient out of medication?  Has one left   PHARMACY: [facility] at  [address]  [place], [place] [address] [address]: [address] [phone]

## 2022-02-23 ENCOUNTER — Other Ambulatory Visit: Payer: Self-pay | Admitting: Family Medicine

## 2022-02-23 NOTE — Telephone Encounter (Signed)
   LAST APPOINTMENT DATE:  Please schedule appointment if longer than 1 year  11/16/21  NEXT APPOINTMENT DATE: 03/16/22  MEDICATION: zolpidem (AMBIEN) 5 MG tablet    Is the patient out of medication? Yes   PHARMACY:  [facility] [phone] - [place], [place] - [address] [place] [address] [place] [address]   Let patient know to contact pharmacy at the end of the day to make sure medication is ready.  Please notify patient to allow 48-72 hours to process

## 2022-02-26 MED ORDER — ZOLPIDEM TARTRATE 5 MG PO TABS: 5.0000 mg | ORAL_TABLET | Freq: Every evening | ORAL | 5 refills | Status: DC | PRN

## 2022-03-16 ENCOUNTER — Encounter: Payer: Self-pay | Admitting: Family Medicine

## 2022-03-16 ENCOUNTER — Ambulatory Visit (INDEPENDENT_AMBULATORY_CARE_PROVIDER_SITE_OTHER): Payer: 59 | Admitting: Family Medicine

## 2022-03-16 VITALS — BP 170/82 | HR 67 | Temp 98.2°F | Ht 60.0 in | Wt 148.4 lb

## 2022-03-16 DIAGNOSIS — E782 Mixed hyperlipidemia: Secondary | ICD-10-CM | POA: Diagnosis not present

## 2022-03-16 DIAGNOSIS — J302 Other seasonal allergic rhinitis: Secondary | ICD-10-CM

## 2022-03-16 DIAGNOSIS — Z23 Encounter for immunization: Secondary | ICD-10-CM

## 2022-03-16 DIAGNOSIS — R030 Elevated blood-pressure reading, without diagnosis of hypertension: Secondary | ICD-10-CM

## 2022-03-16 DIAGNOSIS — E669 Obesity, unspecified: Secondary | ICD-10-CM

## 2022-03-16 DIAGNOSIS — S329XXD Fracture of unspecified parts of lumbosacral spine and pelvis, subsequent encounter for fracture with routine healing: Secondary | ICD-10-CM

## 2022-03-16 LAB — COMPREHENSIVE METABOLIC PANEL
ALT: 32 U/L (ref 0–35)
AST: 30 U/L (ref 0–37)
Albumin: 4.3 g/dL (ref 3.5–5.2)
Alkaline Phosphatase: 243 U/L — ABNORMAL HIGH (ref 39–117)
BUN: 7 mg/dL (ref 6–23)
CO2: 31 mEq/L (ref 19–32)
Calcium: 9.3 mg/dL (ref 8.4–10.5)
Chloride: 100 mEq/L (ref 96–112)
Creatinine, Ser: 0.75 mg/dL (ref 0.40–1.20)
GFR: 84.76 mL/min (ref >60.00)
Glucose, Bld: 87 mg/dL (ref 70–99)
Potassium: 4.1 mEq/L (ref 3.5–5.1)
Sodium: 139 mEq/L (ref 135–145)
Total Bilirubin: 0.6 mg/dL (ref 0.2–1.2)
Total Protein: 6.6 g/dL (ref 6.0–8.3)

## 2022-03-16 LAB — LIPID PANEL
Cholesterol: 232 mg/dL — ABNORMAL HIGH (ref 0–200)
HDL: 72 mg/dL (ref >39.00)
LDL Cholesterol: 140 mg/dL — ABNORMAL HIGH (ref 0–99)
NonHDL: 159.6
Total CHOL/HDL Ratio: 3
Triglycerides: 98 mg/dL (ref 0.0–149.0)
VLDL: 19.6 mg/dL (ref 0.0–40.0)

## 2022-03-16 MED ORDER — AZELASTINE HCL 0.1 % NA SOLN: 1.0000 | Freq: Two times a day (BID) | NASAL | 11 refills | Status: DC

## 2022-03-16 MED ORDER — FLUNISOLIDE 25 MCG/ACT (0.025%) NA SOLN: 1.0000 | Freq: Every day | NASAL | 11 refills | Status: DC

## 2022-03-16 NOTE — Patient Instructions (Signed)
Please return in April 2024 for your annual complete physical; please come fasting.   I will release your lab results to you on your MyChart account with further instructions. You may see the results before I do, but when I review them I will send you a message with my report or have my assistant call you if things need to be discussed. Please reply to my message with any questions. Thank you!   If you have any questions or concerns, please don't hesitate to send me a message via MyChart or call the office at [phone]. Thank you for visiting with [place] today! It's our pleasure caring for you.

## 2022-03-16 NOTE — Progress Notes (Signed)
Subjective  CC:  Chief Complaint  Patient presents with   Obesity    HPI: Teresa Wells is a 61 y.o. female who presents to the office today to address the problems listed above in the chief complaint. Fractured pelvis 4 weeks ago and managed by [facility]. Healing well. Still very limiting. Walking some with walker. Not fully weight bearing and can't go up and down stairs. Some pain.  Elevated bp: taken after walking to exam room and likely pain related. No h/o HTN Weight loss: has not had mounjaro for last 4 weeks but has maintained weight loss. No side effects.  Down 20 more pounds since July. She is nearing her goal.  Wt Readings from Last 3 Encounters:  03/16/22 148 lb 6.4 oz (67.3 kg)  11/16/21 166 lb 3.2 oz (75.4 kg)  08/22/21 189 lb (85.7 kg)  Seasonal allergies: needs refills on astelin and nasolide. Works well together.  Flu shot today   Assessment  1. Obesity with body mass index 30 or greater   2. Mixed hyperlipidemia   3. Elevated blood pressure reading without diagnosis of hypertension   4. Closed nondisplaced fracture of pelvis with routine healing, unspecified part of pelvis, subsequent encounter   5. Seasonal allergies      Plan  OBesity:  excellent weight loss with mounjaro. Will continue for 1-2 months more; then can consider stopping or low dose for maintenance. Recheck fasting lipids on crestor. With lfts Refilled allergy nasal sprays Will monitor bp at home. Pain related elevated response today Fractured pelvis mgt per ortho.  Flu shot today  Follow up: April 2024 for cpe  Visit date not found  Orders Placed This Encounter  Procedures   Lipid panel   Comprehensive metabolic panel   Meds ordered this encounter  Medications   azelastine (ASTELIN) 0.1 % nasal spray    Sig: Place 1 spray into both nostrils 2 (two) times daily.    Dispense:  30 mL    Refill:  11   flunisolide (NASALIDE) 25 MCG/ACT (0.025%) SOLN    Sig: Place 1 spray into  the nose daily.    Dispense:  25 mL    Refill:  11    REFILL      I reviewed the patients updated PMH, FH, and SocHx.    Patient Active Problem List   Diagnosis Date Noted   Elevated alkaline phosphatase level 03/18/2019   Obesity with body mass index 30 or greater 07/10/2017   Fibroadenoma of left breast 07/10/2017   Insomnia 10/03/2015   Seasonal allergies 05/29/2012   Mixed hyperlipidemia 09/12/2011   Current Meds  Medication Sig   acetaminophen (TYLENOL) 325 MG tablet Take 650 mg by mouth every 6 (six) hours as needed.   rosuvastatin (CRESTOR) 10 MG tablet Take 1 tablet (10 mg total) by mouth at bedtime.   tirzepatide [facility]) 5 MG/0.5ML Pen Inject 5 mg into the skin once a week.   zolpidem (AMBIEN) 5 MG tablet Take 1 tablet (5 mg total) by mouth at bedtime as needed. for sleep   [DISCONTINUED] azelastine (ASTELIN) 0.1 % nasal spray Place 1 spray into both nostrils 2 (two) times daily.    Allergies: Patient is allergic to sulfasalazine, oxycodone, penicillins, and sulfa antibiotics. Family History: Patient family history includes Anxiety disorder in her mother; Depression in her mother; Diabetes in her father and mother; Heart disease in her brother, father, and mother; Hyperlipidemia in her father and mother; Hypertension in her brother  and father; Obesity in her mother; Sleep apnea in her mother; Stroke in her father. Social History:  Patient  reports that she has never smoked. She has never used smokeless tobacco. She reports that she does not drink alcohol and does not use drugs.  Review of Systems: Constitutional: Negative for fever malaise or anorexia Cardiovascular: negative for chest pain Respiratory: negative for SOB or persistent cough Gastrointestinal: negative for abdominal pain  Objective  Vitals: BP (!) 170/82   Pulse 67   Temp 98.2 F (36.8 C)   Ht 5' (1.524 m)   Wt 148 lb 6.4 oz (67.3 kg)   SpO2 98%   BMI 28.98 kg/m  General: no acute distress ,  A&Ox3   Commons side effects, risks, benefits, and alternatives for medications and treatment plan prescribed today were discussed, and the patient expressed understanding of the given instructions. Patient is instructed to call or message via MyChart if he/she has any questions or concerns regarding our treatment plan. No barriers to understanding were identified. We discussed Red Flag symptoms and signs in detail. Patient expressed understanding regarding what to do in case of urgent or emergency type symptoms.  Medication list was reconciled, printed and provided to the patient in AVS. Patient instructions and summary information was reviewed with the patient as documented in the AVS. This note was prepared with assistance of Dragon voice recognition software. Occasional wrong-word or sound-a-like substitutions may have occurred due to the inherent limitations of voice recognition software  This visit occurred during the SARS-CoV-2 public health emergency.  Safety protocols were in place, including screening questions prior to the visit, additional usage of staff PPE, and extensive cleaning of exam room while observing appropriate contact time as indicated for disinfecting solutions.

## 2022-03-20 MED ORDER — ROSUVASTATIN CALCIUM 20 MG PO TABS: 20.0000 mg | ORAL_TABLET | Freq: Every evening | ORAL | 3 refills | Status: DC

## 2022-03-20 NOTE — Addendum Note (Signed)
Addended by: Billey Chang on: 03/20/2022 05:06 PM   Modules accepted: Orders

## 2022-04-19 DIAGNOSIS — M79605 Pain in left leg: Secondary | ICD-10-CM | POA: Insufficient documentation

## 2022-04-19 DIAGNOSIS — M6281 Muscle weakness (generalized): Secondary | ICD-10-CM | POA: Insufficient documentation

## 2022-05-23 ENCOUNTER — Other Ambulatory Visit: Payer: Self-pay | Admitting: Family Medicine

## 2022-06-16 ENCOUNTER — Other Ambulatory Visit: Payer: Self-pay | Admitting: Family Medicine

## 2022-06-18 ENCOUNTER — Other Ambulatory Visit: Payer: Self-pay | Admitting: Family Medicine

## 2022-06-18 ENCOUNTER — Other Ambulatory Visit (HOSPITAL_COMMUNITY): Payer: Self-pay

## 2022-06-18 NOTE — Telephone Encounter (Signed)
Insurance will only pay for mounjaro for type 2 diabetes. No PA submitted at this time, please change if appropriate.

## 2022-06-18 NOTE — Telephone Encounter (Addendum)
ERROR

## 2022-06-20 ENCOUNTER — Other Ambulatory Visit (HOSPITAL_COMMUNITY): Payer: Self-pay

## 2022-06-20 NOTE — Telephone Encounter (Signed)
Teresa Wells will only be covered with a dx of type 2 diabetes. Please change medication if appropriate.

## 2022-06-29 NOTE — Telephone Encounter (Signed)
Teresa Wells will only be covered with a dx of type 2 diabetes. Please change medication if appropriate.

## 2022-07-02 ENCOUNTER — Other Ambulatory Visit: Payer: Self-pay | Admitting: Family Medicine

## 2022-07-03 ENCOUNTER — Other Ambulatory Visit: Payer: Self-pay

## 2022-07-03 ENCOUNTER — Telehealth: Payer: Self-pay

## 2022-07-03 ENCOUNTER — Other Ambulatory Visit: Payer: Self-pay | Admitting: Family Medicine

## 2022-07-03 MED ORDER — MOUNJARO 5 MG/0.5ML ~~LOC~~ SOAJ: SUBCUTANEOUS | 5 refills | Status: DC

## 2022-07-03 NOTE — Telephone Encounter (Signed)
Spoke with pt and she stated that she has been off of rosuvastatin for the past 3 1/2 weeks bc she could not sleep, having leg pain and night sweats.  Please Advise

## 2022-07-04 ENCOUNTER — Other Ambulatory Visit: Payer: Self-pay | Admitting: Family Medicine

## 2022-07-04 NOTE — Telephone Encounter (Signed)
Message has been sent thru MyChart to address the  rosuvastatin

## 2022-07-05 ENCOUNTER — Other Ambulatory Visit: Payer: Self-pay | Admitting: Family Medicine

## 2022-08-06 ENCOUNTER — Other Ambulatory Visit: Payer: Self-pay | Admitting: Family Medicine

## 2022-08-07 NOTE — Telephone Encounter (Signed)
Last refill: 02/26/22 #30, 5 Last OV: 03/16/22 dx. obesity

## 2022-08-27 ENCOUNTER — Ambulatory Visit (INDEPENDENT_AMBULATORY_CARE_PROVIDER_SITE_OTHER): Payer: 59 | Admitting: Family Medicine

## 2022-08-27 ENCOUNTER — Encounter: Payer: Self-pay | Admitting: Family Medicine

## 2022-08-27 VITALS — BP 138/72 | HR 68 | Temp 98.0°F | Ht 60.0 in | Wt 138.8 lb

## 2022-08-27 DIAGNOSIS — Z0000 Encounter for general adult medical examination without abnormal findings: Secondary | ICD-10-CM

## 2022-08-27 DIAGNOSIS — Z1211 Encounter for screening for malignant neoplasm of colon: Secondary | ICD-10-CM | POA: Diagnosis not present

## 2022-08-27 DIAGNOSIS — Z1212 Encounter for screening for malignant neoplasm of rectum: Secondary | ICD-10-CM | POA: Diagnosis not present

## 2022-08-27 DIAGNOSIS — E669 Obesity, unspecified: Secondary | ICD-10-CM

## 2022-08-27 DIAGNOSIS — R748 Abnormal levels of other serum enzymes: Secondary | ICD-10-CM | POA: Diagnosis not present

## 2022-08-27 DIAGNOSIS — E782 Mixed hyperlipidemia: Secondary | ICD-10-CM

## 2022-08-27 DIAGNOSIS — F5101 Primary insomnia: Secondary | ICD-10-CM | POA: Diagnosis not present

## 2022-08-27 DIAGNOSIS — R7989 Other specified abnormal findings of blood chemistry: Secondary | ICD-10-CM | POA: Diagnosis not present

## 2022-08-27 LAB — COMPREHENSIVE METABOLIC PANEL
ALT: 48 U/L — ABNORMAL HIGH (ref 0–35)
AST: 38 U/L — ABNORMAL HIGH (ref 0–37)
Albumin: 4 g/dL (ref 3.5–5.2)
Alkaline Phosphatase: 142 U/L — ABNORMAL HIGH (ref 39–117)
BUN: 13 mg/dL (ref 6–23)
CO2: 31 mEq/L (ref 19–32)
Calcium: 8.9 mg/dL (ref 8.4–10.5)
Chloride: 101 mEq/L (ref 96–112)
Creatinine, Ser: 0.72 mg/dL (ref 0.40–1.20)
GFR: 88.74 mL/min (ref >60.00)
Glucose, Bld: 88 mg/dL (ref 70–99)
Potassium: 4.1 mEq/L (ref 3.5–5.1)
Sodium: 140 mEq/L (ref 135–145)
Total Bilirubin: 0.5 mg/dL (ref 0.2–1.2)
Total Protein: 6.1 g/dL (ref 6.0–8.3)

## 2022-08-27 LAB — CBC WITH DIFFERENTIAL/PLATELET
Basophils Absolute: 0.1 10*3/uL (ref 0.0–0.1)
Basophils Relative: 0.6 % (ref 0.0–3.0)
Eosinophils Absolute: 0.1 10*3/uL (ref 0.0–0.7)
Eosinophils Relative: 0.7 % (ref 0.0–5.0)
HCT: 40.7 % (ref 36.0–46.0)
Hemoglobin: 13.9 g/dL (ref 12.0–15.0)
Lymphocytes Relative: 30 % (ref 12.0–46.0)
Lymphs Abs: 2.7 10*3/uL (ref 0.7–4.0)
MCHC: 34.3 g/dL (ref 30.0–36.0)
MCV: 91.5 fl (ref 78.0–100.0)
Monocytes Absolute: 0.6 10*3/uL (ref 0.1–1.0)
Monocytes Relative: 6.4 % (ref 3.0–12.0)
Neutro Abs: 5.6 10*3/uL (ref 1.4–7.7)
Neutrophils Relative %: 62.3 % (ref 43.0–77.0)
Platelets: 254 10*3/uL (ref 150.0–400.0)
RBC: 4.44 Mil/uL (ref 3.87–5.11)
RDW: 13.5 % (ref 11.5–15.5)
WBC: 9 10*3/uL (ref 4.0–10.5)

## 2022-08-27 LAB — LIPID PANEL
Cholesterol: 249 mg/dL — ABNORMAL HIGH (ref 0–200)
HDL: 56.3 mg/dL (ref >39.00)
LDL Cholesterol: 169 mg/dL — ABNORMAL HIGH (ref 0–99)
NonHDL: 192.98
Total CHOL/HDL Ratio: 4
Triglycerides: 121 mg/dL (ref 0.0–149.0)
VLDL: 24.2 mg/dL (ref 0.0–40.0)

## 2022-08-27 LAB — TSH: TSH: 1.85 u[IU]/mL (ref 0.35–5.50)

## 2022-08-27 NOTE — Patient Instructions (Signed)
Please return for your pap smear.   I will release your lab results to you on your MyChart account with further instructions. You may see the results before I do, but when I review them I will send you a message with my report or have my assistant call you if things need to be discussed. Please reply to my message with any questions. Thank you!   We will call you with information regarding your referral appointment. Dr. Myrtie Neither for colonoscopy If you do not hear from [place] within the next 2 weeks, please let me know. It can take 1-2 weeks to get appointments set up with the specialists.    If you have any questions or concerns, please don't hesitate to send me a message via MyChart or call the office at [phone]. Thank you for visiting with [place] today! It's our pleasure caring for you.   Please do these things to maintain good health!  Exercise at least 30-45 minutes a day,  4-5 days a week.  Eat a low-fat diet with lots of fruits and vegetables, up to 7-9 servings per day. Drink plenty of water daily. Try to drink 8 8oz glasses per day. Seatbelts can save your life. Always wear your seatbelt. Place Smoke Detectors on every level of your home and check batteries every year. Schedule an appointment with an eye doctor for an eye exam every 1-2 years Safe sex - use condoms to protect yourself from STDs if you could be exposed to these types of infections. Use birth control if you do not want to become pregnant and are sexually active. Avoid heavy alcohol use. If you drink, keep it to less than 2 drinks/day and not every day. Health Care Power of Attorney.  Choose someone you trust that could speak for you if you became unable to speak for yourself. Depression is common in our stressful world.If you're feeling down or losing interest in things you normally enjoy, please come in for a visit. If anyone is threatening or hurting you, please get help. Physical or Emotional Violence is never OK.

## 2022-08-27 NOTE — Progress Notes (Signed)
Subjective  Chief Complaint  Patient presents with   Annual Exam    Pt here for Annual Exam and is currently fasting    Obesity    HPI: Teresa Wells is a 62 y.o. female who presents to [facility] [place] today for a Female Wellness Visit. She also has the concerns and/or needs as listed above in the chief complaint. These will be addressed in addition to the Health Maintenance Visit.   Wellness Visit: annual visit with health maintenance review and exam without Pap  Health maintenance: Patient due for Pap smear.  She will make another appointment for that.  Also due for colonoscopy, routine screening.  Has had normal in the past.  Request to see Dr. Myrtie Neither.  Mammogram scheduled and due in July.  Very happy and doing well.  Plans to retire at the end of July.  Immunizations are current Chronic disease f/u and/or acute problem visit: (deemed necessary to be done in addition to the wellness visit): Obesity: Patient has stopped Mounjaro 3 weeks ago.  Total weight loss, 50 pounds.  Extremely pleased.  Has made her weight loss goal.  Eating well.  Has started weight training. Primary insomnia is well-controlled on Ambien 5 mg nightly. Hyperlipidemia: Has taken Crestor but had side effect of hot flashes at night.  Would be willing to try again in the morning.  Fasting today to recheck lipids.  Should be improved with dietary changes and weight loss.  Has not tried any other statins.  Assessment  1. Annual physical exam   2. Mixed hyperlipidemia   3. Primary insomnia   4. Obesity with body mass index 30 or greater      Plan  Female Wellness Visit: Age appropriate Health Maintenance and Prevention measures were discussed with patient. Included topics are cancer screening recommendations, ways to keep healthy (see AVS) including dietary and exercise recommendations, regular eye and dental care, use of seat belts, and avoidance of moderate alcohol use and tobacco use.   Patient to return for screening cervical cancer.  Refer to GI for colonoscopy.  Mammo in July BMI: discussed patient's BMI and encouraged positive lifestyle modifications to help get to or maintain a target BMI. HM needs and immunizations were addressed and ordered. See below for orders. See HM and immunization section for updates. Routine labs and screening tests ordered including cmp, cbc and lipids where appropriate. Discussed recommendations regarding Vit D and calcium supplementation (see AVS)  Chronic disease management visit and/or acute problem visit: Insomnia is well-controlled on Ambien 5 mg nightly.  No adverse effects Obesity: 50 pound weight loss with Mounjaro and dietary changes.  Praised.  Continue healthy diet and exercise.  DC Mounjaro  Follow up: Return for Pap smear No orders of the defined types were placed in this encounter.  No orders of the defined types were placed in this encounter.     Body mass index is 27.11 kg/m. Wt Readings from Last 3 Encounters:  08/27/22 138 lb 12.8 oz (63 kg)  03/16/22 148 lb 6.4 oz (67.3 kg)  11/16/21 166 lb 3.2 oz (75.4 kg)     Patient Active Problem List   Diagnosis Date Noted   Elevated alkaline phosphatase level 03/18/2019    129 03/2019: mild; recheck was normal. Then again 179 05/2020;     Obesity with body mass index 30 or greater 07/10/2017   Fibroadenoma of left breast 07/10/2017   Insomnia 10/03/2015    Zolpidem prn, has  used nortriptyline in the past  Potential benefits of a long term benzodiazepines  use as well as potential risks  and complications were explained to the patient and were aknowledged.    Seasonal allergies 05/29/2012   Mixed hyperlipidemia 09/12/2011   Health Maintenance  Topic Date Due   COLONOSCOPY (Pts 45-51yrs Insurance coverage will need to be confirmed)  03/10/2022   PAP SMEAR-Modifier  08/29/2022   COVID-19 Vaccine (3 - Pfizer risk series) 09/12/2022 (Originally 02/15/2020)   MAMMOGRAM   11/08/2022   INFLUENZA VACCINE  11/29/2022   DTaP/Tdap/Td (3 - Td or Tdap) 02/12/2032   Hepatitis C Screening  Completed   HIV Screening  Completed   Zoster Vaccines- Shingrix  Completed   HPV VACCINES  Aged Out   Immunization History  Administered Date(s) Administered   Influenza Split 01/28/2017   Influenza,inj,Quad PF,6+ Mos 02/25/2018, 01/27/2019, 02/27/2020, 12/31/2020, 03/16/2022   Influenza-Unspecified 02/15/2014, 03/18/2015   PFIZER(Purple Top)SARS-COV-2 Vaccination 12/28/2019, 01/18/2020   Pneumococcal Conjugate-13 01/27/2019   Pneumococcal Polysaccharide-23 02/25/2018   Tdap 11/05/2011, 02/11/2022   Zoster Recombinat (Shingrix) 05/15/2019, 07/26/2019   We updated and reviewed the patient's past history in detail and it is documented below. Allergies: Patient is allergic to sulfasalazine, oxycodone, penicillins, and sulfa antibiotics. Past Medical History Patient  has a past medical history of Acute meniscal tear of knee (LEFT), Allergy, Anemia, Chronic respiratory failure with hypoxia (HCC) (07/26/2017), Constipation, and Insomnia. Past Surgical History Patient  has a past surgical history that includes Bilateral salpingoophorectomy (2009); Hysteroscopy with resectoscope (2006); Knee arthroscopy (05/04/2011); Meniscus debridement (05/04/2011); Breast fibroadenoma surgery (2001); Incisional hernia repair (N/A, 11/12/2013); Insertion of mesh (N/A, 11/12/2013); Mole removal (Left, 11/12/2013); Open reduction internal fixation (orif) distal radial fracture (Left, 08/27/2014); and Carpal tunnel release (Left, 08/27/2014). Family History: Patient family history includes Anxiety disorder in her mother; Depression in her mother; Diabetes in her father and mother; Heart disease in her brother, father, and mother; Hyperlipidemia in her father and mother; Hypertension in her brother and father; Obesity in her mother; Sleep apnea in her mother; Stroke in her father. Social History:  Patient  reports  that she has never smoked. She has never used smokeless tobacco. She reports that she does not drink alcohol and does not use drugs.  Review of Systems: Constitutional: negative for fever or malaise Ophthalmic: negative for photophobia, double vision or loss of vision Cardiovascular: negative for chest pain, dyspnea on exertion, or new LE swelling Respiratory: negative for SOB or persistent cough Gastrointestinal: negative for abdominal pain, change in bowel habits or melena Genitourinary: negative for dysuria or gross hematuria, no abnormal uterine bleeding or disharge Musculoskeletal: negative for new gait disturbance or muscular weakness Integumentary: negative for new or persistent rashes, no breast lumps Neurological: negative for TIA or stroke symptoms Psychiatric: negative for SI or delusions Allergic/Immunologic: negative for hives  Patient Care Team    Relationship Specialty Notifications Start End  Willow Ora, MD PCP - General Family Medicine  06/26/17   Silverio Lay, MD Consulting Physician Obstetrics and Gynecology  09/03/17   Julio Sicks, NP Nurse Practitioner Pulmonary Disease  09/03/17   Arlyce Harman, MD Consulting Physician Sports Medicine  03/16/22     Objective  Vitals: BP 138/72   Pulse 68   Temp 98 F (36.7 C)   Ht 5' (1.524 m)   Wt 138 lb 12.8 oz (63 kg)   SpO2 99%   BMI 27.11 kg/m  General:  Well developed, well nourished, no acute distress  Psych:  Alert and orientedx3,normal mood and affect HEENT:  Normocephalic, atraumatic, non-icteric sclera,  supple neck without adenopathy, mass or thyromegaly Cardiovascular:  Normal S1, S2, RRR without gallop, rub or murmur Respiratory:  Good breath sounds bilaterally, CTAB with normal respiratory effort Gastrointestinal: normal bowel sounds, soft, non-tender, no noted masses. No HSM MSK: extremities without edema, joints without erythema or swelling Neurologic:    Mental status is normal.  Gross motor  and sensory exams are normal.  No tremor  Commons side effects, risks, benefits, and alternatives for medications and treatment plan prescribed today were discussed, and the patient expressed understanding of the given instructions. Patient is instructed to call or message via MyChart if he/she has any questions or concerns regarding our treatment plan. No barriers to understanding were identified. We discussed Red Flag symptoms and signs in detail. Patient expressed understanding regarding what to do in case of urgent or emergency type symptoms.  Medication list was reconciled, printed and provided to the patient in AVS. Patient instructions and summary information was reviewed with the patient as documented in the AVS. This note was prepared with assistance of Dragon voice recognition software. Occasional wrong-word or sound-a-like substitutions may have occurred due to the inherent limitations of voice recognition software

## 2022-08-29 ENCOUNTER — Telehealth: Payer: Self-pay | Admitting: Family Medicine

## 2022-08-29 ENCOUNTER — Encounter: Payer: Self-pay | Admitting: Family Medicine

## 2022-08-29 DIAGNOSIS — R7989 Other specified abnormal findings of blood chemistry: Secondary | ICD-10-CM | POA: Insufficient documentation

## 2022-08-29 NOTE — Addendum Note (Signed)
Addended by: Asencion Partridge on: 08/29/2022 12:10 PM   Modules accepted: Orders

## 2022-08-29 NOTE — Telephone Encounter (Signed)
Patient states she got a message from PCP and wanted to ask some follow up questions. Requests a callback.

## 2022-08-30 NOTE — Telephone Encounter (Signed)
Teresa Wells has spoken with pt

## 2022-08-30 NOTE — Telephone Encounter (Signed)
Patient called back for an update. Informed patient it could take some time before she gets a callback due to provider seeing patients throughout day. Pt opted to schedule a virtual visit to discuss labs.   Pt has been scheduled for 5/3 @ 11am w/ PCP.

## 2022-08-31 ENCOUNTER — Telehealth: Payer: 59 | Admitting: Family Medicine

## 2022-09-06 ENCOUNTER — Encounter: Payer: Self-pay | Admitting: Gastroenterology

## 2022-09-10 ENCOUNTER — Other Ambulatory Visit (INDEPENDENT_AMBULATORY_CARE_PROVIDER_SITE_OTHER): Payer: 59

## 2022-09-10 DIAGNOSIS — R7989 Other specified abnormal findings of blood chemistry: Secondary | ICD-10-CM

## 2022-09-10 DIAGNOSIS — R748 Abnormal levels of other serum enzymes: Secondary | ICD-10-CM

## 2022-09-10 LAB — GAMMA GT: GGT: 134 U/L — ABNORMAL HIGH (ref 7–51)

## 2022-09-11 ENCOUNTER — Ambulatory Visit
Admission: RE | Admit: 2022-09-11 | Discharge: 2022-09-11 | Disposition: A | Discharge status: Home / Self Care | Payer: 59 | Source: Ambulatory Visit | Attending: Family Medicine | Admitting: Family Medicine

## 2022-09-11 DIAGNOSIS — R7989 Other specified abnormal findings of blood chemistry: Secondary | ICD-10-CM

## 2022-09-11 DIAGNOSIS — R748 Abnormal levels of other serum enzymes: Secondary | ICD-10-CM

## 2022-09-12 ENCOUNTER — Other Ambulatory Visit: Payer: 59

## 2022-09-13 ENCOUNTER — Telehealth: Payer: Self-pay | Admitting: Family Medicine

## 2022-09-13 NOTE — Telephone Encounter (Signed)
Patient requests to be called to discuss Lab results she received on MyChart-she is concerned

## 2022-09-14 NOTE — Telephone Encounter (Signed)
I sent patient a MyChart note regarding her lab work findings and further evaluation.

## 2022-09-14 NOTE — Progress Notes (Signed)
See mychart note Dear Ms. Teresa Wells, I have reviewed your lab test.  Your liver test, GGT is elevated showing that there is some inflammation in your liver.  However, all other testing appears normal.  Your ultrasound appears normal as well.  There is a small hemangioma which is a blood vessel benign nodule that would not be helpful. At this point, I suspect your weight loss is the culprit.  I recommend returning in 3 months to recheck your liver test.  Avoid all Tylenol and alcohol.  Nothing further is needed at this time.  Let me know if you have any questions.  If things do not normalize, I will refer you to gastroenterology for further evaluation. Sincerely, Dr. Mardelle Matte

## 2022-09-15 LAB — HEPATITIS C ANTIBODY: Hepatitis C Ab: NONREACTIVE

## 2022-09-15 LAB — PARATHYROID HORMONE, INTACT (NO CA): PTH: 22 pg/mL (ref 16–77)

## 2022-09-15 LAB — IRON,TIBC AND FERRITIN PANEL: %SAT: 19 % (calc) (ref 16–45)

## 2022-09-15 LAB — HEPATITIS B SURFACE ANTIGEN: Hepatitis B Surface Ag: NONREACTIVE

## 2022-09-15 LAB — HEPATITIS B CORE ANTIBODY, TOTAL: Hep B Core Total Ab: NONREACTIVE

## 2022-09-18 LAB — ANTI-SMOOTH MUSCLE ANTIBODY, IGG: Actin (Smooth Muscle) Antibody (IGG): <20 U (ref <20)

## 2022-09-18 LAB — IRON,TIBC AND FERRITIN PANEL
Ferritin: 24 ng/mL (ref 16–288)
Iron: 48 ug/dL (ref 45–160)
TIBC: 251 mcg/dL (calc) (ref 250–450)

## 2022-09-18 LAB — MITOCHONDRIAL ANTIBODIES: Mitochondrial M2 Ab, IgG: <=20 U (ref <=20.0)

## 2022-09-21 ENCOUNTER — Other Ambulatory Visit: Payer: 59

## 2022-10-03 ENCOUNTER — Other Ambulatory Visit (HOSPITAL_COMMUNITY)
Admission: RE | Admit: 2022-10-03 | Discharge: 2022-10-03 | Disposition: A | Discharge status: Home / Self Care | Payer: 59 | Source: Ambulatory Visit | Attending: Family Medicine | Admitting: Family Medicine

## 2022-10-03 ENCOUNTER — Ambulatory Visit (INDEPENDENT_AMBULATORY_CARE_PROVIDER_SITE_OTHER): Payer: 59 | Admitting: Family Medicine

## 2022-10-03 ENCOUNTER — Encounter: Payer: Self-pay | Admitting: Family Medicine

## 2022-10-03 VITALS — BP 126/70 | HR 75 | Temp 98.2°F | Ht 60.0 in | Wt 143.6 lb

## 2022-10-03 DIAGNOSIS — Z124 Encounter for screening for malignant neoplasm of cervix: Secondary | ICD-10-CM | POA: Insufficient documentation

## 2022-10-03 DIAGNOSIS — J302 Other seasonal allergic rhinitis: Secondary | ICD-10-CM

## 2022-10-03 DIAGNOSIS — R7989 Other specified abnormal findings of blood chemistry: Secondary | ICD-10-CM

## 2022-10-03 DIAGNOSIS — E782 Mixed hyperlipidemia: Secondary | ICD-10-CM

## 2022-10-03 DIAGNOSIS — R748 Abnormal levels of other serum enzymes: Secondary | ICD-10-CM

## 2022-10-03 LAB — HEPATIC FUNCTION PANEL
ALT: 20 U/L (ref 0–35)
AST: 18 U/L (ref 0–37)
Albumin: 4.3 g/dL (ref 3.5–5.2)
Alkaline Phosphatase: 128 U/L — ABNORMAL HIGH (ref 39–117)
Bilirubin, Direct: 0.1 mg/dL (ref 0.0–0.3)
Total Bilirubin: 0.5 mg/dL (ref 0.2–1.2)
Total Protein: 7 g/dL (ref 6.0–8.3)

## 2022-10-03 LAB — GAMMA GT: GGT: 139 U/L — ABNORMAL HIGH (ref 7–51)

## 2022-10-03 MED ORDER — AZELASTINE HCL 0.1 % NA SOLN: 1.0000 | Freq: Two times a day (BID) | NASAL | 11 refills | Status: AC

## 2022-10-03 MED ORDER — FLUNISOLIDE 25 MCG/ACT (0.025%) NA SOLN: 1.0000 | Freq: Every day | NASAL | 11 refills | Status: AC

## 2022-10-03 NOTE — Progress Notes (Signed)
Subjective  CC:  Chief Complaint  Patient presents with   Gynecologic Exam   Allergic Rhinitis    Elevated Hepatic Enzymes    HPI: Teresa Wells is a 62 y.o. female who presents to the office today to address the problems listed above in the chief complaint. Follow-up elevated liver tests: Patient reports she was taking 1300 mg of Tylenol daily for about a year since her pelvic fracture.  She has stopped that since her physical.  She is here for follow-up of her liver test.  Initial workup was negative.  See below. Allergies are very active.  Requesting Flonase and Astelin.  No symptoms of an infection Hyperlipidemia: Cholesterols have been worsening in spite of weight loss.  Recommend Crestor 3 times weekly but need to verify normal liver test first.  We discussed this.  Patient agrees. Cervical cancer screening: Pap due today.  Assessment  1. Cervical cancer screening   2. Seasonal allergies   3. Elevated LFTs   4. Elevated alkaline phosphatase level   5. Mixed hyperlipidemia      Plan  Cervical cancer screening: Pap with HPV testing done.  Atrophic vaginitis, asymptomatic. Seasonal allergies: Restart Flonase and Astelin Elevated LFTs: Recheck levels today.  Could be due to chronic Tylenol use.  If persistent elevated alk phos or LFTs, will consult GI.  Patient or stands agrees Colon cancer screening is scheduled with Dr. Myrtie Neither later this summer.  Follow up: April for complete physical Visit date not found  Orders Placed This Encounter  Procedures   Gamma GT   Hepatic function panel   Meds ordered this encounter  Medications   azelastine (ASTELIN) 0.1 % nasal spray    Sig: Place 1 spray into both nostrils 2 (two) times daily.    Dispense:  30 mL    Refill:  11   flunisolide (NASALIDE) 25 MCG/ACT (0.025%) SOLN    Sig: Place 1 spray into the nose daily.    Dispense:  25 mL    Refill:  11    REFILL      I reviewed the patients updated PMH, FH, and SocHx.     Patient Active Problem List   Diagnosis Date Noted   Elevated LFTs 08/29/2022   Elevated alkaline phosphatase level 03/18/2019   Obesity with body mass index 30 or greater 07/10/2017   Fibroadenoma of left breast 07/10/2017   Insomnia 10/03/2015   Seasonal allergies 05/29/2012   Mixed hyperlipidemia 09/12/2011   No outpatient medications have been marked as taking for the 10/03/22 encounter (Office Visit) with Willow Ora, MD.    Allergies: Patient is allergic to sulfasalazine, oxycodone, penicillins, and sulfa antibiotics. Family History: Patient family history includes Anxiety disorder in her mother; Depression in her mother; Diabetes in her father and mother; Heart disease in her brother, father, and mother; Hyperlipidemia in her father and mother; Hypertension in her brother and father; Obesity in her mother; Sleep apnea in her mother; Stroke in her father. Social History:  Patient  reports that she has never smoked. She has never used smokeless tobacco. She reports that she does not drink alcohol and does not use drugs.  Review of Systems: Constitutional: Negative for fever malaise or anorexia Cardiovascular: negative for chest pain Respiratory: negative for SOB or persistent cough Gastrointestinal: negative for abdominal pain  Objective  Vitals: BP 126/70   Pulse 75   Temp 98.2 F (36.8 C)   Ht 5' (1.524 m)   Wt 143 lb 9.6  oz (65.1 kg)   SpO2 97%   BMI 28.04 kg/m  General: no acute distress , A&Ox3 Pelvic Exam: Normal external genitalia, no vulvar or vaginal lesions present. Clear cervix w/o CMT. Bimanual exam reveals a nontender fundus w/o masses, nl size. No adnexal masses present. No inguinal adenopathy. A PAP smear was performed.   No visits with results within 1 Day(s) from this visit.  Latest known visit with results is:  Lab on 09/10/2022  Component Date Value Ref Range Status   Actin (Smooth Muscle) Antibody (IG* 09/10/2022 <20  <20 U Final    Mitochondrial M2 Ab, IgG 09/10/2022 <=20.0  <=20.0 U Final   GGT 09/10/2022 134 (H)  7 - 51 U/L Final   PTH 09/10/2022 22  16 - 77 pg/mL Final   Hepatitis B Surface Ag 09/10/2022 NON-REACTIVE  NON-REACTIVE Final   Hepatitis C Ab 09/10/2022 NON-REACTIVE  NON-REACTIVE Final   Hep B Core Total Ab 09/10/2022 NON-REACTIVE  NON-REACTIVE Final   Iron 09/10/2022 48  45 - 160 mcg/dL Final   TIBC 16/01/9603 251  250 - 450 mcg/dL (calc) Final   %SAT 54/12/8117 19  16 - 45 % (calc) Final   Ferritin 09/10/2022 24  16 - 288 ng/mL Final   Lab Results  Component Value Date   ALT 48 (H) 08/27/2022   AST 38 (H) 08/27/2022   ALKPHOS 142 (H) 08/27/2022   BILITOT 0.5 08/27/2022   RUQ ultrasound: IMPRESSION: 1. No cholelithiasis or sonographic evidence for acute cholecystitis. 2. 1.9 cm hemangioma right hepatic lobe.  Commons side effects, risks, benefits, and alternatives for medications and treatment plan prescribed today were discussed, and the patient expressed understanding of the given instructions. Patient is instructed to call or message via MyChart if he/she has any questions or concerns regarding our treatment plan. No barriers to understanding were identified. We discussed Red Flag symptoms and signs in detail. Patient expressed understanding regarding what to do in case of urgent or emergency type symptoms.  Medication list was reconciled, printed and provided to the patient in AVS. Patient instructions and summary information was reviewed with the patient as documented in the AVS. This note was prepared with assistance of Dragon voice recognition software. Occasional wrong-word or sound-a-like substitutions may have occurred due to the inherent limitations of voice recognition software

## 2022-10-03 NOTE — Patient Instructions (Signed)
Please return in April for complete physical  I will release your lab results to you on your MyChart account with further instructions. You may see the results before I do, but when I review them I will send you a message with my report or have my assistant call you if things need to be discussed. Please reply to my message with any questions. Thank you!   If you have any questions or concerns, please don't hesitate to send me a message via MyChart or call the office at [phone]. Thank you for visiting with [place] today! It's our pleasure caring for you.

## 2022-10-04 NOTE — Progress Notes (Signed)
See my chart note.

## 2022-10-05 LAB — CYTOLOGY - PAP
Comment: NEGATIVE
Diagnosis: NEGATIVE
High risk HPV: NEGATIVE

## 2022-10-08 NOTE — Progress Notes (Signed)
Nl pap and neg HR HPV screen; mychart note sent. Repeat in 5 years.

## 2022-10-22 ENCOUNTER — Ambulatory Visit (AMBULATORY_SURGERY_CENTER): Payer: 59

## 2022-10-22 ENCOUNTER — Encounter: Payer: Self-pay | Admitting: Gastroenterology

## 2022-10-22 VITALS — Ht 60.0 in | Wt 138.0 lb

## 2022-10-22 DIAGNOSIS — Z1211 Encounter for screening for malignant neoplasm of colon: Secondary | ICD-10-CM

## 2022-10-22 MED ORDER — SUTAB 1479-225-188 MG PO TABS
12.0000 | ORAL_TABLET | ORAL | 0 refills | Status: DC
Start: 2022-10-22 — End: 2022-11-19

## 2022-10-22 MED ORDER — NA SULFATE-K SULFATE-MG SULF 17.5-3.13-1.6 GM/177ML PO SOLN
1.0000 | Freq: Once | ORAL | 0 refills | Status: DC
Start: 2022-10-22 — End: 2022-10-22

## 2022-10-22 NOTE — Progress Notes (Signed)

## 2022-10-22 NOTE — Addendum Note (Signed)
Addended by: Jaquelyn Bitter on: 10/22/2022 08:55 AM   Modules accepted: Orders

## 2022-11-13 LAB — HM MAMMOGRAPHY

## 2022-11-14 ENCOUNTER — Encounter: Payer: Self-pay | Admitting: Family Medicine

## 2022-11-19 ENCOUNTER — Ambulatory Visit (AMBULATORY_SURGERY_CENTER): Payer: 59 | Admitting: Gastroenterology

## 2022-11-19 ENCOUNTER — Encounter: Payer: Self-pay | Admitting: Gastroenterology

## 2022-11-19 VITALS — BP 142/72 | HR 60 | Temp 97.7°F | Resp 15 | Ht 60.0 in | Wt 138.0 lb

## 2022-11-19 DIAGNOSIS — D122 Benign neoplasm of ascending colon: Secondary | ICD-10-CM

## 2022-11-19 DIAGNOSIS — Z1211 Encounter for screening for malignant neoplasm of colon: Secondary | ICD-10-CM | POA: Diagnosis present

## 2022-11-19 MED ORDER — SODIUM CHLORIDE 0.9 % IV SOLN: 500.0000 mL | INTRAVENOUS | Status: DC

## 2022-11-19 NOTE — Op Note (Signed)
[facility] Patient Name: Teresa Wells Procedure Date: 11/19/2022 7:12 AM MRN: [id number] Endoscopist: Sherilyn Cooter L. Myrtie Neither , MD, [id number] Age: 62 Referring MD:  Date of Birth: 04-03-1961 Gender: Female Account #: [id number] Procedure:                Colonoscopy Indications:              Screening for colorectal malignant neoplasm                           Last colonoscopy > 10 years ago at outside practice                            (Dr. Kinnie Scales - no report available) Medicines:                Monitored Anesthesia Care Procedure:                Pre-Anesthesia Assessment:                           - Prior to the procedure, a History and Physical                            was performed, and patient medications and                            allergies were reviewed. The patient's tolerance of                            previous anesthesia was also reviewed. The risks                            and benefits of the procedure and the sedation                            options and risks were discussed with the patient.                            All questions were answered, and informed consent                            was obtained. Prior Anticoagulants: The patient has                            taken no anticoagulant or antiplatelet agents. ASA                            Grade Assessment: II - A patient with mild systemic                            disease. After reviewing the risks and benefits,                            the patient was deemed in satisfactory condition to  undergo the procedure.                           After obtaining informed consent, the colonoscope                            was passed under direct vision. Throughout the                            procedure, the patient's blood pressure, pulse, and                            oxygen saturations were monitored continuously. The                            Olympus CF-[id number]  ([id number]) Colonoscope was                            introduced through the anus and advanced to the the                            cecum, identified by appendiceal orifice and                            ileocecal valve. The colonoscopy was performed                            without difficulty. The patient tolerated the                            procedure well. The quality of the bowel                            preparation was excellent. The ileocecal valve,                            appendiceal orifice, and rectum were photographed. Scope In: 8:09:31 AM Scope Out: 8:23:24 AM Scope Withdrawal Time: 0 hours 11 minutes 13 seconds  Total Procedure Duration: 0 hours 13 minutes 53 seconds  Findings:                 The perianal and digital rectal examinations were                            normal.                           A 5 mm polyp was found in the ascending colon. The                            polyp was sessile. The polyp was removed with a                            cold snare. Resection and retrieval were complete.  The exam was otherwise without abnormality on                            direct and retroflexion views. Complications:            No immediate complications. Estimated Blood Loss:     Estimated blood loss was minimal. Impression:               - One 5 mm polyp in the ascending colon, removed                            with a cold snare. Resected and retrieved.                           - The examination was otherwise normal on direct                            and retroflexion views. Recommendation:           - Patient has a contact number available for                            emergencies. The signs and symptoms of potential                            delayed complications were discussed with the                            patient. Return to normal activities tomorrow.                            Written discharge instructions were  provided to the                            patient.                           - Resume previous diet.                           - Continue present medications.                           - Await pathology results.                           - Repeat colonoscopy is recommended for                            surveillance. The colonoscopy date will be                            determined after pathology results from today's                            exam become available for review. Piccola Arico L. Myrtie Neither, MD 11/19/2022 8:27:41 AM This report has  been signed electronically.

## 2022-11-19 NOTE — Progress Notes (Signed)
Sedate, gd SR, tolerated procedure well, VSS, report to RN 

## 2022-11-19 NOTE — Patient Instructions (Signed)

## 2022-11-19 NOTE — Progress Notes (Signed)
History and Physical:  This patient presents for endoscopic testing for: Encounter Diagnosis  Name Primary?   Special screening for malignant neoplasms, colon Yes    Patient reports no polyps on last colonoscopy at outside practice ([facility]) > 10 years ago. - no report available Long-standing constipation.  Patient is otherwise without complaints or active issues today.   Past Medical History: Past Medical History:  Diagnosis Date   Acute meniscal tear of knee LEFT   Allergy    Anemia    Chronic respiratory failure with hypoxia (HCC) 07/26/2017   Constipation    Insomnia      Past Surgical History: Past Surgical History:  Procedure Laterality Date   BILATERAL SALPINGOOPHORECTOMY  2009   LAPAROSCOPIC-- LEFT ADNEXAL MASS   BREAST FIBROADENOMA SURGERY  2001   LEFT   CARPAL TUNNEL RELEASE Left 08/27/2014   Procedure: LEFT CARPAL TUNNEL RELEASE;  Surgeon: Dairl Ponder, MD;  Location: [facility];  Service: Orthopedics;  Laterality: Left;   HYSTEROSCOPY WITH RESECTOSCOPE  2006   MYOMECTOMY   INCISIONAL HERNIA REPAIR N/A 11/12/2013   Procedure: HERNIA REPAIR INCISIONAL;  Surgeon: Adolph Pollack, MD;  Location: [facility];  Service: General;  Laterality: N/A;   INSERTION OF MESH N/A 11/12/2013   Procedure: INSERTION OF MESH;  Surgeon: Adolph Pollack, MD;  Location: [facility];  Service: General;  Laterality: N/A;   KNEE ARTHROSCOPY  05/04/2011   Procedure: ARTHROSCOPY KNEE;  Surgeon: Javier Docker;  Location: [facility];  Service: Orthopedics;  Laterality: Left;   MENISCUS DEBRIDEMENT  05/04/2011   Procedure: DEBRIDEMENT OF MENISCUS;  Surgeon: Javier Docker;  Location: [facility];  Service: Orthopedics;  Laterality: Left;   MOLE REMOVAL Left 11/12/2013   Procedure: MOLE REMOVAL LEFT THIGH;  Surgeon: Adolph Pollack, MD;  Location: [facility];  Service: General;  Laterality: Left;   OPEN REDUCTION INTERNAL FIXATION (ORIF) DISTAL RADIAL  FRACTURE Left 08/27/2014   Procedure: OPEN REDUCTION INTERNAL FIXATION (ORIF) LEFT DISTAL RADIAL FRACTURE;  Surgeon: Dairl Ponder, MD;  Location: [facility];  Service: Orthopedics;  Laterality: Left;    Allergies: Allergies  Allergen Reactions   Sulfasalazine Hives   Oxycodone    Penicillins Hives   Sulfa Antibiotics Hives    Outpatient Meds: Current Outpatient Medications  Medication Sig Dispense Refill   azelastine (ASTELIN) 0.1 % nasal spray Place 1 spray into both nostrils 2 (two) times daily. 30 mL 11   Cetirizine HCl (ZYRTEC PO) Take by mouth.     zolpidem (AMBIEN) 5 MG tablet TAKE 1 TABLET(5 MG) BY MOUTH AT BEDTIME AS NEEDED FOR SLEEP 90 tablet 1   ascorbic acid (VITAMIN C) 1000 MG tablet Take by mouth.     Ergocalciferol (VITAMIN D2 PO) Take 1 capsule by mouth every 7 (seven) days.     flunisolide (NASALIDE) 25 MCG/ACT (0.025%) SOLN Place 1 spray into the nose daily. 25 mL 11   Current Facility-Administered Medications  Medication Dose Route Frequency Provider Last Rate Last Admin   0.9 %  sodium chloride infusion  500 mL Intravenous Continuous Charlie Pitter III, MD          ___________________________________________________________________ Objective   Exam:  BP (!) 140/71   Pulse 65   Temp 97.7 F (36.5 C)   Resp 10   Ht 5' (1.524 m)   Wt 138 lb (62.6 kg)   SpO2 90%   BMI 26.95 kg/m   CV: regular ,  S1/S2 Resp: clear to auscultation bilaterally, normal RR and effort noted GI: soft, no tenderness, with active bowel sounds.   Assessment: Encounter Diagnosis  Name Primary?   Special screening for malignant neoplasms, colon Yes     Plan: Colonoscopy   The benefits and risks of the planned procedure were described in detail with the patient or (when appropriate) their health care proxy.  Risks were outlined as including, but not limited to, bleeding, infection, perforation, adverse medication reaction leading to cardiac or pulmonary  decompensation, pancreatitis (if ERCP).  The limitation of incomplete mucosal visualization was also discussed.  No guarantees or warranties were given.  The patient is appropriate for an endoscopic procedure in the ambulatory setting.   - Teresa Jupiter, MD

## 2022-11-19 NOTE — Progress Notes (Signed)
Called to room to assist during endoscopic procedure.  Patient ID and intended procedure confirmed with present staff. Received instructions for my participation in the procedure from the performing physician.  

## 2022-11-19 NOTE — Progress Notes (Signed)
Pt's states no medical or surgical changes since previsit or office visit. 

## 2022-11-20 ENCOUNTER — Telehealth: Payer: Self-pay | Admitting: *Deleted

## 2022-11-20 NOTE — Telephone Encounter (Signed)
  Follow up Call-     11/19/2022    7:34 AM 11/19/2022    7:19 AM  Call back number  Post procedure Call Back phone  # [phone]  Permission to leave phone message Yes      Patient questions:  Do you have a fever, pain , or abdominal swelling? No. Pain Score  0 *  Have you tolerated food without any problems? Yes.    Have you been able to return to your normal activities? Yes.    Do you have any questions about your discharge instructions: Diet   No. Medications  No. Follow up visit  No.  Do you have questions or concerns about your Care? No.  Actions: * If pain score is 4 or above: No action needed, pain <4.

## 2022-11-21 ENCOUNTER — Encounter: Payer: Self-pay | Admitting: Gastroenterology

## 2023-02-18 ENCOUNTER — Telehealth: Payer: Self-pay | Admitting: Family Medicine

## 2023-02-18 NOTE — Telephone Encounter (Signed)
Prescription Request  02/18/2023  LOV: 10/03/2022  What is the name of the medication or equipment?  zolpidem (AMBIEN) 5 MG tablet   Have you contacted your pharmacy to request a refill? Yes   Which pharmacy would you like this sent to? [id number] - [place], [place] - [address] [place] [address] Phone: [phone] Fax: [phone]   Patient notified that their request is being sent to the clinical staff for review and that they should receive a response within 2 business days.   Please advise at Mobile [phone] (mobile)

## 2023-02-20 MED ORDER — ZOLPIDEM TARTRATE 5 MG PO TABS: 5.0000 mg | ORAL_TABLET | Freq: Every day | ORAL | 1 refills | Status: DC

## 2023-06-05 ENCOUNTER — Other Ambulatory Visit (HOSPITAL_COMMUNITY): Payer: Self-pay

## 2023-08-29 ENCOUNTER — Other Ambulatory Visit: Payer: Self-pay | Admitting: Family Medicine

## 2023-08-29 NOTE — Telephone Encounter (Signed)
 Copied from CRM [id number]. Topic: Clinical - Medication Refill >> Aug 29, 2023 11:22 AM Marlan Silva wrote: Most Recent Primary Care Visit:  Provider: Luevenia Saha  Department: [facility]  Visit Type: OFFICE VISIT  Date: 10/03/2022  Medication: zolpidem  (AMBIEN ) 5 MG tablet  Has the patient contacted their pharmacy? Yes (Agent: If no, request that the patient contact the pharmacy for the refill. If patient does not wish to contact the pharmacy document the reason why and proceed with request.) (Agent: If yes, when and what did the pharmacy advise?)  Is this the correct pharmacy for this prescription? Yes If no, delete pharmacy and type the correct one.  This is the patient's preferred pharmacy:  [id number] - [place], [place] - [address] [place] [address] Phone: [phone] Fax: [phone]  [facility] [address] [place] - [address] [place] [address] [place] [address] Phone: [phone] Fax: [phone]  [facility] [address], [place] - [address] [place] [address] Phone: [phone] Fax: [phone]  [id number] - [place], [place] - [address] [facility] [address] [place] [address] Phone: [phone] Fax: [phone]   Has the prescription been filled recently? Yes  Is the patient out of the medication? Yes  Has the patient been seen for an appointment in the last year OR does the patient have an upcoming appointment? Yes  Can we respond through MyChart? Yes  Agent: Please be advised that Rx refills may take up to 3 business days. We ask that you follow-up with your pharmacy.

## 2023-08-30 MED ORDER — ZOLPIDEM TARTRATE 5 MG PO TABS: 5.0000 mg | ORAL_TABLET | Freq: Every day | ORAL | 0 refills | Status: DC

## 2023-08-30 NOTE — Telephone Encounter (Signed)
 10/03/2022 LOV  02/20/2023 fill date  90/1 refill

## 2023-09-09 ENCOUNTER — Telehealth: Payer: Self-pay | Admitting: Family Medicine

## 2023-09-09 NOTE — Telephone Encounter (Signed)
 Spoke with pt, medication and she [facility]

## 2023-09-09 NOTE — Telephone Encounter (Signed)
 Copied from CRM [id number]. Topic: Clinical - Medication Refill >> Sep 09, 2023 10:14 AM Allyne Areola wrote: Medication: zolpidem  (AMBIEN ) 5 MG tablet   Has the patient contacted their pharmacy? Yes, pharmacy is calling for the request.  (Agent: If no, request that the patient contact the pharmacy for the refill. If patient does not wish to contact the pharmacy document the reason why and proceed with request.) (Agent: If yes, when and what did the pharmacy advise?)  This is the patient's preferred pharmacy:    [id number] - [place], [place] - [address] [place] [address] Phone: [phone] Fax: [phone]  Is this the correct pharmacy for this prescription? Yes If no, delete pharmacy and type the correct one.   Has the prescription been filled recently? No  Is the patient out of the medication? Yes  Has the patient been seen for an appointment in the last year OR does the patient have an upcoming appointment? Yes  Can we respond through MyChart? Yes  Agent: Please be advised that Rx refills may take up to 3 business days. We ask that you follow-up with your pharmacy.

## 2023-09-11 ENCOUNTER — Other Ambulatory Visit: Payer: Self-pay | Admitting: Family Medicine

## 2023-09-11 NOTE — Telephone Encounter (Signed)
 Copied from CRM [id number]. Topic: Clinical - Medication Refill >> Sep 11, 2023  2:33 PM Earnestine Goes B wrote: Medication: zolpidem  (AMBIEN ) 5 MG tablet   Has the patient contacted their pharmacy? Yes (Agent: If no, request that the patient contact the pharmacy for the refill. If patient does not wish to contact the pharmacy document the reason why and proceed with request.) (Agent: If yes, when and what did the pharmacy advise?)  This is the patient's preferred pharmacy:    [id number] - [place], [place] - [address] [place] [address] Phone: [phone] Fax: [phone]  Is this the correct pharmacy for this prescription? Yes If no, delete pharmacy and type the correct one.   Has the prescription been filled recently? Yes  Is the patient out of the medication? Yes  Has the patient been seen for an appointment in the last year OR does the patient have an upcoming appointment? Yes  Can we respond through MyChart? Yes  Agent: Please be advised that Rx refills may take up to 3 business days. We ask that you follow-up with your pharmacy.

## 2023-09-12 NOTE — Telephone Encounter (Signed)
 10/03/2022 LOV  08/30/2023 fill date   90/0 refills

## 2023-09-17 NOTE — Telephone Encounter (Signed)
 Pt sent a message stating that she got her Rx

## 2023-10-03 ENCOUNTER — Telehealth: Payer: Self-pay | Admitting: Family Medicine

## 2023-10-03 NOTE — Telephone Encounter (Signed)
 Copied from CRM [id number]. Topic: Appointments - Transfer of Care >> Oct 03, 2023  2:48 PM Luane Rumps D wrote: Pt is requesting to transfer FROM: Luevenia Saha, MD  Pt is requesting to transfer TO: Adelaide Holy, MD Reason for requested transfer: Patients husband is a patient of Dr. Georgia Kipper and she wants a change of pace. It is the responsibility of the team the patient would like to transfer to (Dr. Georgia Kipper, Leann Prom, MD) to reach out to the patient if for any reason this transfer is not acceptable.

## 2023-10-09 NOTE — Telephone Encounter (Signed)
Okay with me. Thanks 

## 2023-10-21 ENCOUNTER — Encounter: Payer: Self-pay | Admitting: Internal Medicine

## 2023-10-21 ENCOUNTER — Ambulatory Visit (INDEPENDENT_AMBULATORY_CARE_PROVIDER_SITE_OTHER): Admitting: Internal Medicine

## 2023-10-21 VITALS — BP 118/70 | HR 73 | Temp 98.3°F | Ht 60.0 in | Wt 161.0 lb

## 2023-10-21 DIAGNOSIS — E785 Hyperlipidemia, unspecified: Secondary | ICD-10-CM

## 2023-10-21 DIAGNOSIS — R7989 Other specified abnormal findings of blood chemistry: Secondary | ICD-10-CM | POA: Diagnosis not present

## 2023-10-21 DIAGNOSIS — J309 Allergic rhinitis, unspecified: Secondary | ICD-10-CM

## 2023-10-21 DIAGNOSIS — E669 Obesity, unspecified: Secondary | ICD-10-CM | POA: Diagnosis not present

## 2023-10-21 DIAGNOSIS — F5101 Primary insomnia: Secondary | ICD-10-CM

## 2023-10-21 DIAGNOSIS — Z8781 Personal history of (healed) traumatic fracture: Secondary | ICD-10-CM | POA: Insufficient documentation

## 2023-10-21 DIAGNOSIS — R5383 Other fatigue: Secondary | ICD-10-CM | POA: Diagnosis not present

## 2023-10-21 DIAGNOSIS — Z6831 Body mass index (BMI) 31.0-31.9, adult: Secondary | ICD-10-CM

## 2023-10-21 LAB — URINALYSIS, ROUTINE W REFLEX MICROSCOPIC
Bilirubin Urine: NEGATIVE
Ketones, ur: NEGATIVE
Nitrite: NEGATIVE
Specific Gravity, Urine: <=1.005 — AB (ref 1.000–1.030)
Total Protein, Urine: NEGATIVE
Urine Glucose: NEGATIVE
Urobilinogen, UA: 0.2 (ref 0.0–1.0)
pH: 6 (ref 5.0–8.0)

## 2023-10-21 LAB — CBC WITH DIFFERENTIAL/PLATELET
Basophils Absolute: 0.1 10*3/uL (ref 0.0–0.1)
Basophils Relative: 1 % (ref 0.0–3.0)
Eosinophils Absolute: 0.1 10*3/uL (ref 0.0–0.7)
Eosinophils Relative: 1.1 % (ref 0.0–5.0)
HCT: 44 % (ref 36.0–46.0)
Hemoglobin: 14.9 g/dL (ref 12.0–15.0)
Lymphocytes Relative: 33.9 % (ref 12.0–46.0)
Lymphs Abs: 2.4 10*3/uL (ref 0.7–4.0)
MCHC: 34 g/dL (ref 30.0–36.0)
MCV: 90.2 fl (ref 78.0–100.0)
Monocytes Absolute: 0.5 10*3/uL (ref 0.1–1.0)
Monocytes Relative: 6.9 % (ref 3.0–12.0)
Neutro Abs: 4.1 10*3/uL (ref 1.4–7.7)
Neutrophils Relative %: 57.1 % (ref 43.0–77.0)
Platelets: 241 10*3/uL (ref 150.0–400.0)
RBC: 4.87 Mil/uL (ref 3.87–5.11)
RDW: 13.2 % (ref 11.5–15.5)
WBC: 7.2 10*3/uL (ref 4.0–10.5)

## 2023-10-21 LAB — LIPID PANEL
Cholesterol: 278 mg/dL — ABNORMAL HIGH (ref 0–200)
HDL: 71.5 mg/dL (ref >39.00)
LDL Cholesterol: 194 mg/dL — ABNORMAL HIGH (ref 0–99)
NonHDL: 206.51
Total CHOL/HDL Ratio: 4
Triglycerides: 65 mg/dL (ref 0.0–149.0)
VLDL: 13 mg/dL (ref 0.0–40.0)

## 2023-10-21 LAB — COMPREHENSIVE METABOLIC PANEL WITH GFR
ALT: 20 U/L (ref 0–35)
AST: 19 U/L (ref 0–37)
Albumin: 4.2 g/dL (ref 3.5–5.2)
Alkaline Phosphatase: 87 U/L (ref 39–117)
BUN: 12 mg/dL (ref 6–23)
CO2: 30 meq/L (ref 19–32)
Calcium: 9.2 mg/dL (ref 8.4–10.5)
Chloride: 103 meq/L (ref 96–112)
Creatinine, Ser: 0.76 mg/dL (ref 0.40–1.20)
GFR: 82.5 mL/min (ref >60.00)
Glucose, Bld: 95 mg/dL (ref 70–99)
Potassium: 3.9 meq/L (ref 3.5–5.1)
Sodium: 141 meq/L (ref 135–145)
Total Bilirubin: 0.6 mg/dL (ref 0.2–1.2)
Total Protein: 7 g/dL (ref 6.0–8.3)

## 2023-10-21 LAB — TSH: TSH: 3.29 u[IU]/mL (ref 0.35–5.50)

## 2023-10-21 LAB — VITAMIN B12: Vitamin B-12: 268 pg/mL (ref 211–911)

## 2023-10-21 LAB — VITAMIN D 25 HYDROXY (VIT D DEFICIENCY, FRACTURES): VITD: 46.39 ng/mL (ref 30.00–100.00)

## 2023-10-21 MED ORDER — ZEPBOUND 2.5 MG/0.5ML ~~LOC~~ SOAJ: 2.5000 mg | SUBCUTANEOUS | 3 refills | Status: DC

## 2023-10-21 NOTE — Patient Instructions (Signed)

## 2023-10-21 NOTE — Assessment & Plan Note (Signed)
 Will try Zepbound

## 2023-10-21 NOTE — Assessment & Plan Note (Signed)
On Zyrtec 

## 2023-10-21 NOTE — Assessment & Plan Note (Signed)
 Labs

## 2023-10-21 NOTE — Assessment & Plan Note (Signed)
 H/o elevated LFTs

## 2023-10-21 NOTE — Progress Notes (Signed)
 Subjective:  Patient ID: Teresa Wells, female    DOB: 1960/05/10  Age: 63 y.o. MRN: [id number]  CC: New Patient (Initial Visit) (Pt is wanting blood work she has fasted today.)   HPI TRUC WINFREE presents for obesity, insomnia, allergies   Outpatient Medications Prior to Visit  Medication Sig Dispense Refill   ascorbic acid (VITAMIN C) 1000 MG tablet Take by mouth.     azelastine  (ASTELIN ) 0.1 % nasal spray Place 1 spray into both nostrils 2 (two) times daily. 30 mL 11   Cetirizine HCl (ZYRTEC PO) Take by mouth.     Ergocalciferol  (VITAMIN D2 PO) Take 1 capsule by mouth every 7 (seven) days.     flunisolide  (NASALIDE ) 25 MCG/ACT (0.025%) SOLN Place 1 spray into the nose daily. 25 mL 11   methocarbamol (ROBAXIN) 500 MG tablet take 1 tablet at bedtime, can take every 8 hours during the day if no significant sedating side effects     zolpidem  (AMBIEN ) 5 MG tablet Take 1 tablet (5 mg total) by mouth at bedtime. 90 tablet 0   MOUNJARO  5 MG/0.5ML Pen SMARTSIG:5 Milligram(s) Topical Once a Week     traMADol  (ULTRAM ) 50 MG tablet TAKE 1 TABLET BY MOUTH EVERY 6 HOURS FOR 5 DAYS AS NEEDED (Patient not taking: Reported on 10/21/2023)     No facility-administered medications prior to visit.    ROS: Review of Systems  Constitutional:  Positive for fatigue and unexpected weight change. Negative for activity change, appetite change and chills.  HENT:  Negative for congestion, mouth sores and sinus pressure.   Eyes:  Negative for visual disturbance.  Respiratory:  Negative for cough and chest tightness.   Gastrointestinal:  Negative for abdominal pain and nausea.  Genitourinary:  Negative for difficulty urinating, frequency and vaginal pain.  Musculoskeletal:  Negative for back pain and gait problem.  Skin:  Negative for pallor and rash.  Neurological:  Negative for dizziness, tremors, weakness, numbness and headaches.  Psychiatric/Behavioral:  Positive for sleep disturbance. Negative for  confusion and suicidal ideas.     Objective:  BP 118/70   Pulse 73   Temp 98.3 F (36.8 C) (Oral)   Ht 5' (1.524 m)   Wt 161 lb (73 kg)   SpO2 95%   BMI 31.44 kg/m   BP Readings from Last 3 Encounters:  10/21/23 118/70  11/19/22 (!) 142/72  10/03/22 126/70    Wt Readings from Last 3 Encounters:  10/21/23 161 lb (73 kg)  11/19/22 138 lb (62.6 kg)  10/22/22 138 lb (62.6 kg)    Physical Exam Constitutional:      General: She is not in acute distress.    Appearance: She is well-developed.  HENT:     Head: Normocephalic.     Right Ear: External ear normal.     Left Ear: External ear normal.     Nose: Nose normal.   Eyes:     General:        Right eye: No discharge.        Left eye: No discharge.     Conjunctiva/sclera: Conjunctivae normal.     Pupils: Pupils are equal, round, and reactive to light.   Neck:     Thyroid : No thyromegaly.     Vascular: No JVD.     Trachea: No tracheal deviation.   Cardiovascular:     Rate and Rhythm: Normal rate and regular rhythm.     Heart sounds: Normal heart sounds.  Pulmonary:  Effort: No respiratory distress.     Breath sounds: No stridor. No wheezing.  Abdominal:     General: Bowel sounds are normal. There is no distension.     Palpations: Abdomen is soft. There is no mass.     Tenderness: There is no abdominal tenderness. There is no guarding or rebound.   Musculoskeletal:        General: No tenderness.     Cervical back: Normal range of motion and neck supple. No rigidity.     Right lower leg: No edema.     Left lower leg: No edema.  Lymphadenopathy:     Cervical: No cervical adenopathy.   Skin:    Findings: No erythema or rash.   Neurological:     Mental Status: She is oriented to person, place, and time.     Cranial Nerves: No cranial nerve deficit.     Motor: No abnormal muscle tone.     Coordination: Coordination normal.     Deep Tendon Reflexes: Reflexes normal.   Psychiatric:        Behavior:  Behavior normal.        Thought Content: Thought content normal.        Judgment: Judgment normal.     Lab Results  Component Value Date   WBC 9.0 08/27/2022   HGB 13.9 08/27/2022   HCT 40.7 08/27/2022   PLT 254.0 08/27/2022   GLUCOSE 88 08/27/2022   CHOL 249 (H) 08/27/2022   TRIG 121.0 08/27/2022   HDL 56.30 08/27/2022   LDLCALC 169 (H) 08/27/2022   ALT 20 10/03/2022   AST 18 10/03/2022   NA 140 08/27/2022   K 4.1 08/27/2022   CL 101 08/27/2022   CREATININE 0.72 08/27/2022   BUN 13 08/27/2022   CO2 31 08/27/2022   TSH 1.85 08/27/2022   INR 0.94 11/06/2013   HGBA1C 5.6 08/22/2021    No results found.  Assessment & Plan:   Problem List Items Addressed This Visit     Allergic rhinitis due to allergen   On Zyrtec      Insomnia   Zolpidem  prn, has used nortriptyline  in the past  Potential benefits of a long term benzodiazepines  use as well as potential risks  and complications were explained to the patient and were aknowledged.      Obesity with body mass index 30 or greater - Primary   Will try Zepbound       Elevated LFTs   H/o elevated LFTs      Relevant Orders   TSH   Urinalysis   CBC with Differential/Platelet   Lipid panel   Comprehensive metabolic panel with GFR   Vitamin B12   VITAMIN D  25 Hydroxy (Vit-D Deficiency, Fractures)   Fatigue   Labs      Relevant Orders   TSH   Urinalysis   CBC with Differential/Platelet   Lipid panel   Comprehensive metabolic panel with GFR   Vitamin B12   VITAMIN D  25 Hydroxy (Vit-D Deficiency, Fractures)   History of pelvic fracture   Remote S/p fall from 6 ft      Other Visit Diagnoses       Dyslipidemia       Relevant Orders   CT CARDIAC SCORING ([facility] LOCATIONS ONLY)   TSH   Urinalysis   CBC with Differential/Platelet   Lipid panel   Comprehensive metabolic panel with GFR   Vitamin B12   VITAMIN D  25 Hydroxy (Vit-D Deficiency, Fractures)  Meds ordered this encounter  Medications    tirzepatide  (ZEPBOUND ) 2.5 MG/0.5ML Pen    Sig: Inject 2.5 mg into the skin once a week.    Dispense:  2 mL    Refill:  3      Follow-up: Return in about 2 months (around 12/21/2023) for a follow-up visit.  Marolyn Noel, MD

## 2023-10-21 NOTE — Assessment & Plan Note (Signed)
 Zolpidem  prn, has used nortriptyline  in the past  Potential benefits of a long term benzodiazepines  use as well as potential risks  and complications were explained to the patient and were aknowledged.

## 2023-10-21 NOTE — Assessment & Plan Note (Signed)
 Remote S/p fall from 6 ft

## 2023-10-22 ENCOUNTER — Ambulatory Visit: Payer: Self-pay | Admitting: Internal Medicine

## 2023-10-22 DIAGNOSIS — E785 Hyperlipidemia, unspecified: Secondary | ICD-10-CM

## 2023-10-28 ENCOUNTER — Ambulatory Visit
Admission: RE | Admit: 2023-10-28 | Discharge: 2023-10-28 | Disposition: A | Discharge status: Home / Self Care | Source: Ambulatory Visit | Attending: Internal Medicine | Admitting: Internal Medicine

## 2023-10-28 DIAGNOSIS — E785 Hyperlipidemia, unspecified: Secondary | ICD-10-CM

## 2023-11-03 ENCOUNTER — Other Ambulatory Visit: Payer: Self-pay | Admitting: Internal Medicine

## 2023-11-03 MED ORDER — TIRZEPATIDE-WEIGHT MANAGEMENT 2.5 MG/0.5ML ~~LOC~~ SOLN: 2.5000 mg | SUBCUTANEOUS | 4 refills | Status: AC

## 2023-11-19 LAB — HM MAMMOGRAPHY

## 2023-11-21 ENCOUNTER — Encounter: Payer: Self-pay | Admitting: Internal Medicine

## 2023-12-24 ENCOUNTER — Encounter: Payer: Self-pay | Admitting: Internal Medicine

## 2023-12-24 ENCOUNTER — Ambulatory Visit: Payer: Self-pay | Admitting: Internal Medicine

## 2023-12-24 VITALS — BP 118/70 | HR 63 | Temp 98.3°F | Ht 60.0 in | Wt 155.0 lb

## 2023-12-24 DIAGNOSIS — E669 Obesity, unspecified: Secondary | ICD-10-CM

## 2023-12-24 DIAGNOSIS — F5101 Primary insomnia: Secondary | ICD-10-CM | POA: Diagnosis not present

## 2023-12-24 DIAGNOSIS — E785 Hyperlipidemia, unspecified: Secondary | ICD-10-CM

## 2023-12-24 DIAGNOSIS — R7989 Other specified abnormal findings of blood chemistry: Secondary | ICD-10-CM

## 2023-12-24 MED ORDER — ZOLPIDEM TARTRATE 5 MG PO TABS: 5.0000 mg | ORAL_TABLET | Freq: Every day | ORAL | 0 refills | Status: DC

## 2023-12-24 NOTE — Assessment & Plan Note (Signed)
 Coronary calcium  CT score is 2.22.  Take fish oil

## 2023-12-24 NOTE — Assessment & Plan Note (Signed)
 H/o elevated LFTs due to Tylenol  use - resolved in 2025

## 2023-12-24 NOTE — Assessment & Plan Note (Signed)
 Not using Zepbound  Pt is losing wt on diet, walking

## 2023-12-24 NOTE — Assessment & Plan Note (Signed)
 Zolpidem  prn, has used nortriptyline  in the past  Potential benefits of a long term benzodiazepines  use as well as potential risks  and complications were explained to the patient and were aknowledged.

## 2023-12-24 NOTE — Progress Notes (Signed)
 Subjective:  Patient ID: Teresa Wells, female    DOB: July 31, 1960  Age: 63 y.o. MRN: [id number]  CC: Medical Management of Chronic Issues (2 MNTH F/U )   HPI Teresa Wells presents for elevated LFTs, insomnia. Not using Zepbound  Pt is losing wt on diet, walking  Outpatient Medications Prior to Visit  Medication Sig Dispense Refill   ascorbic acid (VITAMIN C) 1000 MG tablet Take by mouth.     azelastine  (ASTELIN ) 0.1 % nasal spray Place 1 spray into both nostrils 2 (two) times daily. 30 mL 11   Cetirizine HCl (ZYRTEC PO) Take by mouth.     Ergocalciferol  (VITAMIN D2 PO) Take 1 capsule by mouth every 7 (seven) days.     flunisolide  (NASALIDE ) 25 MCG/ACT (0.025%) SOLN Place 1 spray into the nose daily. 25 mL 11   methocarbamol (ROBAXIN) 500 MG tablet take 1 tablet at bedtime, can take every 8 hours during the day if no significant sedating side effects     tirzepatide  (ZEPBOUND ) 2.5 MG/0.5ML injection vial Inject 2.5 mg into the skin once a week. 2 mL 4   zolpidem  (AMBIEN ) 5 MG tablet Take 1 tablet (5 mg total) by mouth at bedtime. 90 tablet 0   traMADol  (ULTRAM ) 50 MG tablet TAKE 1 TABLET BY MOUTH EVERY 6 HOURS FOR 5 DAYS AS NEEDED (Patient not taking: Reported on 12/24/2023)     No facility-administered medications prior to visit.    ROS: Review of Systems  Objective:  BP 118/70   Pulse 63   Temp 98.3 F (36.8 C) (Oral)   Ht 5' (1.524 m)   Wt 155 lb (70.3 kg)   SpO2 97%   BMI 30.27 kg/m   BP Readings from Last 3 Encounters:  12/24/23 118/70  10/21/23 118/70  11/19/22 (!) 142/72    Wt Readings from Last 3 Encounters:  12/24/23 155 lb (70.3 kg)  10/21/23 161 lb (73 kg)  11/19/22 138 lb (62.6 kg)    Physical Exam  Lab Results  Component Value Date   WBC 7.2 10/21/2023   HGB 14.9 10/21/2023   HCT 44.0 10/21/2023   PLT 241.0 10/21/2023   GLUCOSE 95 10/21/2023   CHOL 278 (H) 10/21/2023   TRIG 65.0 10/21/2023   HDL 71.50 10/21/2023   LDLCALC 194 (H)  10/21/2023   ALT 20 10/21/2023   AST 19 10/21/2023   NA 141 10/21/2023   K 3.9 10/21/2023   CL 103 10/21/2023   CREATININE 0.76 10/21/2023   BUN 12 10/21/2023   CO2 30 10/21/2023   TSH 3.29 10/21/2023   INR 0.94 11/06/2013   HGBA1C 5.6 08/22/2021    CT CARDIAC SCORING ([facility] LOCATIONS ONLY) Result Date: 10/29/2023 CLINICAL DATA:  63 year old Caucasian female with history of dyslipidemia. * Tracking Code: FCC * EXAM: CT CARDIAC CORONARY ARTERY CALCIUM  SCORE TECHNIQUE: Non-contrast imaging through the heart was performed using prospective ECG gating. Image post processing was performed on an independent workstation, allowing for quantitative analysis of the heart and coronary arteries. Note that this exam targets the heart and the chest was not imaged in its entirety. COMPARISON:  07/02/2017 FINDINGS: CORONARY CALCIUM  SCORES: Left Main: 0 LAD: 0 LCx: 0 RCA + PDA: 2.22 Total Agatston Score: 2.22 MESA database percentile: Fifty-sixth AORTA MEASUREMENTS: Ascending Aorta: 33 mm Descending Aorta: 24 mm OTHER FINDINGS: Cardiovascular: The heart is normal in size. Mild scattered aortic valvular calcifications are noted. Mediastinum/Nodes: Visualized esophagus and trachea within normal limits. No mediastinal lymphadenopathy. Lungs/Pleura: Mild  cicatricial atelectasis in the right middle lobe and lingula. The visualized lungs are otherwise clear bilaterally. Upper Abdomen: The visualized upper abdomen is within normal limits. Musculoskeletal: No acute osseous abnormality. IMPRESSION: 1. Punctate atherosclerotic calcification about the mid right coronary artery. The observed calcium  score of 2.22 is at the fifty-sixth percentile for subjects of the same age, gender, and race/ethnicity who are free of clinical cardiovascular disease and treated diabetes. 2. Mild aortic valvular calcifications. Consider correlation with echocardiography as clinically indicated. 3. Mild cicatricial atelectasis in the right middle lobe  and lingula. Teresa Sides, MD [facility] Electronically Signed   By: Teresa Wells M.D.   On: 10/29/2023 07:50    Assessment & Plan:   Problem List Items Addressed This Visit     Dyslipidemia   Coronary calcium  CT score is 2.22.  Take fish oil      Elevated LFTs   H/o elevated LFTs due to Tylenol  use - resolved in 2025      Relevant Orders   Comprehensive metabolic panel with GFR   Insomnia   Zolpidem  prn, has used nortriptyline  in the past  Potential benefits of a long term benzodiazepines  use as well as potential risks  and complications were explained to the patient and were aknowledged.      Obesity with body mass index 30 or greater - Primary   Not using Zepbound  Pt is losing wt on diet, walking         Meds ordered this encounter  Medications   zolpidem  (AMBIEN ) 5 MG tablet    Sig: Take 1 tablet (5 mg total) by mouth at bedtime.    Dispense:  90 tablet    Refill:  0      Follow-up: Return in about 6 months (around 06/25/2024) for a follow-up visit.  Teresa Noel, MD

## 2024-03-04 ENCOUNTER — Encounter: Payer: Self-pay | Admitting: Internal Medicine

## 2024-03-06 ENCOUNTER — Other Ambulatory Visit: Payer: Self-pay | Admitting: Internal Medicine

## 2024-03-06 MED ORDER — PHENTERMINE HCL 37.5 MG PO TABS: 37.5000 mg | ORAL_TABLET | Freq: Every day | ORAL | 2 refills | Status: AC

## 2024-04-20 ENCOUNTER — Other Ambulatory Visit: Payer: Self-pay | Admitting: Internal Medicine

## 2024-06-25 ENCOUNTER — Ambulatory Visit: Admitting: Internal Medicine
